# Patient Record
Sex: Male | Born: 1940
Health system: Southern US, Community
[De-identification: ages and names within clinical notes are randomized; demographics above are authoritative.]

## PROBLEM LIST (undated history)

## (undated) DIAGNOSIS — J439 Emphysema, unspecified: Secondary | ICD-10-CM

## (undated) DIAGNOSIS — D509 Iron deficiency anemia, unspecified: Secondary | ICD-10-CM

## (undated) DIAGNOSIS — S32040A Wedge compression fracture of fourth lumbar vertebra, initial encounter for closed fracture: Secondary | ICD-10-CM

## (undated) DIAGNOSIS — D4959 Neoplasm of unspecified behavior of other genitourinary organ: Secondary | ICD-10-CM

## (undated) DIAGNOSIS — I872 Venous insufficiency (chronic) (peripheral): Secondary | ICD-10-CM

## (undated) DIAGNOSIS — F17210 Nicotine dependence, cigarettes, uncomplicated: Secondary | ICD-10-CM

## (undated) DIAGNOSIS — G8929 Other chronic pain: Secondary | ICD-10-CM

## (undated) DIAGNOSIS — F1120 Opioid dependence, uncomplicated: Secondary | ICD-10-CM

## (undated) DIAGNOSIS — R600 Localized edema: Secondary | ICD-10-CM

## (undated) DIAGNOSIS — D469 Myelodysplastic syndrome, unspecified: Secondary | ICD-10-CM

## (undated) DIAGNOSIS — N200 Calculus of kidney: Secondary | ICD-10-CM

## (undated) DIAGNOSIS — M545 Low back pain, unspecified: Secondary | ICD-10-CM

## (undated) HISTORY — DX: Wedge compression fracture of fourth lumbar vertebra, initial encounter for closed fracture: S32.040A

## (undated) HISTORY — PX: SKIN CANCER EXCISION: SHX779

## (undated) HISTORY — PX: COLONOSCOPY: SHX174

---

## 1998-11-11 ENCOUNTER — Emergency Department (HOSPITAL_COMMUNITY): Admission: EM | Admit: 1998-11-11 | Discharge: 1998-11-11 | Payer: Self-pay | Admitting: Emergency Medicine

## 2016-01-12 LAB — HM COLONOSCOPY

## 2016-01-17 DIAGNOSIS — Z86010 Personal history of colonic polyps: Secondary | ICD-10-CM | POA: Diagnosis not present

## 2016-01-17 DIAGNOSIS — K5730 Diverticulosis of large intestine without perforation or abscess without bleeding: Secondary | ICD-10-CM | POA: Diagnosis not present

## 2016-01-17 DIAGNOSIS — D124 Benign neoplasm of descending colon: Secondary | ICD-10-CM | POA: Diagnosis not present

## 2016-02-05 DIAGNOSIS — Z08 Encounter for follow-up examination after completed treatment for malignant neoplasm: Secondary | ICD-10-CM | POA: Diagnosis not present

## 2016-02-05 DIAGNOSIS — L570 Actinic keratosis: Secondary | ICD-10-CM | POA: Diagnosis not present

## 2016-02-05 DIAGNOSIS — Z85828 Personal history of other malignant neoplasm of skin: Secondary | ICD-10-CM | POA: Diagnosis not present

## 2016-08-07 DIAGNOSIS — C44519 Basal cell carcinoma of skin of other part of trunk: Secondary | ICD-10-CM | POA: Diagnosis not present

## 2016-08-07 DIAGNOSIS — L570 Actinic keratosis: Secondary | ICD-10-CM | POA: Diagnosis not present

## 2016-08-07 DIAGNOSIS — D485 Neoplasm of uncertain behavior of skin: Secondary | ICD-10-CM | POA: Diagnosis not present

## 2016-08-07 DIAGNOSIS — Z08 Encounter for follow-up examination after completed treatment for malignant neoplasm: Secondary | ICD-10-CM | POA: Diagnosis not present

## 2016-08-07 DIAGNOSIS — Z85828 Personal history of other malignant neoplasm of skin: Secondary | ICD-10-CM | POA: Diagnosis not present

## 2016-08-13 DIAGNOSIS — C44519 Basal cell carcinoma of skin of other part of trunk: Secondary | ICD-10-CM | POA: Diagnosis not present

## 2017-02-05 DIAGNOSIS — L905 Scar conditions and fibrosis of skin: Secondary | ICD-10-CM | POA: Diagnosis not present

## 2017-02-05 DIAGNOSIS — C44529 Squamous cell carcinoma of skin of other part of trunk: Secondary | ICD-10-CM | POA: Diagnosis not present

## 2017-02-05 DIAGNOSIS — Z85828 Personal history of other malignant neoplasm of skin: Secondary | ICD-10-CM | POA: Diagnosis not present

## 2017-02-05 DIAGNOSIS — C44729 Squamous cell carcinoma of skin of left lower limb, including hip: Secondary | ICD-10-CM | POA: Diagnosis not present

## 2017-02-05 DIAGNOSIS — L570 Actinic keratosis: Secondary | ICD-10-CM | POA: Diagnosis not present

## 2017-02-05 DIAGNOSIS — D485 Neoplasm of uncertain behavior of skin: Secondary | ICD-10-CM | POA: Diagnosis not present

## 2017-02-17 DIAGNOSIS — C44529 Squamous cell carcinoma of skin of other part of trunk: Secondary | ICD-10-CM | POA: Diagnosis not present

## 2017-02-17 DIAGNOSIS — C44729 Squamous cell carcinoma of skin of left lower limb, including hip: Secondary | ICD-10-CM | POA: Diagnosis not present

## 2017-03-17 DIAGNOSIS — L905 Scar conditions and fibrosis of skin: Secondary | ICD-10-CM | POA: Diagnosis not present

## 2017-03-17 DIAGNOSIS — Z08 Encounter for follow-up examination after completed treatment for malignant neoplasm: Secondary | ICD-10-CM | POA: Diagnosis not present

## 2017-03-17 DIAGNOSIS — Z85828 Personal history of other malignant neoplasm of skin: Secondary | ICD-10-CM | POA: Diagnosis not present

## 2017-08-07 DIAGNOSIS — D485 Neoplasm of uncertain behavior of skin: Secondary | ICD-10-CM | POA: Diagnosis not present

## 2017-08-07 DIAGNOSIS — L905 Scar conditions and fibrosis of skin: Secondary | ICD-10-CM | POA: Diagnosis not present

## 2017-08-07 DIAGNOSIS — C44329 Squamous cell carcinoma of skin of other parts of face: Secondary | ICD-10-CM | POA: Diagnosis not present

## 2017-08-07 DIAGNOSIS — Z08 Encounter for follow-up examination after completed treatment for malignant neoplasm: Secondary | ICD-10-CM | POA: Diagnosis not present

## 2017-08-07 DIAGNOSIS — Z85828 Personal history of other malignant neoplasm of skin: Secondary | ICD-10-CM | POA: Diagnosis not present

## 2017-08-20 DIAGNOSIS — C44329 Squamous cell carcinoma of skin of other parts of face: Secondary | ICD-10-CM | POA: Diagnosis not present

## 2017-11-20 ENCOUNTER — Other Ambulatory Visit: Payer: Self-pay | Admitting: Chiropractic Medicine

## 2017-11-20 DIAGNOSIS — M545 Low back pain: Secondary | ICD-10-CM

## 2017-11-21 ENCOUNTER — Ambulatory Visit
Admission: RE | Admit: 2017-11-21 | Discharge: 2017-11-21 | Disposition: A | Discharge status: Home / Self Care | Payer: Medicare Other | Source: Ambulatory Visit | Attending: Chiropractic Medicine | Admitting: Chiropractic Medicine

## 2017-11-21 DIAGNOSIS — M5136 Other intervertebral disc degeneration, lumbar region: Secondary | ICD-10-CM | POA: Diagnosis not present

## 2017-11-21 DIAGNOSIS — M461 Sacroiliitis, not elsewhere classified: Secondary | ICD-10-CM | POA: Insufficient documentation

## 2017-11-21 DIAGNOSIS — M545 Low back pain: Secondary | ICD-10-CM | POA: Diagnosis not present

## 2017-11-21 DIAGNOSIS — M47816 Spondylosis without myelopathy or radiculopathy, lumbar region: Secondary | ICD-10-CM | POA: Diagnosis not present

## 2017-11-21 DIAGNOSIS — M4856XA Collapsed vertebra, not elsewhere classified, lumbar region, initial encounter for fracture: Secondary | ICD-10-CM | POA: Diagnosis not present

## 2017-12-01 ENCOUNTER — Telehealth: Payer: Self-pay | Admitting: Internal Medicine

## 2017-12-01 NOTE — Telephone Encounter (Signed)
Copied from [place] [id number]. Topic: Appointment Scheduling - Scheduling Inquiry for Clinic >> Dec 01, 2017  8:31 AM Darl Householder, RMA wrote: Reason for CRM: patient is requesting a emergency new pt appt but there is availability currently this week, pt states he needs a prescription for prednisone, and needs to establish care, pt states he knows Dr. Silvio Pate personally and would like a call back at [phone]

## 2017-12-02 NOTE — Telephone Encounter (Signed)
Okay to add him tomorrow at 12:15 if he can make it then

## 2017-12-02 NOTE — Telephone Encounter (Signed)
Pt aware.

## 2017-12-02 NOTE — Telephone Encounter (Signed)
12:15 has been taken do you still want to add him

## 2017-12-02 NOTE — Telephone Encounter (Signed)
Left message asking pt to call office  °

## 2017-12-02 NOTE — Telephone Encounter (Signed)
Can patient be worked in this week? See below message

## 2017-12-02 NOTE — Telephone Encounter (Signed)
Okay to try 12:30---just tell him I won't have as long as usual due to fitting him in quickly

## 2017-12-03 ENCOUNTER — Encounter: Payer: Self-pay | Admitting: Internal Medicine

## 2017-12-03 ENCOUNTER — Ambulatory Visit: Payer: Medicare Other | Admitting: Internal Medicine

## 2017-12-03 VITALS — BP 124/82 | HR 77 | Temp 98.1°F | Wt 142.0 lb

## 2017-12-03 DIAGNOSIS — R609 Edema, unspecified: Secondary | ICD-10-CM | POA: Insufficient documentation

## 2017-12-03 DIAGNOSIS — S32040A Wedge compression fracture of fourth lumbar vertebra, initial encounter for closed fracture: Secondary | ICD-10-CM | POA: Insufficient documentation

## 2017-12-03 DIAGNOSIS — I872 Venous insufficiency (chronic) (peripheral): Secondary | ICD-10-CM | POA: Insufficient documentation

## 2017-12-03 LAB — COMPREHENSIVE METABOLIC PANEL
ALK PHOS: 127 U/L — AB (ref 39–117)
ALT: 13 U/L (ref 0–53)
AST: 14 U/L (ref 0–37)
Albumin: 4.1 g/dL (ref 3.5–5.2)
BILIRUBIN TOTAL: 0.9 mg/dL (ref 0.2–1.2)
BUN: 18 mg/dL (ref 6–23)
CO2: 30 meq/L (ref 19–32)
CREATININE: 0.71 mg/dL (ref 0.40–1.50)
Calcium: 9.2 mg/dL (ref 8.4–10.5)
Chloride: 99 mEq/L (ref 96–112)
GFR: 114.26 mL/min (ref >60.00)
Glucose, Bld: 116 mg/dL — ABNORMAL HIGH (ref 70–99)
Potassium: 4.1 mEq/L (ref 3.5–5.1)
Sodium: 134 mEq/L — ABNORMAL LOW (ref 135–145)
TOTAL PROTEIN: 6.8 g/dL (ref 6.0–8.3)

## 2017-12-03 LAB — CBC
HEMATOCRIT: 31 % — AB (ref 39.0–52.0)
Hemoglobin: 10.4 g/dL — ABNORMAL LOW (ref 13.0–17.0)
MCHC: 33.6 g/dL (ref 30.0–36.0)
MCV: 101.8 fl — AB (ref 78.0–100.0)
Platelets: 285 10*3/uL (ref 150.0–400.0)
RBC: 3.05 Mil/uL — ABNORMAL LOW (ref 4.22–5.81)
RDW: 22.7 % — AB (ref 11.5–15.5)
WBC: 9.2 10*3/uL (ref 4.0–10.5)

## 2017-12-03 LAB — T4, FREE: Free T4: 1.03 ng/dL (ref 0.60–1.60)

## 2017-12-03 MED ORDER — TRAMADOL HCL 50 MG PO TABS: 50.0000 mg | ORAL_TABLET | Freq: Three times a day (TID) | ORAL | 0 refills | Status: DC | PRN

## 2017-12-03 NOTE — Assessment & Plan Note (Signed)
And 3rd Sudden pain 2 months ago when shoveling---but doesn't seem muscular and no sciatic---so may have had the fracture then Concern about the ibuprofen Will change to regular tylenol and prn tramadol Check DEXA Start vitamin D/calcium
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
## 2017-12-03 NOTE — Patient Instructions (Signed)
Please start generic tylenol arthritis three times a day and stop the ibuprofen for now. You can take the stronger pain medication (tramadol) on your bad days. Start tums and vitamin D (over the counter)

## 2017-12-05 ENCOUNTER — Telehealth: Payer: Self-pay

## 2017-12-05 NOTE — Telephone Encounter (Signed)
Left another message for the pt to call. I need to speak to him directly. Thanks

## 2017-12-05 NOTE — Telephone Encounter (Signed)
Copied from [place]. Topic: Inquiry >> Dec 04, 2017  5:44 PM Oliver Pila B wrote: Reason for CRM: pt called the office to speak w/ Larene Beach, pt will call the office tomorrow

## 2017-12-09 ENCOUNTER — Telehealth: Payer: Self-pay | Admitting: Internal Medicine

## 2017-12-09 NOTE — Telephone Encounter (Signed)
Please let him know that you don't need an MRI to see cancer in a bone---it is usually apparent in just a plain film---which he had a couple of weeks ago. If he wants, we can recheck this to be sure it hasn't worsened

## 2017-12-09 NOTE — Telephone Encounter (Signed)
He said his back pain varies from day to day. His biggest issue is getting up in the mornings or to get up from sitting for a long time. Stephen Bennett he has to think before getting up because he doesn't want to cause any pain. Does not want to take pain medication. He has been in this pain for 2 months now.  He is thinking he may need to be seen before his appointment 01-05-18.

## 2017-12-09 NOTE — Telephone Encounter (Signed)
Copied from [place] [id number]. Topic: Quick Communication - See Telephone Encounter >> Dec 09, 2017  9:07 AM Percell Belt A wrote: CRM for notification. See Telephone encounter for:  pt called in and said that he is in a lot of pain and he would like to know If Dr Silvio Pate could order a MRI on his back to see what is going on.  He would like to have it ASAP.  He said that his mother had a tumor in her back that ended up being cancer and he would like to get that checked out to just make sure   Best number [phone]  12/09/17.

## 2017-12-09 NOTE — Telephone Encounter (Signed)
Pt seen 12/03/17.

## 2017-12-10 NOTE — Telephone Encounter (Signed)
This could either be related to the vetebral fracture, or perhaps sciatica as well. Okay to schedule him sooner if he wishes---but if he doesn't want medications, not sure what else I can do. If he would like, I can refer him for PT now---and keep next month's appt

## 2017-12-10 NOTE — Telephone Encounter (Signed)
Spoke to pt. Cancelled the 01-05-18 appt and moved it to 12-19-17

## 2017-12-10 NOTE — Telephone Encounter (Signed)
Spoke to pt. He wants to be seen sooner. I will call him back this afternoon to schedule his appt

## 2017-12-17 ENCOUNTER — Ambulatory Visit: Payer: Medicare Other | Admitting: Internal Medicine

## 2017-12-17 ENCOUNTER — Encounter: Payer: Self-pay | Admitting: Internal Medicine

## 2017-12-17 VITALS — BP 104/70 | HR 81 | Temp 97.7°F | Wt 139.0 lb

## 2017-12-17 DIAGNOSIS — M545 Low back pain, unspecified: Secondary | ICD-10-CM | POA: Insufficient documentation

## 2017-12-17 DIAGNOSIS — G8929 Other chronic pain: Secondary | ICD-10-CM | POA: Diagnosis not present

## 2017-12-17 MED ORDER — TRAMADOL HCL 50 MG PO TABS: 50.0000 mg | ORAL_TABLET | Freq: Three times a day (TID) | ORAL | 0 refills | Status: DC | PRN

## 2017-12-17 NOTE — Progress Notes (Signed)
   Subjective:    Patient ID: Stephen Bennett, male    DOB: 1941-07-10, 77 y.o.   MRN: [id number]  HPI Here for worsening back pain  Still with some ankle swelling--especially on left Left low back pain---occasionally over to the right Hardest time is getting out of bed--needs to use hands and arms to get up Using tramadol first in AM--and this helps. Occasionally takes a second No radiation to legs--but may get slight "feeling" in upper left thigh No leg weakness  Did start milk again and tums And vitamin D   Review of Systems No loss of control in bowel or bladder Using miralax for constipation    Objective:   Physical Exam  Constitutional: No distress.  HENT:  2 apparent cysts on vertex of head  Musculoskeletal:  Mild tenderness in mid low back--not specifically spinal Very stiff and limited flexion SLR negative bilaterally Normal ROM in both hips  Neurological:  No focal leg weakness Gait is slow and short steps but not antalgic Trouble getting up and down from table and chair           Assessment & Plan:

## 2017-12-17 NOTE — Assessment & Plan Note (Signed)
Now 3 months of pain or so He is very concerned and wants to see specialist (neurosurgeon) but presentation doesn't suggest HNP or spinal stenosis Very stiff but no preceding problems to suggest ankylosing spondylitis Could still have pain from lumbar fracture but also may be more soft tissue Discussed PT but he wants to see a specialist Will set up with physiatry

## 2017-12-18 ENCOUNTER — Telehealth: Payer: Self-pay

## 2017-12-18 NOTE — Telephone Encounter (Signed)
Thank you for the information.

## 2017-12-18 NOTE — Telephone Encounter (Signed)
Copied from [place]. Topic: Inquiry >> Dec 18, 2017  9:06 AM Conception Chancy, NT wrote: Patient states he was seen yesterday 12/17/17 by [facility] and wants to let him know that he has been set up with [facility] for 12/22/17 at 3:45.

## 2017-12-19 ENCOUNTER — Ambulatory Visit: Payer: Medicare Other | Admitting: Internal Medicine

## 2017-12-22 DIAGNOSIS — M5136 Other intervertebral disc degeneration, lumbar region: Secondary | ICD-10-CM | POA: Diagnosis not present

## 2017-12-22 DIAGNOSIS — S32040A Wedge compression fracture of fourth lumbar vertebra, initial encounter for closed fracture: Secondary | ICD-10-CM | POA: Diagnosis not present

## 2018-01-01 ENCOUNTER — Ambulatory Visit
Admission: RE | Admit: 2018-01-01 | Discharge: 2018-01-01 | Disposition: A | Discharge status: Home / Self Care | Payer: Medicare Other | Source: Ambulatory Visit | Attending: Internal Medicine | Admitting: Internal Medicine

## 2018-01-01 DIAGNOSIS — Z13820 Encounter for screening for osteoporosis: Secondary | ICD-10-CM | POA: Insufficient documentation

## 2018-01-01 DIAGNOSIS — M810 Age-related osteoporosis without current pathological fracture: Secondary | ICD-10-CM | POA: Insufficient documentation

## 2018-01-01 DIAGNOSIS — S32040A Wedge compression fracture of fourth lumbar vertebra, initial encounter for closed fracture: Secondary | ICD-10-CM

## 2018-01-05 ENCOUNTER — Ambulatory Visit: Payer: Medicare Other | Admitting: Internal Medicine

## 2018-01-19 DIAGNOSIS — S32040G Wedge compression fracture of fourth lumbar vertebra, subsequent encounter for fracture with delayed healing: Secondary | ICD-10-CM | POA: Diagnosis not present

## 2018-01-19 DIAGNOSIS — S32030G Wedge compression fracture of third lumbar vertebra, subsequent encounter for fracture with delayed healing: Secondary | ICD-10-CM | POA: Diagnosis not present

## 2018-01-19 DIAGNOSIS — M47816 Spondylosis without myelopathy or radiculopathy, lumbar region: Secondary | ICD-10-CM | POA: Diagnosis not present

## 2018-02-03 ENCOUNTER — Encounter: Payer: Self-pay | Admitting: Internal Medicine

## 2018-02-03 ENCOUNTER — Ambulatory Visit: Payer: Medicare Other | Admitting: Internal Medicine

## 2018-02-03 VITALS — BP 108/54 | HR 80 | Temp 98.3°F | Wt 138.2 lb

## 2018-02-03 DIAGNOSIS — R609 Edema, unspecified: Secondary | ICD-10-CM

## 2018-02-03 DIAGNOSIS — D649 Anemia, unspecified: Secondary | ICD-10-CM | POA: Diagnosis not present

## 2018-02-03 DIAGNOSIS — S32040D Wedge compression fracture of fourth lumbar vertebra, subsequent encounter for fracture with routine healing: Secondary | ICD-10-CM

## 2018-02-03 LAB — IBC PANEL
Iron: 92 ug/dL (ref 42–165)
Saturation Ratios: 38.7 % (ref 20.0–50.0)
TRANSFERRIN: 170 mg/dL — AB (ref 212.0–360.0)

## 2018-02-03 LAB — CBC WITH DIFFERENTIAL/PLATELET
BASOS ABS: 0.1 10*3/uL (ref 0.0–0.1)
BASOS PCT: 0.9 % (ref 0.0–3.0)
EOS PCT: 0.5 % (ref 0.0–5.0)
Eosinophils Absolute: 0 10*3/uL (ref 0.0–0.7)
HEMATOCRIT: 30.3 % — AB (ref 39.0–52.0)
Hemoglobin: 10.4 g/dL — ABNORMAL LOW (ref 13.0–17.0)
LYMPHS ABS: 2.5 10*3/uL (ref 0.7–4.0)
LYMPHS PCT: 31.1 % (ref 12.0–46.0)
MCHC: 34.3 g/dL (ref 30.0–36.0)
MCV: 102.5 fl — AB (ref 78.0–100.0)
MONOS PCT: 8.4 % (ref 3.0–12.0)
Monocytes Absolute: 0.7 10*3/uL (ref 0.1–1.0)
NEUTROS ABS: 4.7 10*3/uL (ref 1.4–7.7)
NEUTROS PCT: 59.1 % (ref 43.0–77.0)
PLATELETS: 204 10*3/uL (ref 150.0–400.0)
RBC: 2.96 Mil/uL — ABNORMAL LOW (ref 4.22–5.81)
RDW: 24 % — AB (ref 11.5–15.5)
WBC: 7.9 10*3/uL (ref 4.0–10.5)

## 2018-02-03 LAB — FERRITIN: Ferritin: 695.7 ng/mL — ABNORMAL HIGH (ref 22.0–322.0)

## 2018-02-03 LAB — B12 AND FOLATE PANEL
Folate: 18.5 ng/mL (ref >5.9)
VITAMIN B 12: 606 pg/mL (ref 211–911)

## 2018-02-03 MED ORDER — TRAMADOL HCL 50 MG PO TABS: 50.0000 mg | ORAL_TABLET | Freq: Three times a day (TID) | ORAL | 0 refills | Status: DC | PRN

## 2018-02-03 NOTE — Progress Notes (Signed)
Subjective:    Patient ID: Stephen Bennett, male    DOB: 16-Jul-1941, 77 y.o.   MRN: [id number]  HPI Here for follow up of severe back pain This has improved--not as bad Still gets sore and stiff with sitting---across upper lumbar areas Bad pain along the sides is better  Using the tramadol first thing in the morning ---or 1/2 Not using any the rest of the day Is on the calcium  Ongoing ankle edema Does tend to be better in the morning Able to get back out to do some yard work--so on his feet more Breathing is okay No chest pain  Current Outpatient Medications on File Prior to Visit  Medication Sig Dispense Refill  . calcium carbonate (TUMS EX) 750 MG chewable tablet Chew 2 tablets by mouth daily.    . cholecalciferol (VITAMIN D) 1000 units tablet Take 1,000 Units by mouth daily.    . traMADol (ULTRAM) 50 MG tablet Take 1 tablet (50 mg total) by mouth 3 (three) times daily as needed. 60 tablet 0  . acetaminophen (TYLENOL) 650 MG CR tablet Take 650 mg by mouth 3 (three) times daily.     No current facility-administered medications on file prior to visit.     No Known Allergies  Past Medical History:  Diagnosis Date  . Compression fracture of fourth lumbar vertebra [facility])     Past Surgical History:  Procedure Laterality Date  . SKIN CANCER EXCISION     several    Family History  Problem Relation Age of Onset  . Cancer Mother   . Heart disease Neg Hx   . Diabetes Neg Hx     Social History   Socioeconomic History  . Marital status: Divorced    Spouse name: Not on file  . Number of children: 0  . Years of education: Not on file  . Highest education level: Not on file  Occupational History  . Occupation: Therapist, art    Comment: Retired  . 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 Financial resource strain: Not on file  . Food insecurity:    Worry: Not on file    Inability: Not on file  . Transportation needs:    Medical: Not on file      Non-medical: Not on file  Tobacco Use  . Smoking status: Light Tobacco Smoker  . Smokeless tobacco: Never Used  Substance and Sexual Activity  . Alcohol use: Not on file  . Drug use: Not on file  . Sexual activity: Not on file  Lifestyle  . Physical activity:    Days per week: Not on file    Minutes per session: Not on file  . Stress: Not on file  Relationships  . Social connections:    Talks on phone: Not on file    Gets together: Not on file    Attends religious service: Not on file    Active member of club or organization: Not on file    Attends meetings of clubs or organizations: Not on file    Relationship status: Not on file  . Intimate partner violence:    Fear of current or ex partner: Not on file    Emotionally abused: Not on file    Physically abused: Not on file    Forced sexual activity: Not on file  Other Topics Concern  . Not on file  Social History Narrative   No living will   Would want neighbor Marlowe Kays  [employer] to make decisions   Would accept resuscitation   Review of Systems Using ensure regularly Weight is stable Sleeps okay--goes to bed late, but still wakes early to void Has some sores on back of head--may be related to hitting it on recliner Bowels better with prunes    Objective:   Physical Exam  Constitutional: He appears well-developed. No distress.  Neck: No thyromegaly present.  Cardiovascular: Normal rate, regular rhythm and normal heart sounds. Exam reveals no gallop.  No murmur heard. Pulmonary/Chest: Effort normal and breath sounds normal. No respiratory distress. He has no wheezes. He has no rales.  Abdominal: Soft. He exhibits no distension. There is no tenderness. There is no rebound and no guarding.  Musculoskeletal:  1+ edema in feet/ankiles and distal calves  Lymphadenopathy:    He has no cervical adenopathy.          Assessment & Plan:

## 2018-02-03 NOTE — Assessment & Plan Note (Signed)
Will check labs Did have benign colon in 2017 Will decide what else to do once the labs come back

## 2018-02-03 NOTE — Assessment & Plan Note (Signed)
Reviewed history He eats a lot of soup and V8 Discussed low salt eating He doesn't wear socks Discussed that he would be better off without diuretic at this point

## 2018-02-03 NOTE — Patient Instructions (Signed)

## 2018-02-03 NOTE — Assessment & Plan Note (Signed)
Back pain is much better Did see physiatry Has the tramadol for AM and prn

## 2018-02-03 NOTE — Addendum Note (Signed)
Addended by: Ellamae Sia on: 02/03/2018 01:07 PM   Modules accepted: Orders

## 2018-02-05 DIAGNOSIS — L570 Actinic keratosis: Secondary | ICD-10-CM | POA: Diagnosis not present

## 2018-02-05 DIAGNOSIS — L011 Impetiginization of other dermatoses: Secondary | ICD-10-CM | POA: Diagnosis not present

## 2018-02-05 DIAGNOSIS — L720 Epidermal cyst: Secondary | ICD-10-CM | POA: Diagnosis not present

## 2018-02-05 DIAGNOSIS — L820 Inflamed seborrheic keratosis: Secondary | ICD-10-CM | POA: Diagnosis not present

## 2018-02-16 ENCOUNTER — Telehealth: Payer: Self-pay | Admitting: Internal Medicine

## 2018-02-16 MED ORDER — FUROSEMIDE 20 MG PO TABS: 20.0000 mg | ORAL_TABLET | Freq: Every day | ORAL | 1 refills | Status: DC | PRN

## 2018-02-16 NOTE — Telephone Encounter (Signed)
He wrote me note about ongoing concerns with his leg swelling. Really wants to try something for this Will Rx furosemide in low dose  Please call him and let him know I sent the prescription for him. He should use it sparingly and only on days when his legs are very swollen first thing in the morning (not later in the day)

## 2018-02-17 NOTE — Telephone Encounter (Signed)
Spoke to pt. He said his legs are better in the mornings. He has cut back his sodium intake. They seem to be a little better.

## 2018-03-16 ENCOUNTER — Ambulatory Visit: Payer: Medicare Other | Admitting: Internal Medicine

## 2018-03-19 DIAGNOSIS — L570 Actinic keratosis: Secondary | ICD-10-CM | POA: Diagnosis not present

## 2018-03-19 DIAGNOSIS — B078 Other viral warts: Secondary | ICD-10-CM | POA: Diagnosis not present

## 2018-03-19 DIAGNOSIS — L820 Inflamed seborrheic keratosis: Secondary | ICD-10-CM | POA: Diagnosis not present

## 2018-03-19 DIAGNOSIS — L578 Other skin changes due to chronic exposure to nonionizing radiation: Secondary | ICD-10-CM | POA: Diagnosis not present

## 2018-04-02 DIAGNOSIS — B078 Other viral warts: Secondary | ICD-10-CM | POA: Diagnosis not present

## 2018-04-02 DIAGNOSIS — L578 Other skin changes due to chronic exposure to nonionizing radiation: Secondary | ICD-10-CM | POA: Diagnosis not present

## 2018-04-02 DIAGNOSIS — C44722 Squamous cell carcinoma of skin of right lower limb, including hip: Secondary | ICD-10-CM | POA: Diagnosis not present

## 2018-04-02 DIAGNOSIS — L011 Impetiginization of other dermatoses: Secondary | ICD-10-CM | POA: Diagnosis not present

## 2018-04-02 DIAGNOSIS — C4492 Squamous cell carcinoma of skin, unspecified: Secondary | ICD-10-CM

## 2018-04-02 HISTORY — DX: Squamous cell carcinoma of skin, unspecified: C44.92

## 2018-05-07 ENCOUNTER — Encounter: Payer: Self-pay | Admitting: Internal Medicine

## 2018-05-07 ENCOUNTER — Ambulatory Visit: Payer: Medicare Other | Admitting: Internal Medicine

## 2018-05-07 VITALS — BP 104/66 | HR 69 | Temp 97.9°F | Ht 68.0 in | Wt 132.0 lb

## 2018-05-07 DIAGNOSIS — M810 Age-related osteoporosis without current pathological fracture: Secondary | ICD-10-CM | POA: Diagnosis not present

## 2018-05-07 DIAGNOSIS — D649 Anemia, unspecified: Secondary | ICD-10-CM | POA: Diagnosis not present

## 2018-05-07 DIAGNOSIS — S32040D Wedge compression fracture of fourth lumbar vertebra, subsequent encounter for fracture with routine healing: Secondary | ICD-10-CM | POA: Diagnosis not present

## 2018-05-07 DIAGNOSIS — R609 Edema, unspecified: Secondary | ICD-10-CM | POA: Diagnosis not present

## 2018-05-07 DIAGNOSIS — R140 Abdominal distension (gaseous): Secondary | ICD-10-CM | POA: Insufficient documentation

## 2018-05-07 LAB — RENAL FUNCTION PANEL
Albumin: 4 g/dL (ref 3.5–5.2)
BUN: 13 mg/dL (ref 6–23)
CHLORIDE: 102 meq/L (ref 96–112)
CO2: 28 mEq/L (ref 19–32)
Calcium: 8.9 mg/dL (ref 8.4–10.5)
Creatinine, Ser: 0.77 mg/dL (ref 0.40–1.50)
GFR: 103.93 mL/min (ref >60.00)
Glucose, Bld: 102 mg/dL — ABNORMAL HIGH (ref 70–99)
PHOSPHORUS: 2.9 mg/dL (ref 2.3–4.6)
Potassium: 3.9 mEq/L (ref 3.5–5.1)
SODIUM: 134 meq/L — AB (ref 135–145)

## 2018-05-07 LAB — CBC WITH DIFFERENTIAL/PLATELET
BASOS PCT: 0.8 % (ref 0.0–3.0)
Basophils Absolute: 0.1 10*3/uL (ref 0.0–0.1)
EOS ABS: 0 10*3/uL (ref 0.0–0.7)
EOS PCT: 0.3 % (ref 0.0–5.0)
HEMATOCRIT: 30.5 % — AB (ref 39.0–52.0)
HEMOGLOBIN: 10.4 g/dL — AB (ref 13.0–17.0)
LYMPHS PCT: 32.5 % (ref 12.0–46.0)
Lymphs Abs: 2.4 10*3/uL (ref 0.7–4.0)
MCHC: 34.1 g/dL (ref 30.0–36.0)
MCV: 102.5 fl — ABNORMAL HIGH (ref 78.0–100.0)
MONOS PCT: 9.7 % (ref 3.0–12.0)
Monocytes Absolute: 0.7 10*3/uL (ref 0.1–1.0)
NEUTROS PCT: 56.7 % (ref 43.0–77.0)
Neutro Abs: 4.3 10*3/uL (ref 1.4–7.7)
PLATELETS: 207 10*3/uL (ref 150.0–400.0)
RBC: 2.98 Mil/uL — ABNORMAL LOW (ref 4.22–5.81)
RDW: 22.6 % — ABNORMAL HIGH (ref 11.5–15.5)
WBC: 7.5 10*3/uL (ref 4.0–10.5)

## 2018-05-07 NOTE — Assessment & Plan Note (Signed)
Improved with decreased salt in diet

## 2018-05-07 NOTE — Assessment & Plan Note (Signed)
On calcium and vitamin D Will hold off on bisphosphonate Consider repeat in 2 years

## 2018-05-07 NOTE — Patient Instructions (Addendum)
Please change to a plain vitamin D 1000 tablet daily and 2-3 tums daily. You can try over the counter simethicone for the gas Cut back on fruit juice (and you may want to decrease the prunes)

## 2018-05-07 NOTE — Assessment & Plan Note (Signed)
Pain is better now

## 2018-05-07 NOTE — Assessment & Plan Note (Signed)
Will recheck labs If worse---to hematology

## 2018-05-07 NOTE — Progress Notes (Signed)
Subjective:    Patient ID: Stephen Bennett, male    DOB: [birth year], 77 y.o.   MRN: [id number]  HPI Here for follow up of lumbar fracture and edema  Overall better--but gets lots of gas build up Burps a lot No heartburn or cramps Relates to what he eats--will bloat at times Tries hot coffee--but hasn't tried simethicone Taking calcium with D and tums  Edema is much better Is avoiding salt now No chest pain or SOB  Back pain is much better  Current Outpatient Medications on File Prior to Visit  Medication Sig Dispense Refill  . CALCIUM PO Take by mouth.    . cholecalciferol (VITAMIN D) 1000 units tablet Take 1,000 Units by mouth daily.    . furosemide (LASIX) 20 MG tablet Take 1 tablet (20 mg total) by mouth daily as needed. For increased morning leg swelling 30 tablet 1   No current facility-administered medications on file prior to visit.     No Known Allergies  Past Medical History:  Diagnosis Date  . Compression fracture of fourth lumbar vertebra [facility])     Past Surgical History:  Procedure Laterality Date  . SKIN CANCER EXCISION     several    Family History  Problem Relation Age of Onset  . Cancer Mother   . Heart disease Neg Hx   . Diabetes Neg Hx     Social History   Socioeconomic History  . Marital status: Divorced    Spouse name: Not on file  . Number of children: 0  . Years of education: Not on file  . Highest education level: Not on file  Occupational History  . Occupation: Therapist, art    Comment: Retired  . 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 Financial resource strain: Not on file  . Food insecurity:    Worry: Not on file    Inability: Not on file  . Transportation needs:    Medical: Not on file    Non-medical: Not on file  Tobacco Use  . Smoking status: Light Tobacco Smoker  . Smokeless tobacco: Never Used  Substance and Sexual Activity  . Alcohol use: Not on file  . Drug use: Not on file  . Sexual  activity: Not on file  Lifestyle  . Physical activity:    Days per week: Not on file    Minutes per session: Not on file  . Stress: Not on file  Relationships  . Social connections:    Talks on phone: Not on file    Gets together: Not on file    Attends religious service: Not on file    Active member of club or organization: Not on file    Attends meetings of clubs or organizations: Not on file    Relationship status: Not on file  . Intimate partner violence:    Fear of current or ex partner: Not on file    Emotionally abused: Not on file    Physically abused: Not on file    Forced sexual activity: Not on file  Other Topics Concern  . Not on file  Social History Narrative   No living will   Would want neighbor [name] to make decisions   Would accept resuscitation   Review of Systems Sleeps okay Back to working in yard--walks some Eating prunes now--helps with his bowels    Objective:   Physical Exam  Constitutional: No distress.  Neck: No thyromegaly present.  Cardiovascular:  Normal rate, regular rhythm and normal heart sounds. Exam reveals no gallop.  No murmur heard. Respiratory: Effort normal and breath sounds normal. No respiratory distress. He has no wheezes. He has no rales.  GI: Soft. Bowel sounds are normal. He exhibits no distension and no mass. There is no tenderness. There is no rebound and no guarding.  Musculoskeletal:  Trace edema  Lymphadenopathy:    He has no cervical adenopathy.           Assessment & Plan:

## 2018-05-07 NOTE — Assessment & Plan Note (Signed)
Change calcium tab to tums Simethicone prn Info given on low FODMAP eating  He is drinking fruit juice and prunes Asked him to cut back on this

## 2018-05-12 ENCOUNTER — Telehealth: Payer: Self-pay

## 2018-05-12 NOTE — Telephone Encounter (Signed)
Copied from [place] [id number]. Topic: General - Other >> May 12, 2018  2:33 PM Carolyn Stare wrote:  Stephen Bennett; Pt wanted Dr Silvio Pate know that the medicine he recommended for his stomach is working .

## 2018-05-13 NOTE — Telephone Encounter (Signed)
Probably referring to the simethicone Good to hear!

## 2018-06-11 DIAGNOSIS — L570 Actinic keratosis: Secondary | ICD-10-CM | POA: Diagnosis not present

## 2018-06-11 DIAGNOSIS — L820 Inflamed seborrheic keratosis: Secondary | ICD-10-CM | POA: Diagnosis not present

## 2018-06-11 DIAGNOSIS — L578 Other skin changes due to chronic exposure to nonionizing radiation: Secondary | ICD-10-CM | POA: Diagnosis not present

## 2018-06-11 DIAGNOSIS — D485 Neoplasm of uncertain behavior of skin: Secondary | ICD-10-CM | POA: Diagnosis not present

## 2018-06-11 DIAGNOSIS — C44319 Basal cell carcinoma of skin of other parts of face: Secondary | ICD-10-CM | POA: Diagnosis not present

## 2018-06-11 DIAGNOSIS — C4491 Basal cell carcinoma of skin, unspecified: Secondary | ICD-10-CM

## 2018-06-11 HISTORY — DX: Basal cell carcinoma of skin, unspecified: C44.91

## 2018-10-13 DIAGNOSIS — C44319 Basal cell carcinoma of skin of other parts of face: Secondary | ICD-10-CM | POA: Diagnosis not present

## 2018-11-19 ENCOUNTER — Ambulatory Visit (INDEPENDENT_AMBULATORY_CARE_PROVIDER_SITE_OTHER): Payer: Medicare Other | Admitting: Internal Medicine

## 2018-11-19 ENCOUNTER — Encounter: Payer: Self-pay | Admitting: Internal Medicine

## 2018-11-19 VITALS — BP 110/64 | HR 56 | Temp 97.5°F | Ht 66.5 in | Wt 136.0 lb

## 2018-11-19 DIAGNOSIS — D649 Anemia, unspecified: Secondary | ICD-10-CM

## 2018-11-19 DIAGNOSIS — I499 Cardiac arrhythmia, unspecified: Secondary | ICD-10-CM | POA: Diagnosis not present

## 2018-11-19 DIAGNOSIS — R609 Edema, unspecified: Secondary | ICD-10-CM

## 2018-11-19 DIAGNOSIS — Z0000 Encounter for general adult medical examination without abnormal findings: Secondary | ICD-10-CM | POA: Diagnosis not present

## 2018-11-19 DIAGNOSIS — Z7189 Other specified counseling: Secondary | ICD-10-CM

## 2018-11-19 DIAGNOSIS — G8929 Other chronic pain: Secondary | ICD-10-CM

## 2018-11-19 DIAGNOSIS — M545 Low back pain: Secondary | ICD-10-CM | POA: Diagnosis not present

## 2018-11-19 DIAGNOSIS — M810 Age-related osteoporosis without current pathological fracture: Secondary | ICD-10-CM

## 2018-11-19 MED ORDER — TRAMADOL HCL 50 MG PO TABS: 50.0000 mg | ORAL_TABLET | Freq: Three times a day (TID) | ORAL | 0 refills | Status: DC | PRN

## 2018-11-19 NOTE — Assessment & Plan Note (Signed)
Chronic  No action due to stability

## 2018-11-19 NOTE — Progress Notes (Signed)
Subjective:    Patient ID: Stephen Bennett, male    DOB: [birth year], 78 y.o.   MRN: [id number]  HPI Here for Medicare wellness visit and review of chronic health conditions Reviewed form and advanced directives Reviewed other doctors No alcohol Still smokes---discussed quitting again for [name] (and staying off) Tries to do a little exercising Vision is okay Hearing is poor---not interested in hearing aides No falls No depression or anhedonia No major memory issues  Has had recurrent spots on his left cheek---derm would do cryotherapy They continued to recur--so Dr [name] did biopsy showed actinic (and possible treated [facility]) Then got surgery ----biopsy stated [facility] Now may need to do the second one (just actinic though so he is reluctant to have more surgery). Some question that it was just seb keratosis  Back is mostly better Has some pain in the morning (depending on where he sleeps---just sleeps on mattress in den when it is cold and this is not as comfortable) Uses 1/2 tramadol prn  Still on vitamin D and tums Not excited about osteoporosis meds  No recent edema Generally hasn't been taking the furosemide  Bloating is better now  Anemia has been stable on last checks  Current Outpatient Medications on File Prior to Visit  Medication Sig Dispense Refill  . calcium carbonate (TUMS - DOSED IN MG ELEMENTAL CALCIUM) 500 MG chewable tablet Chew 2-3 tablets by mouth daily.    . cholecalciferol (VITAMIN D) 1000 units tablet Take 1,000 Units by mouth daily.    . furosemide (LASIX) 20 MG tablet Take 1 tablet (20 mg total) by mouth daily as needed. For increased morning leg swelling 30 tablet 1  . traMADol (ULTRAM) 50 MG tablet Take 1 tablet by mouth 3 (three) times daily as needed.     No current facility-administered medications on file prior to visit.     No Known Allergies  Past Medical History:  Diagnosis Date  . Compression fracture of fourth lumbar vertebra [facility])      Past Surgical History:  Procedure Laterality Date  . SKIN CANCER EXCISION     several    Family History  Problem Relation Age of Onset  . Cancer Mother   . Heart disease Neg Hx   . Diabetes Neg Hx     Social History   Socioeconomic History  . Marital status: Divorced    Spouse name: Not on file  . Number of children: 0  . Years of education: Not on file  . Highest education level: Not on file  Occupational History  . Occupation: Therapist, art    Comment: Retired  . 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 Financial resource strain: Not on file  . Food insecurity:    Worry: Not on file    Inability: Not on file  . Transportation needs:    Medical: Not on file    Non-medical: Not on file  Tobacco Use  . Smoking status: Light Tobacco Smoker  . Smokeless tobacco: Never Used  Substance and Sexual Activity  . Alcohol use: Not on file  . Drug use: Not on file  . Sexual activity: Not on file  Lifestyle  . Physical activity:    Days per week: Not on file    Minutes per session: Not on file  . Stress: Not on file  Relationships  . Social connections:    Talks on phone: Not on file    Gets together: Not on file  Attends religious service: Not on file    Active member of club or organization: Not on file    Attends meetings of clubs or organizations: Not on file    Relationship status: Not on file  . Intimate partner violence:    Fear of current or ex partner: Not on file    Emotionally abused: Not on file    Physically abused: Not on file    Forced sexual activity: Not on file  Other Topics Concern  . Not on file  Social History Narrative   No living will   Would want neighbor [name] to make decisions   Would accept resuscitation   Review of Systems Appetite is okay Weight has stabilized or is up some Sleeps well Wears seat belt Using miralax for his bowels---keeps him regular. No blood Full dentures No chest pain   No palpitations No dizziness Voids with good stream---slight dribbling (is careful) No other skin problems No heartburn or dysphagia    Objective:   Physical Exam  Constitutional: He is oriented to person, place, and time. He appears well-developed. No distress.  Slight muscle wasting  HENT:  Mouth/Throat: Oropharynx is clear and moist. No oropharyngeal exudate.  Full dentures  Neck: No thyromegaly present.  Cardiovascular: Normal rate, normal heart sounds and intact distal pulses. Exam reveals no gallop.  No murmur heard. Slightly irregular---?bigeminy/trigeminy  Respiratory: Effort normal and breath sounds normal. No respiratory distress. He has no wheezes. He has no rales.  GI: Soft. There is no abdominal tenderness.  Musculoskeletal:     Comments: 1+ ankle edema  Lymphadenopathy:    He has no cervical adenopathy.  Neurological: He is alert and oriented to person, place, and time.  President--- "[name], Bush" [phone] D-l-r-o-w Recall 3/3  Skin: No rash noted.  Psychiatric: He has a normal mood and affect. His behavior is normal.           Assessment & Plan:

## 2018-11-19 NOTE — Progress Notes (Signed)
   Hearing Screening (Inadequate exam)   Method: Audiometry   125Hz  250Hz  500Hz  1000Hz  2000Hz  3000Hz  4000Hz  6000Hz  8000Hz   Right ear:   40 0 40  0    Left ear:   40 0 0  0      Visual Acuity Screening   Right eye Left eye Both eyes  Without correction: 20/30 20/30 20/25   With correction:

## 2018-11-19 NOTE — Assessment & Plan Note (Signed)
Uses 1/2 tramadol rarely

## 2018-11-19 NOTE — Assessment & Plan Note (Addendum)
Will check EKG Shows brady at 48. Looks like multifocal atrial rhythm Possible left atrial enlargement. Normal axis. No ischemic changes No comparison

## 2018-11-19 NOTE — Assessment & Plan Note (Signed)
Mild  Hasn't been using furosemide

## 2018-11-19 NOTE — Assessment & Plan Note (Signed)
On calcium and vitamin D Discussed actonel--he wants to hold off

## 2018-11-19 NOTE — Assessment & Plan Note (Signed)
I have personally reviewed the Medicare Annual Wellness questionnaire and have noted 1. The patient's medical and social history 2. Their use of alcohol, tobacco or illicit drugs 3. Their current medications and supplements 4. The patient's functional ability including ADL's, fall risks, home safety risks and hearing or visual             impairment. 5. Diet and physical activities 6. Evidence for depression or mood disorders  The patients weight, height, BMI and visual acuity have been recorded in the chart I have made referrals, counseling and provided education to the patient based review of the above and I have provided the pt with a written personalized care plan for preventive services.  I have provided you with a copy of your personalized plan for preventive services. Please take the time to review along with your updated medication list.  Prefers no immunizations Discussed stopping smoking---he is reluctant Increase aerobic exercise Colon done 2017--probably done No PSA due to age

## 2018-11-19 NOTE — Assessment & Plan Note (Signed)
See social history Blank forms given 

## 2019-03-23 ENCOUNTER — Other Ambulatory Visit: Payer: Self-pay | Admitting: Internal Medicine

## 2019-03-23 NOTE — Telephone Encounter (Signed)
Last filled 11-19-18 #30 Last OV 11-19-18 Next OV 11-25-19 [facility] [address]

## 2019-09-02 ENCOUNTER — Other Ambulatory Visit: Payer: Self-pay | Admitting: Internal Medicine

## 2019-09-02 NOTE — Telephone Encounter (Signed)
Last filled 03-23-19 #30 Last OV 11-19-18 Next OV 11-25-19 [facility] [address]

## 2019-10-19 ENCOUNTER — Other Ambulatory Visit: Payer: Self-pay | Admitting: Internal Medicine

## 2019-11-25 ENCOUNTER — Encounter: Payer: Self-pay | Admitting: Internal Medicine

## 2019-11-25 ENCOUNTER — Ambulatory Visit (INDEPENDENT_AMBULATORY_CARE_PROVIDER_SITE_OTHER): Payer: Medicare Other | Admitting: Internal Medicine

## 2019-11-25 ENCOUNTER — Other Ambulatory Visit: Payer: Self-pay

## 2019-11-25 VITALS — BP 126/78 | HR 73 | Temp 97.4°F | Ht 66.5 in | Wt 141.1 lb

## 2019-11-25 DIAGNOSIS — J439 Emphysema, unspecified: Secondary | ICD-10-CM | POA: Diagnosis not present

## 2019-11-25 DIAGNOSIS — M545 Low back pain, unspecified: Secondary | ICD-10-CM

## 2019-11-25 DIAGNOSIS — G8929 Other chronic pain: Secondary | ICD-10-CM

## 2019-11-25 DIAGNOSIS — M810 Age-related osteoporosis without current pathological fracture: Secondary | ICD-10-CM

## 2019-11-25 DIAGNOSIS — Z0000 Encounter for general adult medical examination without abnormal findings: Secondary | ICD-10-CM

## 2019-11-25 DIAGNOSIS — R609 Edema, unspecified: Secondary | ICD-10-CM | POA: Diagnosis not present

## 2019-11-25 DIAGNOSIS — Z7189 Other specified counseling: Secondary | ICD-10-CM

## 2019-11-25 MED ORDER — TRAMADOL HCL 50 MG PO TABS: 50.0000 mg | ORAL_TABLET | Freq: Three times a day (TID) | ORAL | 0 refills | Status: DC | PRN

## 2019-11-25 MED ORDER — FUROSEMIDE 20 MG PO TABS: 20.0000 mg | ORAL_TABLET | Freq: Every day | ORAL | 3 refills | Status: DC | PRN

## 2019-11-25 NOTE — Assessment & Plan Note (Signed)
Is on calcium and vitamin D Prefers no other meds for this

## 2019-11-25 NOTE — Assessment & Plan Note (Signed)
See social history----urged him to do this formally since non family member would be his choice

## 2019-11-25 NOTE — Progress Notes (Signed)
Subjective:    Patient ID: Stephen Bennett, male    DOB: 1940/12/14, 79 y.o.   MRN: [id number]  HPI Here for Medicare wellness visit and follow up of chronic health conditions This visit occurred during the SARS-CoV-2 public health emergency.  Safety protocols were in place, including screening questions prior to the visit, additional usage of staff PPE, and extensive cleaning of exam room while observing appropriate contact time as indicated for disinfecting solutions.   Reviewed form and advanced directives Reviewed other doctors No alcohol Still smokes---has cut back by using nicotine inhaler. Discussed Vision is okay Hearing still poor but doesn't want hearing aides Did trip on steps trying to avoid cat---fell onto lawn chair on ribs (pain but never got seen) No depression or anhedonia Independent with instrumental ADLs No problems with memory  Still uses the furosemide almost every day Still has some swelling but this helps it stay under control No chest pain or SOB  Back pain is intermittent Uses 1/2 tramadol prn  Still on calcium and vitamin D Tries to exercise---push ups and other light weights  Reviewed his history of emphysema Was diagnosed around 8-10 years ago in [place] Clear CXR findings (not sure if he had PFTs)  Current Outpatient Medications on File Prior to Visit  Medication Sig Dispense Refill  . calcium carbonate (TUMS - DOSED IN MG ELEMENTAL CALCIUM) 500 MG chewable tablet Chew 2-3 tablets by mouth daily.    . cholecalciferol (VITAMIN D) 1000 units tablet Take 1,000 Units by mouth daily.    . furosemide (LASIX) 20 MG tablet TAKE 1 TABLET (20 MG TOTAL) BY MOUTH DAILY AS NEEDED. FOR INCREASED MORNING LEG SWELLING 30 tablet 1  . nicotine (NICOTROL) 10 MG inhaler Inhale 1 continuous puffing into the lungs as needed for smoking cessation.    . traMADol (ULTRAM) 50 MG tablet TAKE 1 TABLET (50 MG TOTAL) BY MOUTH 3 (THREE) TIMES DAILY AS NEEDED. 30 tablet 0   No  current facility-administered medications on file prior to visit.    No Known Allergies  Past Medical History:  Diagnosis Date  . Compression fracture of fourth lumbar vertebra [facility])     Past Surgical History:  Procedure Laterality Date  . SKIN CANCER EXCISION     several    Family History  Problem Relation Age of Onset  . Cancer Mother   . Heart disease Neg Hx   . Diabetes Neg Hx     Social History   Socioeconomic History  . Marital status: Divorced    Spouse name: Not on file  . Number of children: 0  . Years of education: Not on file  . Highest education level: Not on file  Occupational History  . Occupation: Therapist, art    Comment: Retired  . Occupation: Chief Operating Officer    Comment: retired  Tobacco Use  . Smoking status: Light Tobacco Smoker  . Smokeless tobacco: Never Used  Substance and Sexual Activity  . Alcohol use: Not on file  . Drug use: Not on file  . Sexual activity: Not on file  Other Topics Concern  . Not on file  Social History Narrative   No living will      Would want neighbor Stephen Bennett to make decisions   He does have cousins in [place]   Would accept resuscitation   May accept tube feeds   Social Determinants of Health   Financial Resource Strain:   . Difficulty of Paying Living Expenses: Not on file  Food Insecurity:   . Worried About Charity fundraiser in the Last Year: Not on file  . Ran Out of Food in the Last Year: Not on file  Transportation Needs:   . Lack of Transportation (Medical): Not on file  . Lack of Transportation (Non-Medical): Not on file  Physical Activity:   . Days of Exercise per Week: Not on file  . Minutes of Exercise per Session: Not on file  Stress:   . Feeling of Stress : Not on file  Social Connections:   . Frequency of Communication with Friends and Family: Not on file  . Frequency of Social Gatherings with Friends and Family: Not on file  . Attends Religious Services: Not on file  .  Active Member of Clubs or Organizations: Not on file  . Attends Archivist Meetings: Not on file  . Marital Status: Not on file  Intimate Partner Violence:   . Fear of Current or Ex-Partner: Not on file  . Emotionally Abused: Not on file  . Physically Abused: Not on file  . Sexually Abused: Not on file    Review of Systems Appetite is good Weight is stable Sleeps okay Voids okay---nocturia x 1-2. Feels he empties adequately Bowels move well. No blood Full dentures---has soreness in mandible areas (has to use denture cream to secure) Wears seat belt No other significant joint pains No heartburn or swallowing problems Keeps up with dermatologist     Objective:   Physical Exam  Constitutional: He is oriented to person, place, and time. He appears well-developed. No distress.  HENT:  Mouth/Throat: Oropharynx is clear and moist. No oropharyngeal exudate.  No oral lesions with teeth out  Neck: No thyromegaly present.  Cardiovascular: Normal rate, regular rhythm, normal heart sounds and intact distal pulses. Exam reveals no gallop.  No murmur heard. Respiratory: Effort normal. No respiratory distress. He has no wheezes. He has no rales.  Decreased breath sounds but clear  GI: Soft. There is no abdominal tenderness.  Musculoskeletal:        General: No tenderness.     Comments: 1-2+ pitting edema in ankles only  Lymphadenopathy:    He has no cervical adenopathy.  Neurological: He is alert and oriented to person, place, and time.  President-- "Biden, Trump, Obama" [phone] D-l-r-o-w Recall 3/3  Skin: No rash noted. No erythema.  Psychiatric: He has a normal mood and affect. His behavior is normal.           Assessment & Plan:

## 2019-11-25 NOTE — Assessment & Plan Note (Signed)
Does okay with 1/2 tramadol most mornings

## 2019-11-25 NOTE — Assessment & Plan Note (Signed)
I have personally reviewed the Medicare Annual Wellness questionnaire and have noted 1. The patient's medical and social history 2. Their use of alcohol, tobacco or illicit drugs 3. Their current medications and supplements 4. The patient's functional ability including ADL's, fall risks, home safety risks and hearing or visual             impairment. 5. Diet and physical activities 6. Evidence for depression or mood disorders  The patients weight, height, BMI and visual acuity have been recorded in the chart I have made referrals, counseling and provided education to the patient based review of the above and I have provided the pt with a written personalized care plan for preventive services.  I have provided you with a copy of your personalized plan for preventive services. Please take the time to review along with your updated medication list.  He still opposes any immunizations (and won't take COVID) No PSA due to age Discussed exercise Colon okay in 2017--done

## 2019-11-25 NOTE — Assessment & Plan Note (Signed)
Diagnosed years ago Recalls a possible chlorine exposure also Does okay without meds Has cut back on smoking --mostly using inhaler (electronic) Will refer for lung cancer screening

## 2019-11-25 NOTE — Assessment & Plan Note (Signed)
Appears to have just chronic venous insufficiency On low dose furosemide Discussed using support socks

## 2019-11-26 ENCOUNTER — Telehealth: Payer: Self-pay | Admitting: *Deleted

## 2019-11-26 LAB — CBC
HCT: 28.9 % — ABNORMAL LOW (ref 39.0–52.0)
Hemoglobin: 9.7 g/dL — ABNORMAL LOW (ref 13.0–17.0)
MCHC: 33.7 g/dL (ref 30.0–36.0)
MCV: 100.8 fl — ABNORMAL HIGH (ref 78.0–100.0)
Platelets: 249 10*3/uL (ref 150.0–400.0)
RBC: 2.86 Mil/uL — ABNORMAL LOW (ref 4.22–5.81)
RDW: 27.6 % — ABNORMAL HIGH (ref 11.5–15.5)
WBC: 7.8 10*3/uL (ref 4.0–10.5)

## 2019-11-26 LAB — COMPREHENSIVE METABOLIC PANEL
ALT: 10 U/L (ref 0–53)
AST: 15 U/L (ref 0–37)
Albumin: 4.3 g/dL (ref 3.5–5.2)
Alkaline Phosphatase: 65 U/L (ref 39–117)
BUN: 11 mg/dL (ref 6–23)
CO2: 29 mEq/L (ref 19–32)
Calcium: 9 mg/dL (ref 8.4–10.5)
Chloride: 94 mEq/L — ABNORMAL LOW (ref 96–112)
Creatinine, Ser: 0.7 mg/dL (ref 0.40–1.50)
GFR: 108.72 mL/min (ref >60.00)
Glucose, Bld: 78 mg/dL (ref 70–99)
Potassium: 3.9 mEq/L (ref 3.5–5.1)
Sodium: 129 mEq/L — ABNORMAL LOW (ref 135–145)
Total Bilirubin: 1 mg/dL (ref 0.2–1.2)
Total Protein: 6.8 g/dL (ref 6.0–8.3)

## 2019-11-26 NOTE — Telephone Encounter (Signed)
Received referral for low dose lung cancer screening CT scan. Message left at phone number listed in EMR for patient to call me back to facilitate scheduling scan.  

## 2019-11-30 ENCOUNTER — Telehealth: Payer: Self-pay | Admitting: *Deleted

## 2019-11-30 DIAGNOSIS — Z87891 Personal history of nicotine dependence: Secondary | ICD-10-CM

## 2019-11-30 NOTE — Telephone Encounter (Signed)
Received referral for initial lung cancer screening scan. Contacted patient and obtained smoking history,(current, 63 pack year) as well as answering questions related to screening process. Patient denies signs of lung cancer such as weight loss or hemoptysis. Patient denies comorbidity that would prevent curative treatment if lung cancer were found. Patient is scheduled for shared decision making visit and CT scan on 12/07/19 at 2pm.

## 2019-12-07 ENCOUNTER — Inpatient Hospital Stay: Payer: Medicare Other | Attending: Nurse Practitioner | Admitting: Nurse Practitioner

## 2019-12-07 ENCOUNTER — Other Ambulatory Visit: Payer: Self-pay

## 2019-12-07 ENCOUNTER — Encounter: Payer: Self-pay | Admitting: Nurse Practitioner

## 2019-12-07 ENCOUNTER — Ambulatory Visit
Admission: RE | Admit: 2019-12-07 | Discharge: 2019-12-07 | Disposition: A | Discharge status: Home / Self Care | Payer: Medicare Other | Source: Ambulatory Visit | Attending: Nurse Practitioner | Admitting: Nurse Practitioner

## 2019-12-07 DIAGNOSIS — Z87891 Personal history of nicotine dependence: Secondary | ICD-10-CM | POA: Insufficient documentation

## 2019-12-07 DIAGNOSIS — F17210 Nicotine dependence, cigarettes, uncomplicated: Secondary | ICD-10-CM

## 2019-12-07 DIAGNOSIS — Z122 Encounter for screening for malignant neoplasm of respiratory organs: Secondary | ICD-10-CM

## 2019-12-07 NOTE — Progress Notes (Signed)
Virtual Visit via Video Enabled Telemedicine Note   I connected with Bransen Fassnacht on 12/07/19 at 1:45 PM EST by video enabled telemedicine visit and verified that I am speaking with the correct person using two identifiers.   I discussed the limitations, risks, security and privacy concerns of performing an evaluation and management service by telemedicine and the availability of in-person appointments. I also discussed with the patient that there may be a patient responsible charge related to this service. The patient expressed understanding and agreed to proceed.   Other persons participating in the visit and their role in the encounter: Burgess Estelle, RN- checking in patient & navigation  Patient's location: [place]  Provider's location: Clinic  Chief Complaint: Low Dose CT Screening  Patient agreed to evaluation by telemedicine to discuss shared decision making for consideration of low dose CT lung cancer screening.    In accordance with CMS guidelines, patient has met eligibility criteria including age, absence of signs or symptoms of lung cancer.  Social History   Tobacco Use  . Smoking status: Current Every Day Smoker    Packs/day: 1.00    Years: 63.00    Pack years: 63.00    Types: Cigarettes  . Smokeless tobacco: Never Used  Substance Use Topics  . Alcohol use: Not on file     A shared decision-making session was conducted prior to the performance of CT scan. This includes one or more decision aids, includes benefits and harms of screening, follow-up diagnostic testing, over-diagnosis, false positive rate, and total radiation exposure.   Counseling on the importance of adherence to annual lung cancer LDCT screening, impact of co-morbidities, and ability or willingness to undergo diagnosis and treatment is imperative for compliance of the program.   Counseling on the importance of continued smoking cessation for former smokers; the importance of smoking cessation  for current smokers, and information about tobacco cessation interventions have been given to patient including [facility] and [employer] Quit [place] programs.   Written order for lung cancer screening with LDCT has been given to the patient and any and all questions have been answered to the best of my abilities.    Yearly follow up will be coordinated by Burgess Estelle, Thoracic Navigator.  I discussed the assessment and treatment plan with the patient. The patient was provided an opportunity to ask questions and all were answered. The patient agreed with the plan and demonstrated an understanding of the instructions.   The patient was advised to call back or seek an in-person evaluation if the symptoms worsen or if the condition fails to improve as anticipated.   I provided 15 minutes of face-to-face video visit time during this encounter, and > 50% was spent counseling as documented under my assessment & plan.   Beckey Rutter, DNP, AGNP-C [place] at [facility] [phone] (clinic)

## 2019-12-09 ENCOUNTER — Encounter: Payer: Self-pay | Admitting: *Deleted

## 2019-12-17 ENCOUNTER — Other Ambulatory Visit: Payer: Self-pay | Admitting: Internal Medicine

## 2019-12-21 ENCOUNTER — Telehealth: Payer: Self-pay

## 2019-12-21 DIAGNOSIS — D649 Anemia, unspecified: Secondary | ICD-10-CM

## 2019-12-21 NOTE — Telephone Encounter (Signed)
Please call after 4

## 2019-12-21 NOTE — Telephone Encounter (Signed)
Pt aware of shannon's comments he has questions about what he needs to do for his sodium  Please call pt

## 2019-12-21 NOTE — Telephone Encounter (Signed)
Pt mailed a letter after recent OV. Asked if he should add salt to his diet since his sodium was low. Left message on VM per DPR advising him not to add salt to his diet. Per Dr Silvio Pate, that is not the reason his sodium is low.

## 2019-12-22 NOTE — Telephone Encounter (Signed)
He does not need to add salt---but he should not restrict it either. If he wants, we can recheck his sodium in a month or so (let me know if he does and I will add the order)

## 2019-12-23 NOTE — Telephone Encounter (Addendum)
Spoke to pt. He agreed to go to hematology. I created the referral with notes of [facility] on Tuesday or Thursday afternoons. I also advised him to not be alarmed when he hears [facility] because that is where hematologists are located. He is not too worried about rechecking the sodium level right now.

## 2019-12-23 NOTE — Addendum Note (Signed)
Addended by: Pilar Grammes on: 12/23/2019 12:01 PM   Modules accepted: Orders

## 2019-12-24 ENCOUNTER — Other Ambulatory Visit: Payer: Self-pay

## 2019-12-24 NOTE — Telephone Encounter (Signed)
Last filled 11-30-19 #30 Last OV 11-25-19 [facility]

## 2019-12-25 MED ORDER — TRAMADOL HCL 50 MG PO TABS: 50.0000 mg | ORAL_TABLET | Freq: Three times a day (TID) | ORAL | 0 refills | Status: DC | PRN

## 2019-12-29 ENCOUNTER — Encounter: Payer: Self-pay | Admitting: Oncology

## 2019-12-30 ENCOUNTER — Inpatient Hospital Stay: Payer: Medicare Other | Attending: Oncology | Admitting: Oncology

## 2019-12-30 ENCOUNTER — Other Ambulatory Visit: Payer: Self-pay

## 2019-12-30 ENCOUNTER — Encounter (INDEPENDENT_AMBULATORY_CARE_PROVIDER_SITE_OTHER): Payer: Self-pay

## 2019-12-30 ENCOUNTER — Inpatient Hospital Stay: Payer: Medicare Other

## 2019-12-30 VITALS — BP 128/63 | HR 68 | Temp 95.5°F | Wt 141.0 lb

## 2019-12-30 DIAGNOSIS — Z85828 Personal history of other malignant neoplasm of skin: Secondary | ICD-10-CM | POA: Diagnosis not present

## 2019-12-30 DIAGNOSIS — R7989 Other specified abnormal findings of blood chemistry: Secondary | ICD-10-CM | POA: Diagnosis not present

## 2019-12-30 DIAGNOSIS — Z833 Family history of diabetes mellitus: Secondary | ICD-10-CM | POA: Diagnosis not present

## 2019-12-30 DIAGNOSIS — Z8249 Family history of ischemic heart disease and other diseases of the circulatory system: Secondary | ICD-10-CM | POA: Diagnosis not present

## 2019-12-30 DIAGNOSIS — J439 Emphysema, unspecified: Secondary | ICD-10-CM | POA: Diagnosis not present

## 2019-12-30 DIAGNOSIS — D649 Anemia, unspecified: Secondary | ICD-10-CM | POA: Insufficient documentation

## 2019-12-30 DIAGNOSIS — F17210 Nicotine dependence, cigarettes, uncomplicated: Secondary | ICD-10-CM | POA: Insufficient documentation

## 2019-12-30 DIAGNOSIS — Z79899 Other long term (current) drug therapy: Secondary | ICD-10-CM | POA: Diagnosis not present

## 2019-12-30 LAB — FERRITIN: Ferritin: 608 ng/mL — ABNORMAL HIGH (ref 24–336)

## 2019-12-30 LAB — RETICULOCYTES
Immature Retic Fract: 19.9 % — ABNORMAL HIGH (ref 2.3–15.9)
RBC.: 2.68 MIL/uL — ABNORMAL LOW (ref 4.22–5.81)
Retic Count, Absolute: 38.9 10*3/uL (ref 19.0–186.0)
Retic Ct Pct: 1.5 % (ref 0.4–3.1)

## 2019-12-30 LAB — IRON AND TIBC
Iron: 85 ug/dL (ref 45–182)
Saturation Ratios: 41 % — ABNORMAL HIGH (ref 17.9–39.5)
TIBC: 210 ug/dL — ABNORMAL LOW (ref 250–450)
UIBC: 125 ug/dL

## 2019-12-30 LAB — CBC
HCT: 28 % — ABNORMAL LOW (ref 39.0–52.0)
Hemoglobin: 8.8 g/dL — ABNORMAL LOW (ref 13.0–17.0)
MCH: 32.8 pg (ref 26.0–34.0)
MCHC: 31.4 g/dL (ref 30.0–36.0)
MCV: 104.5 fL — ABNORMAL HIGH (ref 80.0–100.0)
Platelets: 240 10*3/uL (ref 150–400)
RBC: 2.68 MIL/uL — ABNORMAL LOW (ref 4.22–5.81)
RDW: 25.1 % — ABNORMAL HIGH (ref 11.5–15.5)
WBC: 6.1 10*3/uL (ref 4.0–10.5)
nRBC: 0.3 % — ABNORMAL HIGH (ref 0.0–0.2)

## 2019-12-30 LAB — VITAMIN B12: Vitamin B-12: 345 pg/mL (ref 180–914)

## 2019-12-30 LAB — LACTATE DEHYDROGENASE: LDH: 120 U/L (ref 98–192)

## 2019-12-30 LAB — DAT, POLYSPECIFIC AHG (ARMC ONLY): Polyspecific AHG test: NEGATIVE

## 2019-12-30 LAB — FOLATE: Folate: 10 ng/mL (ref >5.9)

## 2019-12-31 LAB — ERYTHROPOIETIN: Erythropoietin: 38.2 m[IU]/mL — ABNORMAL HIGH (ref 2.6–18.5)

## 2019-12-31 LAB — HAPTOGLOBIN: Haptoglobin: 97 mg/dL (ref 34–355)

## 2019-12-31 NOTE — Progress Notes (Signed)
[place]  [phone]) [phone] [phone]  ID: Stephen Bennett OB: Apr 24, 1941  MR#: [id number]  [facility]#:[id number]  Patient Care Team: Venia Carbon, MD as PCP - General (Internal Medicine)  CHIEF COMPLAINT: Anemia, unspecified.  INTERVAL HISTORY: Patient is a 79 year old male who was noted to have a declining hemoglobin on routine blood work.  He currently feels well and is asymptomatic.  He does not complain of any weakness or fatigue.  He has no neurologic complaints.  He denies any recent fevers or illnesses.  He has a good appetite and denies weight loss.  He has no chest pain, shortness of breath, cough, or hemoptysis.  He denies any nausea, vomiting, constipation, or diarrhea.  He has no melena or hematochezia.  He has no urinary complaints.  Patient feels at his baseline and offers no specific complaints today.  REVIEW OF SYSTEMS:   Review of Systems  Constitutional: Negative.  Negative for fever, malaise/fatigue and weight loss.  Respiratory: Negative.  Negative for cough and shortness of breath.   Cardiovascular: Negative.  Negative for chest pain and leg swelling.  Gastrointestinal: Negative.  Negative for abdominal pain, blood in stool and melena.  Genitourinary: Negative.  Negative for hematuria.  Musculoskeletal: Negative.  Negative for back pain.  Skin: Negative.  Negative for rash.  Neurological: Negative.  Negative for dizziness, focal weakness, weakness and headaches.  Psychiatric/Behavioral: Negative.  The patient is not nervous/anxious.     As per HPI. Otherwise, a complete review of systems is negative.  PAST MEDICAL HISTORY: Past Medical History:  Diagnosis Date  . Compression fracture of fourth lumbar vertebra (HCC)     PAST SURGICAL HISTORY: Past Surgical History:  Procedure Laterality Date  . SKIN CANCER EXCISION     several    FAMILY HISTORY: Family History  Problem Relation Age of Onset  . Cancer Mother   . Heart  disease Neg Hx   . Diabetes Neg Hx     ADVANCED DIRECTIVES (Y/N):  N  HEALTH MAINTENANCE: Social History   Tobacco Use  . Smoking status: Current Every Day Smoker    Packs/day: 1.00    Years: 63.00    Pack years: 63.00    Types: Cigarettes  . Smokeless tobacco: Never Used  Substance Use Topics  . Alcohol use: Not on file  . Drug use: Not on file     Colonoscopy:  PAP:  Bone density:  Lipid panel:  No Known Allergies  Current Outpatient Medications  Medication Sig Dispense Refill  . calcium carbonate (TUMS - DOSED IN MG ELEMENTAL CALCIUM) 500 MG chewable tablet Chew 2-3 tablets by mouth daily.    . cholecalciferol (VITAMIN D) 1000 units tablet Take 1,000 Units by mouth in the morning and at bedtime.     . furosemide (LASIX) 20 MG tablet Take 1 tablet (20 mg total) by mouth daily as needed. For increased morning leg swelling 90 tablet 3  . nicotine (NICOTROL) 10 MG inhaler Inhale 1 continuous puffing into the lungs as needed for smoking cessation.    . traMADol (ULTRAM) 50 MG tablet Take 1 tablet (50 mg total) by mouth 3 (three) times daily as needed. 30 tablet 0   No current facility-administered medications for this visit.    OBJECTIVE: Vitals:   12/30/19 1351  BP: 128/63  Pulse: 68  Temp: (!) 95.5 F (35.3 C)  SpO2: 100%     Body mass index is 22.76 kg/m.    ECOG FS:0 - Asymptomatic  General: Well-developed, well-nourished, no acute distress. Eyes: Pink conjunctiva, anicteric sclera. HEENT: Normocephalic, moist mucous membranes. Lungs: No audible wheezing or coughing. Heart: Regular rate and rhythm. Abdomen: Soft, nontender, no obvious distention. Musculoskeletal: No edema, cyanosis, or clubbing. Neuro: Alert, answering all questions appropriately. Cranial nerves grossly intact. Skin: No rashes or petechiae noted. Psych: Normal affect. Lymphatics: No cervical, calvicular, axillary or inguinal LAD.   LAB RESULTS:  Lab Results  Component Value Date    NA 129 (L) 11/25/2019   K 3.9 11/25/2019   CL 94 (L) 11/25/2019   CO2 29 11/25/2019   GLUCOSE 78 11/25/2019   BUN 11 11/25/2019   CREATININE 0.70 11/25/2019   CALCIUM 9.0 11/25/2019   PROT 6.8 11/25/2019   ALBUMIN 4.3 11/25/2019   AST 15 11/25/2019   ALT 10 11/25/2019   ALKPHOS 65 11/25/2019   BILITOT 1.0 11/25/2019    Lab Results  Component Value Date   WBC 6.1 12/30/2019   NEUTROABS 4.3 05/07/2018   HGB 8.8 (L) 12/30/2019   HCT 28.0 (L) 12/30/2019   MCV 104.5 (H) 12/30/2019   PLT 240 12/30/2019   Lab Results  Component Value Date   IRON 85 12/30/2019   TIBC 210 (L) 12/30/2019   IRONPCTSAT 41 (H) 12/30/2019   Lab Results  Component Value Date   FERRITIN 608 (H) 12/30/2019     STUDIES: CT CHEST LUNG CANCER SCREENING LOW DOSE WO CONTRAST  Result Date: 12/07/2019 CLINICAL DATA:  79 year old male current smoker, with 63 pack-year history of smoking, for initial lung cancer screening EXAM: CT CHEST WITHOUT CONTRAST LOW-DOSE FOR LUNG CANCER SCREENING TECHNIQUE: Multidetector CT imaging of the chest was performed following the standard protocol without IV contrast. COMPARISON:  None. FINDINGS: Cardiovascular: The heart is normal in size. No pericardial effusion. No evidence of thoracic aortic aneurysm. Atherosclerotic calcifications of the aortic arch. Hypodense blood pool relative to myocardium, suggesting anemia. Mediastinum/Nodes: No suspicious mediastinal lymphadenopathy. Large left thyroid nodules measuring up to 4.1 cm (series 2/image 14) with associated substernal goiter (series 2/image 17). Lungs/Pleura: Mild biapical pleural-parenchymal scarring. Mild centrilobular and paraseptal emphysematous changes. No focal consolidation. Scattered calcified granulomata, benign. 5.4 mm noncalcified nodule in the medial left upper lobe. No pleural effusion or pneumothorax. Upper Abdomen: Visualized upper abdomen is notable for vascular calcifications and small hepatic cysts measuring up  to 1.6 cm in segment 2. Musculoskeletal: Moderate degenerative changes of the visualized thoracolumbar spine with midthoracic dextroscoliosis. IMPRESSION: Lung-RADS 2, benign appearance or behavior. Continue annual screening with low-dose chest CT without contrast in 12 months. Aortic Atherosclerosis (ICD10-I70.0) and Emphysema (ICD10-J43.9). Electronically Signed   By: Julian Hy M.D.   On: 12/07/2019 16:10    ASSESSMENT: Anemia, unspecified.  PLAN:    1.  Anemia unspecified: Patient's hemoglobin continues to trend down and is 8.8 today.  He has a essentially normal iron panel with an elevated ferritin.  B12 and folate are within normal limits.  He has no obvious evidence of hemolysis.  SPEP is pending at time of dictation.  Given his macrocytosis and elevated ferritin, suspect patient has underlying MDS.  This would take a bone marrow biopsy to diagnose which is not necessary at this point.  Will order IntelliGen myeloid panel with next lab draw.  No intervention is needed at this time, but can consider Retacrit in the future if needed.  Patient will have video assisted telemedicine visit in approximately 2 weeks to discuss his results.  I spent a total of 45 minutes reviewing  chart data, face-to-face evaluation with the patient, counseling and coordination of care as detailed above.   Patient expressed understanding and was in agreement with this plan. He also understands that He can call clinic at any time with any questions, concerns, or complaints.     Lloyd Huger, MD   12/31/2019 9:05 AM

## 2020-01-03 LAB — PROTEIN ELECTROPHORESIS, SERUM
A/G Ratio: 2 — ABNORMAL HIGH (ref 0.7–1.7)
Albumin ELP: 4.1 g/dL (ref 2.9–4.4)
Alpha-1-Globulin: 0.2 g/dL (ref 0.0–0.4)
Alpha-2-Globulin: 0.4 g/dL (ref 0.4–1.0)
Beta Globulin: 0.7 g/dL (ref 0.7–1.3)
Gamma Globulin: 0.8 g/dL (ref 0.4–1.8)
Globulin, Total: 2.1 g/dL — ABNORMAL LOW (ref 2.2–3.9)
Total Protein ELP: 6.2 g/dL (ref 6.0–8.5)

## 2020-01-06 NOTE — Progress Notes (Signed)
[place]  Telephone:(336) [phone] Fax:(336) [phone]  ID: Stephen Bennett OB: [birth year]  MR#: [id number]  YJE#:[id number]  Patient Care Team: Venia Carbon, MD as PCP - General (Internal Medicine)  I connected with Stephen Bennett on 01/11/20 at  1:45 PM EDT by video enabled telemedicine visit and verified that I am speaking with the correct person using two identifiers.   I discussed the limitations, risks, security and privacy concerns of performing an evaluation and management service by telemedicine and the availability of in-person appointments. I also discussed with the patient that there may be a patient responsible charge related to this service. The patient expressed understanding and agreed to proceed.   Other persons participating in the visit and their role in the encounter: Patient, MD.  Patient's location: Home. Provider's location: Clinic.  CHIEF COMPLAINT: Anemia, unspecified.  INTERVAL HISTORY: Patient agreed to video enabled telemedicine visit for routine follow-up and discussion of his laboratory work.  Secondary to technical difficulties, visit was completed as telephone only.  He continues to feel well and remains asymptomatic.  He does not complain of any weakness or fatigue.  He has no neurologic complaints.  He denies any recent fevers or illnesses.  He has a good appetite and denies weight loss.  He has no chest pain, shortness of breath, cough, or hemoptysis.  He denies any nausea, vomiting, constipation, or diarrhea.  He has no melena or hematochezia.  He has no urinary complaints.  Patient feels at his baseline offers no specific complaints today.  REVIEW OF SYSTEMS:   Review of Systems  Constitutional: Negative.  Negative for fever, malaise/fatigue and weight loss.  Respiratory: Negative.  Negative for cough and shortness of breath.   Cardiovascular: Negative.  Negative for chest pain and leg swelling.  Gastrointestinal: Negative.   Negative for abdominal pain, blood in stool and melena.  Genitourinary: Negative.  Negative for hematuria.  Musculoskeletal: Negative.  Negative for back pain.  Skin: Negative.  Negative for rash.  Neurological: Negative.  Negative for dizziness, focal weakness, weakness and headaches.  Psychiatric/Behavioral: Negative.  The patient is not nervous/anxious.     As per HPI. Otherwise, a complete review of systems is negative.  PAST MEDICAL HISTORY: Past Medical History:  Diagnosis Date  . Compression fracture of fourth lumbar vertebra (HCC)     PAST SURGICAL HISTORY: Past Surgical History:  Procedure Laterality Date  . SKIN CANCER EXCISION     several    FAMILY HISTORY: Family History  Problem Relation Age of Onset  . Cancer Mother   . Heart disease Neg Hx   . Diabetes Neg Hx     ADVANCED DIRECTIVES (Y/N):  N  HEALTH MAINTENANCE: Social History   Tobacco Use  . Smoking status: Current Every Day Smoker    Packs/day: 0.50    Years: 63.00    Pack years: 31.50    Types: Cigarettes  . Smokeless tobacco: Never Used  Substance Use Topics  . Alcohol use: Not Currently  . Drug use: Not on file     Colonoscopy:  PAP:  Bone density:  Lipid panel:  No Known Allergies  Current Outpatient Medications  Medication Sig Dispense Refill  . calcium carbonate (TUMS - DOSED IN MG ELEMENTAL CALCIUM) 500 MG chewable tablet Chew 2-3 tablets by mouth daily.    . cholecalciferol (VITAMIN D) 1000 units tablet Take 1,000 Units by mouth in the morning and at bedtime.     . furosemide (LASIX) 20 MG tablet Take  1 tablet (20 mg total) by mouth daily as needed. For increased morning leg swelling 90 tablet 3  . nicotine (NICOTROL) 10 MG inhaler Inhale 1 continuous puffing into the lungs as needed for smoking cessation.    . traMADol (ULTRAM) 50 MG tablet Take 1 tablet (50 mg total) by mouth 3 (three) times daily as needed. 30 tablet 0   No current facility-administered medications for this  visit.    OBJECTIVE: There were no vitals filed for this visit.   There is no height or weight on file to calculate BMI.    ECOG FS:0 - Asymptomatic   LAB RESULTS:  Lab Results  Component Value Date   NA 129 (L) 11/25/2019   K 3.9 11/25/2019   CL 94 (L) 11/25/2019   CO2 29 11/25/2019   GLUCOSE 78 11/25/2019   BUN 11 11/25/2019   CREATININE 0.70 11/25/2019   CALCIUM 9.0 11/25/2019   PROT 6.8 11/25/2019   ALBUMIN 4.3 11/25/2019   AST 15 11/25/2019   ALT 10 11/25/2019   ALKPHOS 65 11/25/2019   BILITOT 1.0 11/25/2019    Lab Results  Component Value Date   WBC 6.1 12/30/2019   NEUTROABS 4.3 05/07/2018   HGB 8.8 (L) 12/30/2019   HCT 28.0 (L) 12/30/2019   MCV 104.5 (H) 12/30/2019   PLT 240 12/30/2019   Lab Results  Component Value Date   IRON 85 12/30/2019   TIBC 210 (L) 12/30/2019   IRONPCTSAT 41 (H) 12/30/2019   Lab Results  Component Value Date   FERRITIN 608 (H) 12/30/2019     STUDIES: No results found.  ASSESSMENT: Anemia, unspecified.  PLAN:    1.  Anemia unspecified: Patient's most recent hemoglobin on December 30, 2019 was reported 8.8.  He has a essentially normal iron panel with an elevated ferritin.  B12 and folate are within normal limits.  He has no obvious evidence of hemolysis.  SPEP is negative.  Given his macrocytosis and elevated ferritin, suspect patient has underlying MDS.  This would take a bone marrow biopsy to diagnose which is not necessary at this point.  Will order [employer] myeloid panel with next lab draw.  No intervention is needed at this time.  Patient agreed that if his hemoglobin continues to decline or he became symptomatic, would pursue biopsy at that point.  Return to clinic in 3 months with repeat laboratory work and further evaluation.  I provided 20 minutes of face-to-face video visit time during this encounter which included chart review, counseling, and coordination of care as documented above.   Patient expressed  understanding and was in agreement with this plan. He also understands that He can call clinic at any time with any questions, concerns, or complaints.     Lloyd Huger, MD   01/11/2020 3:40 PM

## 2020-01-11 ENCOUNTER — Encounter: Payer: Self-pay | Admitting: Oncology

## 2020-01-11 ENCOUNTER — Inpatient Hospital Stay (HOSPITAL_BASED_OUTPATIENT_CLINIC_OR_DEPARTMENT_OTHER): Payer: Medicare Other | Admitting: Oncology

## 2020-01-11 DIAGNOSIS — D649 Anemia, unspecified: Secondary | ICD-10-CM

## 2020-02-15 ENCOUNTER — Other Ambulatory Visit: Payer: Self-pay | Admitting: Internal Medicine

## 2020-02-15 NOTE — Telephone Encounter (Signed)
Patient is requesting a refill on Tramadol.  Patient has a few left.  Patient uses [facility].

## 2020-02-16 MED ORDER — TRAMADOL HCL 50 MG PO TABS: 50.0000 mg | ORAL_TABLET | Freq: Three times a day (TID) | ORAL | 0 refills | Status: DC | PRN

## 2020-02-16 NOTE — Telephone Encounter (Signed)
Last filled 12-25-19 #30 Last OV 11-25-19 [facility]

## 2020-04-12 ENCOUNTER — Telehealth: Payer: Self-pay | Admitting: *Deleted

## 2020-04-12 NOTE — Telephone Encounter (Signed)
Patient called and states that he wants to cancel his appointment until the mask mandate is over. He states he has COPD and cannot breathe with a mask on and that the last time he was in the office, he hasd a very difficult time with his breathing with the mask on. He states he thinks his blood is alright right now. He asks we call him back once the mandate to wear a mask is off

## 2020-04-17 ENCOUNTER — Inpatient Hospital Stay: Payer: Medicare Other

## 2020-04-17 ENCOUNTER — Inpatient Hospital Stay: Payer: Medicare Other | Admitting: Oncology

## 2020-06-22 ENCOUNTER — Other Ambulatory Visit: Payer: Self-pay | Admitting: Internal Medicine

## 2020-06-22 NOTE — Telephone Encounter (Signed)
Last office visit 11/25/2019 for CPE.  Last refilled 02/16/2020 for #30 with no refills.  CPE scheduled for 11/30/2020.

## 2020-07-19 IMAGING — CT CT CHEST LUNG CANCER SCREENING LOW DOSE W/O CM
2 of 5 series · 15 of 40 positions shown, 18 images · non-contrast
Comparison: None.

CLINICAL DATA: 78-year-old male current smoker, with 63 pack-year
history of smoking, for initial lung cancer screening

EXAM:
CT CHEST WITHOUT CONTRAST LOW-DOSE FOR LUNG CANCER SCREENING
TECHNIQUE: Multidetector CT imaging of the chest was performed following the
standard protocol without IV contrast.

[Series 3: lung 1.00 · axial · 0.69mm/px · z∈[-1357,-1011]mm · 12 of 382 slices shown, 15 images]
[im 18/382  mediastinal]
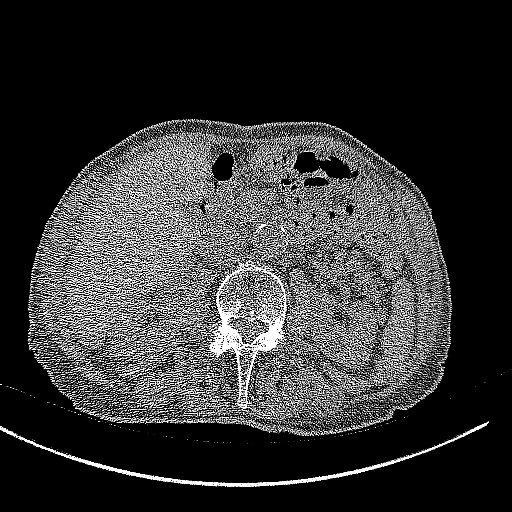
[im 18/382  lung]
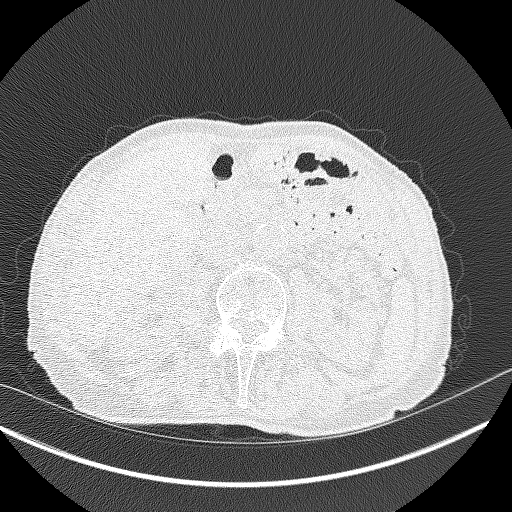
[im 52/382  lung]
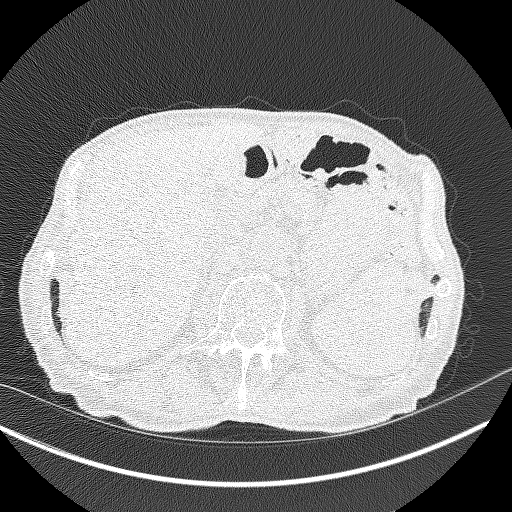
[im 87/382  lung]
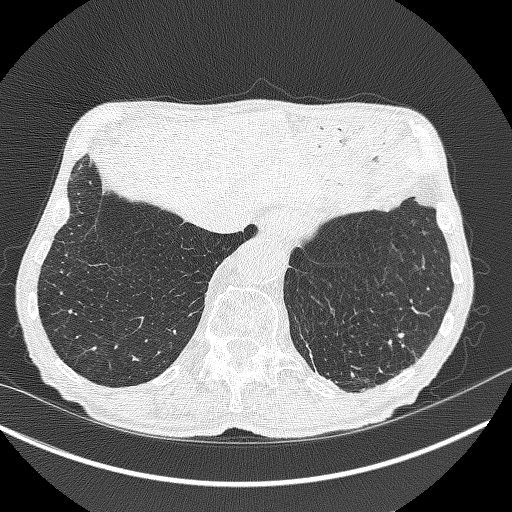
[im 122/382  lung]
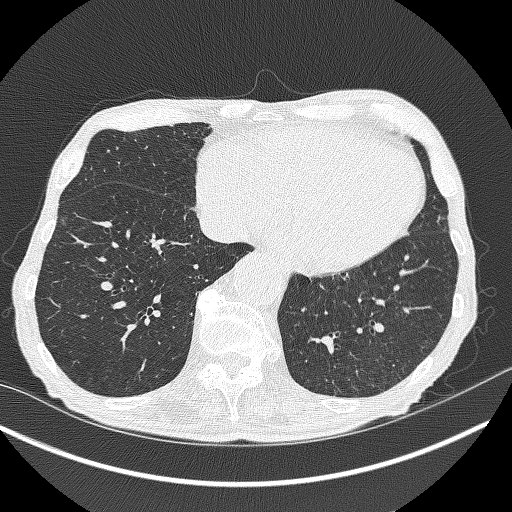
[im 139/382  mediastinal]
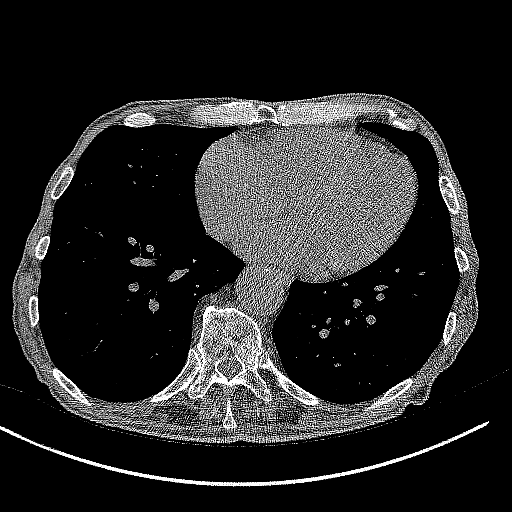
[im 139/382  lung]
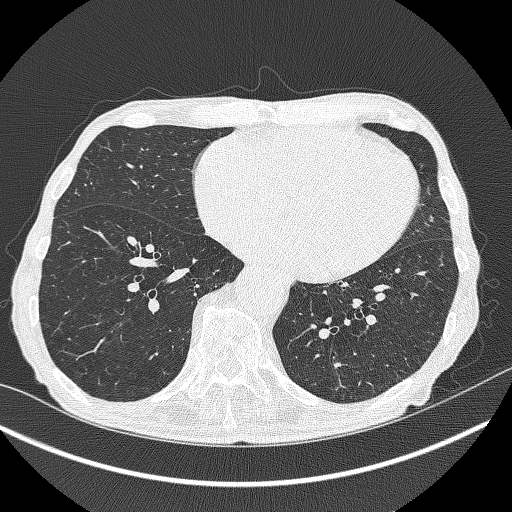
[im 174/382  lung]
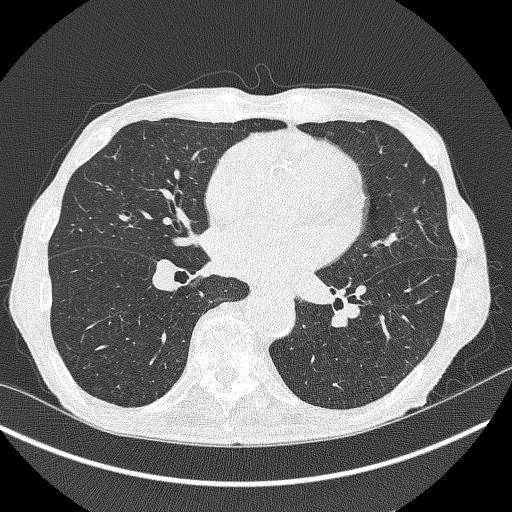
[im 208/382  lung]
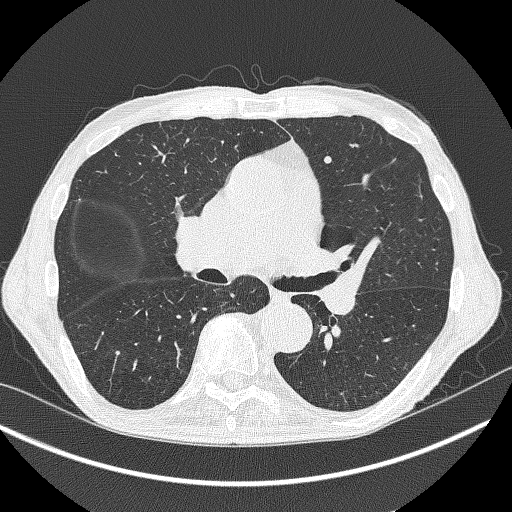
[im 243/382  lung]
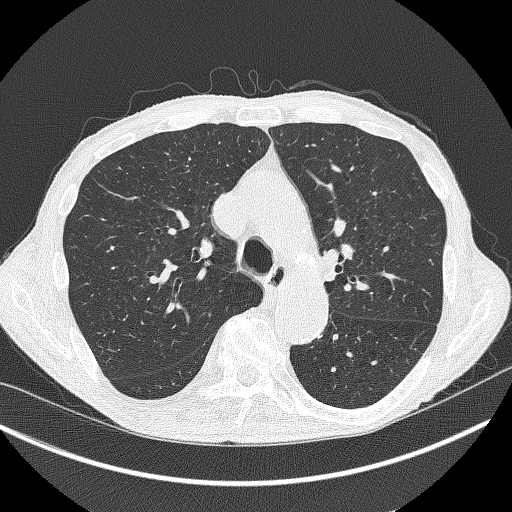
[im 260/382  mediastinal]
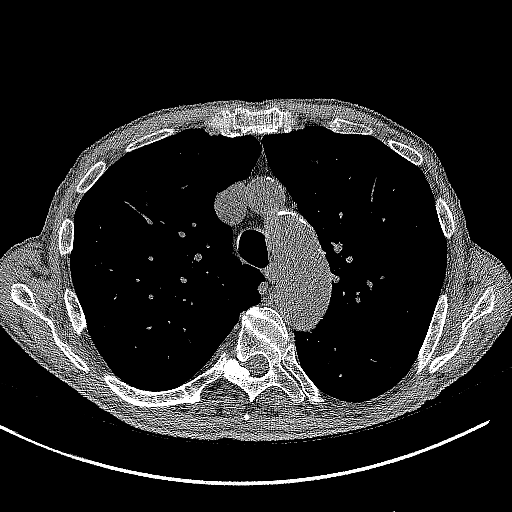
[im 260/382  lung]
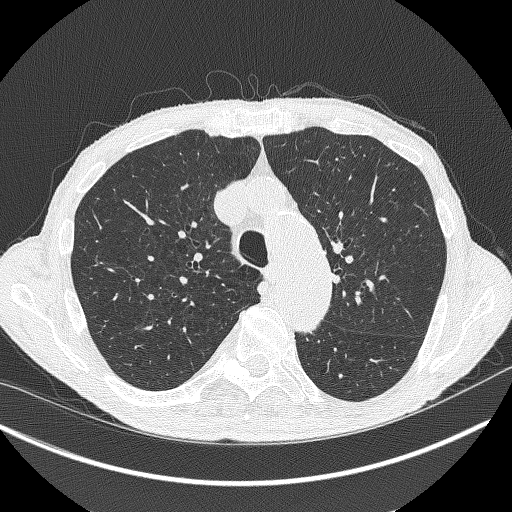
[im 295/382  lung]
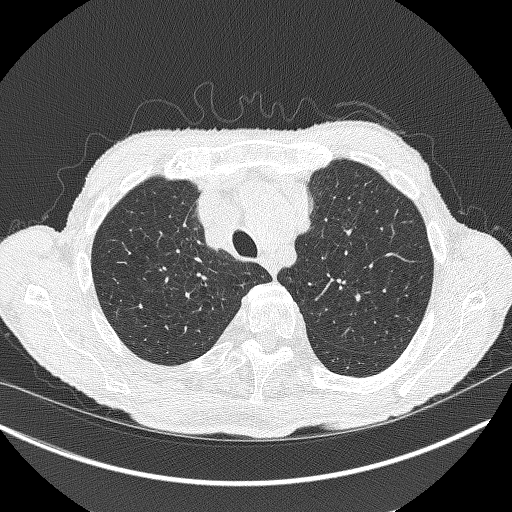
[im 330/382  lung]
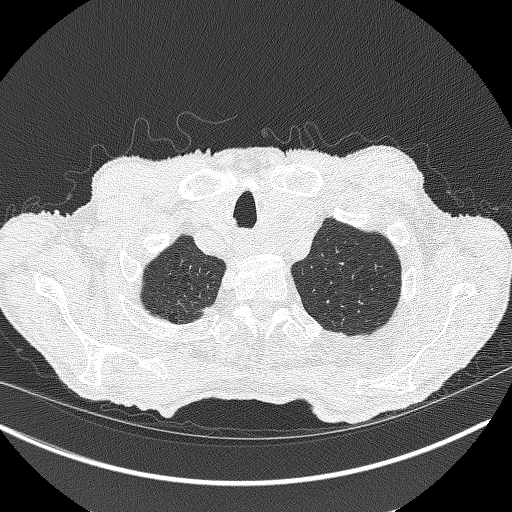
[im 364/382  lung]
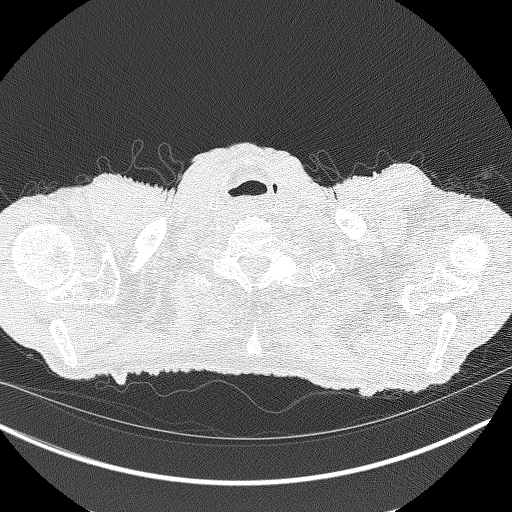

[Series 4: coronals lung 1.00 cor · coronal · 0.69mm/px · 3 of 310 slices shown]
[im 62/310  lung]
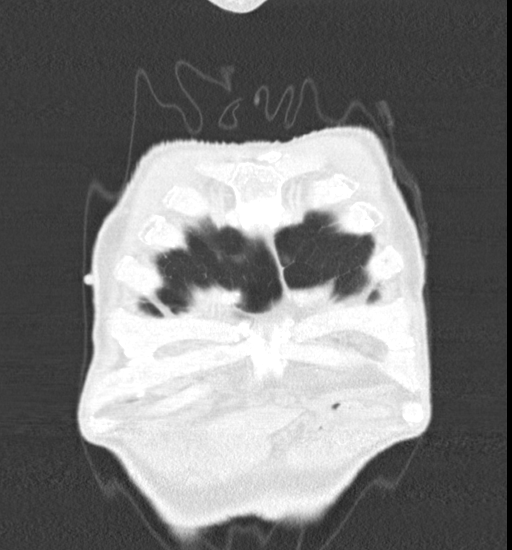
[im 124/310  lung]
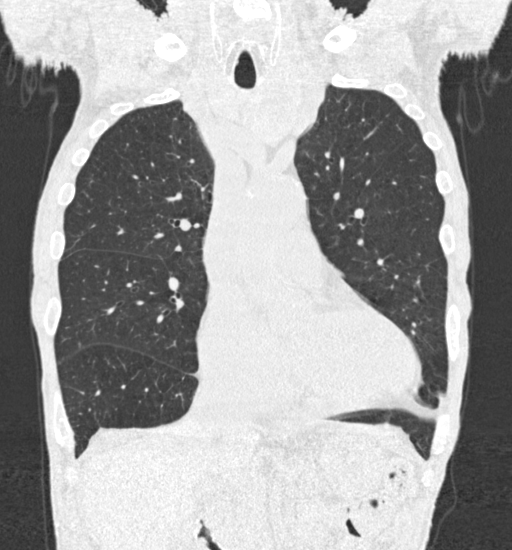
[im 186/310  lung]
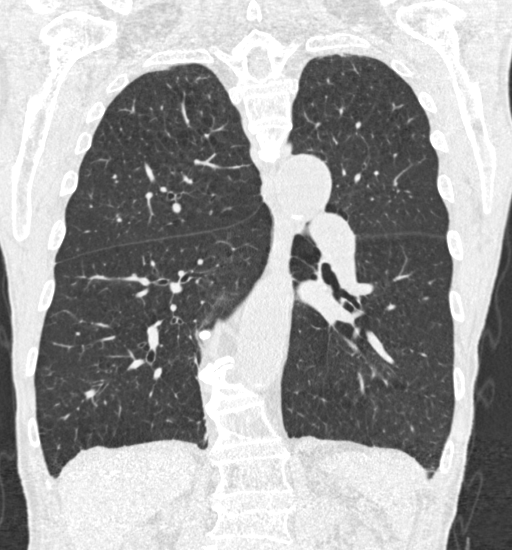

[15 of 40 positions shown; findings below may reference images not displayed]

FINDINGS: Cardiovascular: The heart is normal in size. No pericardial
effusion.

No evidence of thoracic aortic aneurysm. Atherosclerotic
calcifications of the aortic arch.

Hypodense blood pool relative to myocardium, suggesting anemia.

Mediastinum/Nodes: No suspicious mediastinal lymphadenopathy.

Large left thyroid nodules measuring up to 4.1 cm (series 2/image
14) with associated substernal goiter (series 2/image 17).

Lungs/Pleura: Mild biapical pleural-parenchymal scarring.

Mild centrilobular and paraseptal emphysematous changes.

No focal consolidation.

Scattered calcified granulomata, benign.

5.4 mm noncalcified nodule in the medial left upper lobe.

No pleural effusion or pneumothorax.

Upper Abdomen: Visualized upper abdomen is notable for vascular
calcifications and small hepatic cysts measuring up to 1.6 cm in
segment 2.

Musculoskeletal: Moderate degenerative changes of the visualized
thoracolumbar spine with midthoracic dextroscoliosis.
IMPRESSION: Lung-RADS 2, benign appearance or behavior. Continue annual
screening with low-dose chest CT without contrast in 12 months.

Aortic Atherosclerosis (UF1HR-27L.L) and Emphysema (UF1HR-MU0.X).

## 2020-10-04 ENCOUNTER — Other Ambulatory Visit: Payer: Self-pay | Admitting: Internal Medicine

## 2020-10-04 NOTE — Telephone Encounter (Signed)
Last filled 06-22-20 #30 Last OV 11-25-19 Next OV 11-30-20 [facility] [address]

## 2020-11-30 ENCOUNTER — Encounter: Payer: Self-pay | Admitting: Internal Medicine

## 2020-11-30 ENCOUNTER — Ambulatory Visit (INDEPENDENT_AMBULATORY_CARE_PROVIDER_SITE_OTHER): Payer: Medicare Other | Admitting: Internal Medicine

## 2020-11-30 ENCOUNTER — Other Ambulatory Visit: Payer: Self-pay

## 2020-11-30 VITALS — BP 112/70 | HR 68 | Temp 98.0°F | Ht 65.75 in | Wt 132.0 lb

## 2020-11-30 DIAGNOSIS — Z0000 Encounter for general adult medical examination without abnormal findings: Secondary | ICD-10-CM | POA: Diagnosis not present

## 2020-11-30 DIAGNOSIS — R609 Edema, unspecified: Secondary | ICD-10-CM | POA: Diagnosis not present

## 2020-11-30 DIAGNOSIS — M545 Low back pain, unspecified: Secondary | ICD-10-CM | POA: Diagnosis not present

## 2020-11-30 DIAGNOSIS — J439 Emphysema, unspecified: Secondary | ICD-10-CM | POA: Diagnosis not present

## 2020-11-30 DIAGNOSIS — G8929 Other chronic pain: Secondary | ICD-10-CM

## 2020-11-30 DIAGNOSIS — Z7189 Other specified counseling: Secondary | ICD-10-CM

## 2020-11-30 DIAGNOSIS — E441 Mild protein-calorie malnutrition: Secondary | ICD-10-CM | POA: Diagnosis not present

## 2020-11-30 NOTE — Assessment & Plan Note (Signed)
Asked him to step up with eating--consider supplements (Boost or Ensure instead of skipping meal)

## 2020-11-30 NOTE — Assessment & Plan Note (Signed)
Forms given Really needs to do formal records since wants neighbor to be decision maker

## 2020-11-30 NOTE — Assessment & Plan Note (Signed)
Uses the tramadol in very low dose

## 2020-11-30 NOTE — Assessment & Plan Note (Signed)
No major symptoms but very limited activity No Rx Urged him to stop smoking again

## 2020-11-30 NOTE — Progress Notes (Signed)
Subjective:    Patient ID: Stephen Bennett, male    DOB: 1941/07/28, 80 y.o.   MRN: [id number]  HPI Here for Medicare wellness visit and follow up of chronic health conditions This visit occurred during the SARS-CoV-2 public health emergency.  Safety protocols were in place, including screening questions prior to the visit, additional usage of staff PPE, and extensive cleaning of exam room while observing appropriate contact time as indicated for disinfecting solutions.   Reviewed advanced directives Reviewed other doctors----hasn't had eye exam, Stephen Bennett No hospitalizations or surgery this year Still smokes No alcohol Vision is okay Poor hearing No falls Having some aggravations--like with furnace company (went to [employer] court and won--but didn't get full recovery). No sig depression and not really anhedonic Independent with instrumental ADLs No memory problems  Having pain in right shoulder--with movements (like in shower) Some ongoing back pain Uses 1/2 tramadol many days---may use full one at time Asked him to take tylenol with the tramadol   Still smoking Has cut back with nicotine inhaler Under 1 PPD No regular cough Doesn't note trouble breathing  Had lung cancer screening last year Benign but did have aortic atherosclerosis  Some constipation Uses miralax daily to help Mostly this helps  Saw the hematologist---Stephen Bennett--but didn't go back  Swelling generally better Depending on what he eats (like crackers or chips)---he needs the fluid pill  Current Outpatient Medications on File Prior to Visit  Medication Sig Dispense Refill  . calcium carbonate (TUMS - DOSED IN MG ELEMENTAL CALCIUM) 500 MG chewable tablet Chew 2-3 tablets by mouth daily.    . cholecalciferol (VITAMIN D) 1000 units tablet Take 1,000 Units by mouth daily.    . furosemide (LASIX) 20 MG tablet Take 1 tablet (20 mg total) by mouth daily as needed. For increased morning leg swelling  90 tablet 3  . nicotine (NICOTROL) 10 MG inhaler Inhale 1 continuous puffing into the lungs as needed for smoking cessation.    . traMADol (ULTRAM) 50 MG tablet TAKE 1 TABLET (50 MG TOTAL) BY MOUTH 3 (THREE) TIMES DAILY AS NEEDED. 30 tablet 0   No current facility-administered medications on file prior to visit.    No Known Allergies  Past Medical History:  Diagnosis Date  . Compression fracture of fourth lumbar vertebra Stephen Bennett)     Past Surgical History:  Procedure Laterality Date  . SKIN CANCER EXCISION     several    Family History  Problem Relation Age of Onset  . Cancer Mother   . Heart disease Neg Hx   . Diabetes Neg Hx     Social History   Socioeconomic History  . Marital status: Divorced    Spouse name: Not on file  . Number of children: 0  . Years of education: Not on file  . Highest education level: Not on file  Occupational History  . Occupation: Therapist, art    Comment: Retired  . Occupation: Chief Operating Officer    Comment: retired  Tobacco Use  . Smoking status: Current Every Day Smoker    Packs/day: 0.50    Years: 63.00    Pack years: 31.50    Types: Cigarettes  . Smokeless tobacco: Never Used  Vaping Use  . Vaping Use: Never used  Substance and Sexual Activity  . Alcohol use: Not Currently  . Drug use: Not on file  . Sexual activity: Not on file  Other Topics Concern  . Not on file  Social History Narrative  No living will      Would want neighbor Stephen Bennett to make decisions   He does have cousins in [place]   Would accept resuscitation   May accept tube feeds   Social Determinants of Health   Financial Resource Strain: Not on file  Food Insecurity: Not on file  Transportation Needs: Not on file  Physical Activity: Not on file  Stress: Not on file  Social Connections: Not on file  Intimate Partner Violence: Not on file   Review of Systems He feels he eats fine Weight down 9# to 132# Sleeps well Wears seat belt Full  dentures--no dentist No chest pain No dizziness or syncope No heartburn---or dysphagia Voids okay---good stream. Nocturia is uncommon     Objective:   Physical Exam Constitutional:      Comments: Mild wasting  HENT:     Mouth/Throat:     Comments: No lesions Eyes:     Conjunctiva/sclera: Conjunctivae normal.     Pupils: Pupils are equal, round, and reactive to light.  Cardiovascular:     Rate and Rhythm: Normal rate and regular rhythm.     Heart sounds: No murmur heard. No gallop.      Comments: Frequent ectopy Faint pedal pulses Abdominal:     Palpations: Abdomen is soft.     Tenderness: There is no abdominal tenderness.  Musculoskeletal:     Cervical back: Neck supple.     Comments: 1+ ankle edema  Lymphadenopathy:     Cervical: No cervical adenopathy.  Skin:    Findings: No rash.  Neurological:     Mental Status: He is alert and oriented to person, place, and time.     Comments: President--- Stephen Bennett, Stephen Bennett----Stephen Bennett [id number] Recall 3/3  Psychiatric:        Mood and Affect: Mood normal.        Behavior: Behavior normal.            Assessment & Plan:

## 2020-11-30 NOTE — Assessment & Plan Note (Signed)
I have personally reviewed the Medicare Annual Wellness questionnaire and have noted 1. The patient's medical and social history 2. Their use of alcohol, tobacco or illicit drugs 3. Their current medications and supplements 4. The patient's functional ability including ADL's, fall risks, home safety risks and hearing or visual             impairment. 5. Diet and physical activities 6. Evidence for depression or mood disorders  The patients weight, height, BMI and visual acuity have been recorded in the chart I have made referrals, counseling and provided education to the patient based review of the above and I have provided the pt with a written personalized care plan for preventive services.  I have provided you with a copy of your personalized plan for preventive services. Please take the time to review along with your updated medication list.  Still adamant about no vaccines Done with cancer screening Does minimal exercise

## 2020-11-30 NOTE — Progress Notes (Signed)
Hearing Screening   125Hz  250Hz  500Hz  1000Hz  2000Hz  3000Hz  4000Hz  6000Hz  8000Hz   Right ear:           Left ear:           Comments: Pt is aware of hearing loss does not have hearing aides   Visual Acuity Screening   Right eye Left eye Both eyes  Without correction: 20/30 20/30 20/30   With correction:

## 2020-11-30 NOTE — Assessment & Plan Note (Signed)
Uses the furosemide occasionally

## 2020-12-01 LAB — COMPREHENSIVE METABOLIC PANEL
ALT: 8 U/L (ref 0–53)
AST: 12 U/L (ref 0–37)
Albumin: 4.1 g/dL (ref 3.5–5.2)
Alkaline Phosphatase: 65 U/L (ref 39–117)
BUN: 25 mg/dL — ABNORMAL HIGH (ref 6–23)
CO2: 30 mEq/L (ref 19–32)
Calcium: 9.3 mg/dL (ref 8.4–10.5)
Chloride: 105 mEq/L (ref 96–112)
Creatinine, Ser: 0.81 mg/dL (ref 0.40–1.50)
GFR: 83.42 mL/min (ref >60.00)
Glucose, Bld: 75 mg/dL (ref 70–99)
Potassium: 4.2 mEq/L (ref 3.5–5.1)
Sodium: 140 mEq/L (ref 135–145)
Total Bilirubin: 0.7 mg/dL (ref 0.2–1.2)
Total Protein: 6.4 g/dL (ref 6.0–8.3)

## 2020-12-01 LAB — CBC
HCT: 27.2 % — ABNORMAL LOW (ref 39.0–52.0)
Hemoglobin: 9.2 g/dL — ABNORMAL LOW (ref 13.0–17.0)
MCHC: 33.8 g/dL (ref 30.0–36.0)
MCV: 100.9 fl — ABNORMAL HIGH (ref 78.0–100.0)
Platelets: 241 10*3/uL (ref 150.0–400.0)
RBC: 2.69 Mil/uL — ABNORMAL LOW (ref 4.22–5.81)
RDW: 29.8 % — ABNORMAL HIGH (ref 11.5–15.5)
WBC: 7.6 10*3/uL (ref 4.0–10.5)

## 2020-12-01 LAB — T4, FREE: Free T4: 0.82 ng/dL (ref 0.60–1.60)

## 2020-12-11 ENCOUNTER — Telehealth: Payer: Self-pay | Admitting: *Deleted

## 2020-12-11 NOTE — Telephone Encounter (Signed)
Attempted to contact and schedule lung screening scan. Message left for patient to call back to schedule. 

## 2020-12-22 ENCOUNTER — Telehealth: Payer: Self-pay | Admitting: Licensed Clinical Social Worker

## 2020-12-22 NOTE — Telephone Encounter (Signed)
Left a message for patient to call back to schedule CT scan for lung screening program

## 2020-12-25 NOTE — Telephone Encounter (Signed)
Patient returned messages left in effort to schedule lung cancer screening scan. Patient left voicemail indicating that he does not want to have lung screening scan at this time. This information will be sent to his PCP.

## 2020-12-25 NOTE — Telephone Encounter (Signed)
Noted. I guess he changed his mind.

## 2020-12-26 ENCOUNTER — Other Ambulatory Visit: Payer: Self-pay | Admitting: Internal Medicine

## 2020-12-26 NOTE — Telephone Encounter (Signed)
Last filled 10-05-20 #30 Last OV 11-30-20 No Future OV [facility] [address]

## 2020-12-26 NOTE — Telephone Encounter (Signed)
Patient states that he needs a refill on his  Tramadol  Pharm[facility] [place]

## 2020-12-27 MED ORDER — TRAMADOL HCL 50 MG PO TABS: 50.0000 mg | ORAL_TABLET | Freq: Three times a day (TID) | ORAL | 0 refills | Status: DC | PRN

## 2021-01-01 ENCOUNTER — Other Ambulatory Visit: Payer: Self-pay | Admitting: Internal Medicine

## 2021-02-26 ENCOUNTER — Other Ambulatory Visit: Payer: Self-pay | Admitting: Internal Medicine

## 2021-02-26 NOTE — Telephone Encounter (Signed)
Last filled 12-27-20 #30 Last OV/[facility] 11-30-20 No Future OV [facility] [address]

## 2021-02-26 NOTE — Telephone Encounter (Signed)
Okay #30 x 0 

## 2021-02-26 NOTE — Telephone Encounter (Signed)
Called in refill on pharmacy voice mail

## 2021-06-21 ENCOUNTER — Encounter: Payer: Self-pay | Admitting: Dermatology

## 2021-06-21 ENCOUNTER — Ambulatory Visit: Payer: Medicare Other | Admitting: Dermatology

## 2021-06-21 ENCOUNTER — Other Ambulatory Visit: Payer: Self-pay

## 2021-06-21 DIAGNOSIS — D692 Other nonthrombocytopenic purpura: Secondary | ICD-10-CM

## 2021-06-21 DIAGNOSIS — L570 Actinic keratosis: Secondary | ICD-10-CM | POA: Diagnosis not present

## 2021-06-21 DIAGNOSIS — Z85828 Personal history of other malignant neoplasm of skin: Secondary | ICD-10-CM | POA: Diagnosis not present

## 2021-06-21 DIAGNOSIS — L820 Inflamed seborrheic keratosis: Secondary | ICD-10-CM | POA: Diagnosis not present

## 2021-06-21 DIAGNOSIS — L578 Other skin changes due to chronic exposure to nonionizing radiation: Secondary | ICD-10-CM

## 2021-06-21 DIAGNOSIS — Z1283 Encounter for screening for malignant neoplasm of skin: Secondary | ICD-10-CM

## 2021-06-21 NOTE — Patient Instructions (Addendum)
Levulan/PDT Treatment Common Side Effects FACE - Burning/stinging, which may be severe and last up to 24-72 hours after your treatment  - Redness, swelling and/or peeling which may last up to 4 weeks  - Scaling/crusting which may last up to 2 weeks  - Sun sensitivity (you MUST avoid sun exposure for 48-72 hours after treatment)  Care Instructions  - Okay to wash with soap and water and shampoo as normal  - If needed, you can do a cold compress (ex. Ice packs) for comfort  - If okay with your Primary Doctor, you may use analgesics such as Tylenol every 4-6 hours, not to exceed recommended dose  - You may apply Cerave Healing Ointment, Vaseline or Aquaphor  - If you have a lot of swelling you may take a Benadryl to help with this (this may cause drowsiness)  Sun Precautions  - Wear a wide brim hat for the next week if outside  - Wear a sunblock with zinc or titanium dioxide at least SPF 50 daily   We will recheck you in 10-12 weeks. If any problems, please call the office and ask to speak with a nurse.     Cryotherapy Aftercare  Wash gently with soap and water everyday.   Apply Vaseline and Band-Aid daily until healed.    If you have any questions or concerns for your doctor, please call our main line at [phone] and press option 4 to reach your doctor's medical assistant. If no one answers, please leave a voicemail as directed and we will return your call as soon as possible. Messages left after 4 pm will be answered the following business day.   You may also send [place] a message via [place]. We typically respond to MyChart messages within 1-2 business days.  For prescription refills, please ask your pharmacy to contact our office. Our fax number is [phone].  If you have an urgent issue when the clinic is closed that cannot wait until the next business day, you can page your doctor at the number below.    Please note that while we do our best to be available for  urgent issues outside of office hours, we are not available 24/7.   If you have an urgent issue and are unable to reach [place], you may choose to seek medical care at your doctor's office, retail clinic, urgent care center, or emergency room.  If you have a medical emergency, please immediately call 911 or go to the emergency department.  Pager Numbers  - Dr. [name]: [phone]  - Dr. Laurence Ferrari: [phone]  - Dr. Nicole Kindred: [phone]  In the event of inclement weather, please call our main line at [phone] for an update on the status of any delays or closures.  Dermatology Medication Tips: Please keep the boxes that topical medications come in in order to help keep track of the instructions about where and how to use these. Pharmacies typically print the medication instructions only on the boxes and not directly on the medication tubes.   If your medication is too expensive, please contact our office at [phone] option 4 or send [place] a message through [place].   We are unable to tell what your co-pay for medications will be in advance as this is different depending on your insurance coverage. However, we may be able to find a substitute medication at lower cost or fill out paperwork to get insurance to cover a needed medication.   If a prior authorization is required to get  your medication covered by your insurance company, please allow [place] 1-2 business days to complete this process.  Drug prices often vary depending on where the prescription is filled and some pharmacies may offer cheaper prices.  The website www.goodrx.com contains coupons for medications through different pharmacies. The prices here do not account for what the cost may be with help from insurance (it may be cheaper with your insurance), but the website can give you the price if you did not use any insurance.  - You can print the associated coupon and take it with your prescription to the pharmacy.  - You may also  stop by our office during regular business hours and pick up a GoodRx coupon card.  - If you need your prescription sent electronically to a different pharmacy, notify our office through [facility] or by phone at [phone] option 4.

## 2021-06-21 NOTE — Progress Notes (Signed)
New Patient Visit  Subjective  Stephen Bennett is a 80 y.o. male who presents for the following: Annual Exam (Check spots on the face, scalp and legs ). Hx of BCC, hx of SCC .  He has several areas to be evaluated today.  The following portions of the chart were reviewed this encounter and updated as appropriate:   Tobacco  Allergies  Meds  Problems  Med Hx  Surg Hx  Fam Hx     Review of Systems:  No other skin or systemic complaints except as noted in HPI or Assessment and Plan.  Objective  Well appearing patient in no apparent distress; mood and affect are within normal limits.  A focused examination was performed including face,scalp,legs. Relevant physical exam findings are noted in the Assessment and Plan.  face (16) Erythematous thin papules/macules with gritty scale.   Head - Anterior (Face) (20) Erythematous keratotic or waxy stuck-on papule or plaque.    Assessment & Plan  AK (actinic keratosis) (16) face  Destruction of lesion - face Complexity: simple   Destruction method: cryotherapy   Informed consent: discussed and consent obtained   Timeout:  patient name, date of birth, surgical site, and procedure verified Lesion destroyed using liquid nitrogen: Yes   Region frozen until ice ball extended beyond lesion: Yes   Outcome: patient tolerated procedure well with no complications   Post-procedure details: wound care instructions given    Inflamed seborrheic keratosis Head - Anterior (Face) x 20  Destruction of lesion - Head - Anterior (Face) Complexity: simple   Destruction method: cryotherapy   Informed consent: discussed and consent obtained   Timeout:  patient name, date of birth, surgical site, and procedure verified Lesion destroyed using liquid nitrogen: Yes   Region frozen until ice ball extended beyond lesion: Yes   Outcome: patient tolerated procedure well with no complications   Post-procedure details: wound care instructions given     Actinic Damage - chronic, secondary to cumulative UV radiation exposure/sun exposure over time - diffuse scaly erythematous macules with underlying dyspigmentation - Recommend daily broad spectrum sunscreen SPF 30+ to sun-exposed areas, reapply every 2 hours as needed.  - Recommend staying in the shade or wearing long sleeves, sun glasses (UVA+UVB protection) and wide brim hats (4-inch brim around the entire circumference of the hat). - Call for new or changing lesions.  chronic, secondary to cumulative UV radiation exposure/sun exposure over time - diffuse scaly erythematous macules with underlying dyspigmentation - Recommend daily broad spectrum sunscreen SPF 30+ to sun-exposed areas, reapply every 2 hours as needed.  - Recommend staying in the shade or wearing long sleeves, sun glasses (UVA+UVB protection) and wide brim hats (4-inch brim around the entire circumference of the hat). - Call for new or changing lesions.   Purpura - Chronic; persistent and recurrent.  Treatable, but not curable. - Violaceous macules and patches - Benign - Related to trauma, age, sun damage and/or use of blood thinners, chronic use of topical and/or oral steroids - Observe - Can use OTC arnica containing moisturizer such as Dermend Bruise Formula if desired - Call for worsening or other concerns   Actinic Damage - Severe, confluent actinic changes with pre-cancerous actinic keratoses  - Severe, chronic, not at goal, secondary to cumulative UV radiation exposure over time - diffuse scaly erythematous macules and papules with underlying dyspigmentation - Discussed Prescription "Field Treatment" for Severe, Chronic Confluent Actinic Changes with Pre-Cancerous Actinic Keratoses Start PDT on the face in  1  month  Field treatment involves treatment of an entire area of skin that has confluent Actinic Changes (Sun/ Ultraviolet light damage) and PreCancerous Actinic Keratoses by method of PhotoDynamic Therapy  (PDT) and/or prescription Topical Chemotherapy agents such as 5-fluorouracil, 5-fluorouracil/calcipotriene, and/or imiquimod.  The purpose is to decrease the number of clinically evident and subclinical PreCancerous lesions to prevent progression to development of skin cancer by chemically destroying early precancer changes that may or may not be visible.  It has been shown to reduce the risk of developing skin cancer in the treated area. As a result of treatment, redness, scaling, crusting, and open sores may occur during treatment course. One or more than one of these methods may be used and may have to be used several times to control, suppress and eliminate the PreCancerous changes. Discussed treatment course, expected reaction, and possible side effects. - Recommend daily broad spectrum sunscreen SPF 30+ to sun-exposed areas, reapply every 2 hours as needed.  - Staying in the shade or wearing long sleeves, sun glasses (UVA+UVB protection) and wide brim hats (4-inch brim around the entire circumference of the hat) are also recommended. - Call for new or changing lesions.  History of Basal Cell Carcinoma of the Skin Multiple see history  - No evidence of recurrence today - Recommend regular full body skin exams - Recommend daily broad spectrum sunscreen SPF 30+ to sun-exposed areas, reapply every 2 hours as needed.  - Call if any new or changing lesions are noted between office visits   History of Squamous Cell Carcinoma of the Skin Right distal calf - No evidence of recurrence today - No lymphadenopathy - Recommend regular full body skin exams - Recommend daily broad spectrum sunscreen SPF 30+ to sun-exposed areas, reapply every 2 hours as needed.  - Call if any new or changing lesions are noted between office visits    Return in about 6 months (around 12/22/2021) for AKS, PDT on face in 1 month.   IMarye Round, CMA, am acting as scribe for Sarina Ser, MD .  Documentation: I have  reviewed the above documentation for accuracy and completeness, and I agree with the above.  Sarina Ser, MD

## 2021-06-26 ENCOUNTER — Other Ambulatory Visit: Payer: Self-pay | Admitting: Internal Medicine

## 2021-06-27 NOTE — Telephone Encounter (Signed)
Last OV - 11/30/2020 Next OV - N/A Last Filled - 02/26/2021

## 2021-07-19 ENCOUNTER — Other Ambulatory Visit: Payer: Self-pay

## 2021-07-19 ENCOUNTER — Ambulatory Visit (INDEPENDENT_AMBULATORY_CARE_PROVIDER_SITE_OTHER): Payer: Medicare Other

## 2021-07-19 DIAGNOSIS — L570 Actinic keratosis: Secondary | ICD-10-CM | POA: Diagnosis not present

## 2021-07-19 MED ORDER — AMINOLEVULINIC ACID HCL 20 % EX SOLR
1.0000 "application " | Freq: Once | CUTANEOUS | Status: AC
  Administered 2021-07-19: 354 mg via TOPICAL

## 2021-07-19 NOTE — Patient Instructions (Signed)

## 2021-07-19 NOTE — Progress Notes (Signed)
1. AK (actinic keratosis) Head - Anterior (Face)  Photodynamic therapy - Head - Anterior (Face) Procedure discussed: discussed risks, benefits, side effects. and alternatives   Prep: site scrubbed/prepped with acetone   Location:  Face Number of lesions:  Multiple Type of treatment:  Blue light Aminolevulinic Acid (see MAR for details): Levulan Number of Levulan sticks used:  1 Incubation time (minutes):  60 Number of minutes under lamp:  16 Number of seconds under lamp:  40 Cooling:  Floor fan Outcome: patient tolerated procedure well with no complications   Post-procedure details: sunscreen applied and aftercare instructions given to patient    Aminolevulinic Acid HCl 20 % SOLR 354 mg - Head - Anterior (Face)      

## 2021-09-12 ENCOUNTER — Other Ambulatory Visit: Payer: Self-pay | Admitting: Internal Medicine

## 2021-09-12 NOTE — Telephone Encounter (Signed)
Have him set up wellness visit for after 11/30/21

## 2021-09-12 NOTE — Telephone Encounter (Signed)
Last filled 06-27-21 #30 Last OV - 11/30/2020 No Future [facility] [address]

## 2021-09-18 NOTE — Telephone Encounter (Signed)
Left message to call office and schedule MCW in February 2023.

## 2021-09-19 NOTE — Telephone Encounter (Signed)
Spoke to pt. Made appt 12-13-21.

## 2021-10-03 ENCOUNTER — Other Ambulatory Visit: Payer: Self-pay

## 2021-10-03 ENCOUNTER — Ambulatory Visit: Payer: Medicare Other | Admitting: Dermatology

## 2021-10-03 DIAGNOSIS — L821 Other seborrheic keratosis: Secondary | ICD-10-CM | POA: Diagnosis not present

## 2021-10-03 DIAGNOSIS — L820 Inflamed seborrheic keratosis: Secondary | ICD-10-CM | POA: Diagnosis not present

## 2021-10-03 DIAGNOSIS — L578 Other skin changes due to chronic exposure to nonionizing radiation: Secondary | ICD-10-CM | POA: Diagnosis not present

## 2021-10-03 DIAGNOSIS — L570 Actinic keratosis: Secondary | ICD-10-CM | POA: Diagnosis not present

## 2021-10-03 NOTE — Patient Instructions (Addendum)

## 2021-10-03 NOTE — Progress Notes (Signed)
   Follow-Up Visit   Subjective  Stephen Bennett is a 80 y.o. male who presents for the following: check spots (R face, the bump ~39m, gets larger at times/R face, just noticed dark spot/Bumps in scalp/Bump glabella, new) and check dark spot (Scalp, has had for years). The patient has spots, moles and lesions to be evaluated, some may be new or changing and the patient has concerns that these could be cancer.  The following portions of the chart were reviewed this encounter and updated as appropriate:   Tobacco  Allergies  Meds  Problems  Med Hx  Surg Hx  Fam Hx     Review of Systems:  No other skin or systemic complaints except as noted in HPI or Assessment and Plan.  Objective  Well appearing patient in no apparent distress; mood and affect are within normal limits.  A focused examination was performed including face, scalp. Relevant physical exam findings are noted in the Assessment and Plan.  R temple x 1, face x 2, Total = 3 (3) Erythematous keratotic or waxy stuck-on papule or plaque.   face x 21 (21) Pink scaly macules   Assessment & Plan  Inflamed seborrheic keratosis R temple x 1, face x 2, Total = 3  Destruction of lesion - R temple x 1, face x 2, Total = 3 Complexity: simple   Destruction method: cryotherapy   Informed consent: discussed and consent obtained   Timeout:  patient name, date of birth, surgical site, and procedure verified Lesion destroyed using liquid nitrogen: Yes   Region frozen until ice ball extended beyond lesion: Yes   Outcome: patient tolerated procedure well with no complications   Post-procedure details: wound care instructions given    AK (actinic keratosis) (21) face x 21  Destruction of lesion - face x 21 Complexity: simple   Destruction method: cryotherapy   Informed consent: discussed and consent obtained   Timeout:  patient name, date of birth, surgical site, and procedure verified Lesion destroyed using liquid nitrogen: Yes    Region frozen until ice ball extended beyond lesion: Yes   Outcome: patient tolerated procedure well with no complications   Post-procedure details: wound care instructions given    Actinic Damage - chronic, secondary to cumulative UV radiation exposure/sun exposure over time - diffuse scaly erythematous macules with underlying dyspigmentation - Recommend daily broad spectrum sunscreen SPF 30+ to sun-exposed areas, reapply every 2 hours as needed.  - Recommend staying in the shade or wearing long sleeves, sun glasses (UVA+UVB protection) and wide brim hats (4-inch brim around the entire circumference of the hat). - Call for new or changing lesions.  Seborrheic Keratoses - Stuck-on, waxy, tan-brown papules and/or plaques  - Benign-appearing - Discussed benign etiology and prognosis. - Observe - Call for any changes  Return for as scheduled.  I, Othelia Pulling, RMA, am acting as scribe for Sarina Ser, MD . Documentation: I have reviewed the above documentation for accuracy and completeness, and I agree with the above.  Sarina Ser, MD

## 2021-10-12 ENCOUNTER — Encounter: Payer: Self-pay | Admitting: Dermatology

## 2021-11-06 ENCOUNTER — Telehealth: Payer: Self-pay | Admitting: Internal Medicine

## 2021-11-06 NOTE — Telephone Encounter (Signed)
Spoke to pt. He will wait to discuss it at [facility] in February.

## 2021-11-06 NOTE — Telephone Encounter (Signed)
Mr. Stephen Bennett called in and wanted to know about getting a referral to a gastroenterology in [place]

## 2021-11-06 NOTE — Telephone Encounter (Signed)
Spoke to pt. Says he is past due for colonoscopy. Due every 5 years. Wants in [place].

## 2021-11-15 ENCOUNTER — Other Ambulatory Visit: Payer: Self-pay | Admitting: Internal Medicine

## 2021-11-15 NOTE — Telephone Encounter (Signed)
Last filled 09-12-21 #30 Last OV - 11/30/2020 Next OV 12-13-21 [facility] [address]

## 2021-11-15 NOTE — Telephone Encounter (Signed)
°  Encourage patient to contact the pharmacy for refills or they can request refills through [place]:  Please schedule appointment if longer than 1 year  NEXT APPOINTMENT DATE: 12/14/21  MEDICATION: tramadol  Is the patient out of medication? yes  [facility] [address] [address]  Let patient know to contact pharmacy at the end of the day to make sure medication is ready.  Please notify patient to allow 48-72 hours to process  CLINICAL FILLS OUT ALL BELOW:   LAST REFILL:  QTY:  REFILL DATE:    OTHER COMMENTS:    Okay for refill?  Please advise

## 2021-12-13 ENCOUNTER — Ambulatory Visit (INDEPENDENT_AMBULATORY_CARE_PROVIDER_SITE_OTHER): Payer: Medicare Other | Admitting: Internal Medicine

## 2021-12-13 ENCOUNTER — Encounter: Payer: Self-pay | Admitting: Internal Medicine

## 2021-12-13 ENCOUNTER — Other Ambulatory Visit: Payer: Self-pay

## 2021-12-13 VITALS — BP 114/62 | HR 59 | Temp 97.9°F | Ht 66.5 in | Wt 126.0 lb

## 2021-12-13 DIAGNOSIS — E441 Mild protein-calorie malnutrition: Secondary | ICD-10-CM | POA: Diagnosis not present

## 2021-12-13 DIAGNOSIS — Z0000 Encounter for general adult medical examination without abnormal findings: Secondary | ICD-10-CM | POA: Diagnosis not present

## 2021-12-13 DIAGNOSIS — I499 Cardiac arrhythmia, unspecified: Secondary | ICD-10-CM | POA: Diagnosis not present

## 2021-12-13 DIAGNOSIS — I872 Venous insufficiency (chronic) (peripheral): Secondary | ICD-10-CM

## 2021-12-13 DIAGNOSIS — G8929 Other chronic pain: Secondary | ICD-10-CM

## 2021-12-13 DIAGNOSIS — J439 Emphysema, unspecified: Secondary | ICD-10-CM

## 2021-12-13 DIAGNOSIS — M545 Low back pain, unspecified: Secondary | ICD-10-CM | POA: Diagnosis not present

## 2021-12-13 NOTE — Assessment & Plan Note (Signed)
Still smoking--again discussed cessation No Rx at this point

## 2021-12-13 NOTE — Assessment & Plan Note (Addendum)
Has had past PACs but still can't tell if not atrial fib Will check EKG--shows sinus bradycardia--bigeminy on the tracing Low voltage but no ischemia or hypertrophy. No sig change from 2000

## 2021-12-13 NOTE — Progress Notes (Signed)
Subjective:    Patient ID: Stephen Bennett, male    DOB: December 13, 1940, 81 y.o.   MRN: [id number]  HPI Here for Medicare wellness visit and follow up of chronic health conditions Reviewed advanced directives Reviewed other doctors---Dr Maryellen Pile No hospitalizations or surgery in past year Vision is fine--no exams Hearing is poor  No alcohol No falls Does push ups and stays busy with yard work, Social research officer, government. No true set exercise Will have occasional brief depression --losing friends, etc---but not anhedonic Independent with instrumental ADLs No memory problems  Gets some edema still--but always gone in the morning Will have lightheaded with the furosemide--but tries to limit it Normally only if he is sitting Avoids salt No chest pain No SOB No palpitations  Still smoking Trying to cut back with the nicotine inhaler (like 1/2 PPD)  He does try to eat regularly Weight down slightly Again discussed boost/ensure  Has chronic back pain Uses the tramadol prn only  Current Outpatient Medications on File Prior to Visit  Medication Sig Dispense Refill   calcium carbonate (TUMS - DOSED IN MG ELEMENTAL CALCIUM) 500 MG chewable tablet Chew 2-3 tablets by mouth daily.     cholecalciferol (VITAMIN D) 1000 units tablet Take 1,000 Units by mouth daily.     furosemide (LASIX) 20 MG tablet TAKE 1 TABLET (20 MG TOTAL) BY MOUTH DAILY AS NEEDED. FOR INCREASED MORNING LEG SWELLING 90 tablet 3   nicotine (NICOTROL) 10 MG inhaler Inhale 1 continuous puffing into the lungs as needed for smoking cessation.     traMADol (ULTRAM) 50 MG tablet TAKE 1 TABLET BY MOUTH THREE TIMES A DAY AS NEEDED 30 tablet 0   No current facility-administered medications on file prior to visit.    No Known Allergies  Past Medical History:  Diagnosis Date   Basal cell carcinoma 06/11/2018   L zygoma - excision 10/13/2018   BCC (basal cell carcinoma of skin) 06/11/2018   L cheek 5.0 cm ant to the earlobe   Compression  fracture of fourth lumbar vertebra (HCC)    Squamous cell carcinoma of skin 04/02/2018   R distal lat calf - ED&C    Past Surgical History:  Procedure Laterality Date   SKIN CANCER EXCISION     several    Family History  Problem Relation Age of Onset   Cancer Mother    Heart disease Neg Hx    Diabetes Neg Hx     Social History   Socioeconomic History   Marital status: Divorced    Spouse name: Not on file   Number of children: 0   Years of education: Not on file   Highest education level: Not on file  Occupational History   Occupation: Customer service    Comment: Retired   Occupation: Chief Operating Officer    Comment: retired  Tobacco Use   Smoking status: Every Day    Packs/day: 0.50    Years: 63.00    Pack years: 31.50    Types: Cigarettes    Passive exposure: Past   Smokeless tobacco: Never  Vaping Use   Vaping Use: Never used  Substance and Sexual Activity   Alcohol use: Not Currently   Drug use: Not on file   Sexual activity: Not on file  Other Topics Concern   Not on file  Social History Narrative   No living will      Would want neighbor [name] to make decisions   He does have cousins in [place]   Would  accept resuscitation   May accept tube feeds   Social Determinants of Health   Financial Resource Strain: Not on file  Food Insecurity: Not on file  Transportation Needs: Not on file  Physical Activity: Not on file  Stress: Not on file  Social Connections: Not on file  Intimate Partner Violence: Not on file   Review of Systems Sleep is variable. Still stays on his old 2nd shift schedule  Wears seat belt Dentures are okay---no dentist No heartburn or dysphagia Bowels move fine mostly. No blood Voids okay. Empties okay No suspicious skin lesions---does keep up with Dr [name]     Objective:   Physical Exam Constitutional:      Appearance: Normal appearance.  HENT:     Mouth/Throat:     Comments: No lesions Eyes:      Conjunctiva/sclera: Conjunctivae normal.     Pupils: Pupils are equal, round, and reactive to light.  Cardiovascular:     Rate and Rhythm: Normal rate. Rhythm irregular.     Pulses: Normal pulses.     Heart sounds: No murmur heard.   No gallop.  Pulmonary:     Effort: Pulmonary effort is normal.     Breath sounds: No wheezing or rales.     Comments: Decreased breath sounds but clear Abdominal:     Palpations: Abdomen is soft.     Tenderness: There is no abdominal tenderness.  Musculoskeletal:     Cervical back: Neck supple.     Comments: 1-2+ ankle edema only  Lymphadenopathy:     Cervical: No cervical adenopathy.  Skin:    Findings: No lesion or rash.  Neurological:     General: No focal deficit present.     Mental Status: He is alert and oriented to person, place, and time.     Comments: Mini-Cog okay---clock good, memory 2/3  Psychiatric:        Mood and Affect: Mood normal.        Behavior: Behavior normal.           Assessment & Plan:

## 2021-12-13 NOTE — Assessment & Plan Note (Signed)
Uses the furosemide 20 daily prn Always better in the AM

## 2021-12-13 NOTE — Assessment & Plan Note (Signed)
Does okay with occasional tramadol 25-50mg 

## 2021-12-13 NOTE — Assessment & Plan Note (Signed)
Discussed higher calorie food and boost

## 2021-12-13 NOTE — Assessment & Plan Note (Signed)
I have personally reviewed the Medicare Annual Wellness questionnaire and have noted 1. The patient's medical and social history 2. Their use of alcohol, tobacco or illicit drugs 3. Their current medications and supplements 4. The patient's functional ability including ADL's, fall risks, home safety risks and hearing or visual             impairment. 5. Diet and physical activities 6. Evidence for depression or mood disorders  The patients weight, height, BMI and visual acuity have been recorded in the chart I have made referrals, counseling and provided education to the patient based review of the above and I have provided the pt with a written personalized care plan for preventive services.  I have provided you with a copy of your personalized plan for preventive services. Please take the time to review along with your updated medication list.  Discussed more exercise Adamant about no vaccines Done with cancer screening (colon, prostate)

## 2021-12-13 NOTE — Progress Notes (Signed)
Vision Screening   Right eye Left eye Both eyes  Without correction 20/30 20/30 20/30  With correction     Hearing Screening - Comments:: Did not pass whisper test  

## 2021-12-14 LAB — COMPREHENSIVE METABOLIC PANEL
ALT: 8 U/L (ref 0–53)
AST: 11 U/L (ref 0–37)
Albumin: 4.1 g/dL (ref 3.5–5.2)
Alkaline Phosphatase: 57 U/L (ref 39–117)
BUN: 24 mg/dL — ABNORMAL HIGH (ref 6–23)
CO2: 33 mEq/L — ABNORMAL HIGH (ref 19–32)
Calcium: 8.8 mg/dL (ref 8.4–10.5)
Chloride: 106 mEq/L (ref 96–112)
Creatinine, Ser: 0.67 mg/dL (ref 0.40–1.50)
GFR: 87.7 mL/min (ref >60.00)
Glucose, Bld: 80 mg/dL (ref 70–99)
Potassium: 4 mEq/L (ref 3.5–5.1)
Sodium: 141 mEq/L (ref 135–145)
Total Bilirubin: 0.8 mg/dL (ref 0.2–1.2)
Total Protein: 5.9 g/dL — ABNORMAL LOW (ref 6.0–8.3)

## 2021-12-14 LAB — T4, FREE: Free T4: 0.93 ng/dL (ref 0.60–1.60)

## 2021-12-14 LAB — CBC
HCT: 25.7 % — ABNORMAL LOW (ref 39.0–52.0)
Hemoglobin: 8.6 g/dL — ABNORMAL LOW (ref 13.0–17.0)
MCHC: 33.2 g/dL (ref 30.0–36.0)
MCV: 99.7 fl (ref 78.0–100.0)
Platelets: 175 10*3/uL (ref 150.0–400.0)
RBC: 2.58 Mil/uL — ABNORMAL LOW (ref 4.22–5.81)
RDW: 30.4 % — ABNORMAL HIGH (ref 11.5–15.5)
WBC: 6.1 10*3/uL (ref 4.0–10.5)

## 2021-12-20 ENCOUNTER — Telehealth: Payer: Self-pay

## 2021-12-20 ENCOUNTER — Ambulatory Visit: Payer: Medicare Other | Admitting: Dermatology

## 2021-12-20 NOTE — Telephone Encounter (Addendum)
Left detailed message on VM per DPR with Dr Alla German message below. Asked that he call to let us know what he wants to do.  ----- Message from Venia Carbon, MD sent at 12/14/2021  2:57 PM EST ----- Please call him The labs are about the same---but do show low protein (malnutrition) and low blood count again (anemia). It would probably be best for you to see a blood specialist to consider iron intravenously---and a GI specialist to consider colonoscopy to be sure you aren't losing blood from something in the colon. At least, we should do a fecal test (send FIT) if he is willing. He should also take a multivitamin with B12 and folate and over the counter iron (even if he doesn't do any other testing)

## 2021-12-20 NOTE — Telephone Encounter (Signed)
Left message to apologize for not being able to speak to him when he came in the office this afternoon. Asked him to give me a call before 5pm today if possible. I will also try to reach him again tomorrow.

## 2022-01-01 ENCOUNTER — Other Ambulatory Visit: Payer: Self-pay

## 2022-01-01 MED ORDER — TRAMADOL HCL 50 MG PO TABS: 50.0000 mg | ORAL_TABLET | Freq: Three times a day (TID) | ORAL | 0 refills | Status: DC | PRN

## 2022-01-01 NOTE — Telephone Encounter (Signed)
Last filled 11-15-21 #30 ?Last OV 12-13-21 ?No Future OV ?[facility] [address] ?

## 2022-01-15 ENCOUNTER — Other Ambulatory Visit: Payer: Self-pay | Admitting: Internal Medicine

## 2022-01-30 ENCOUNTER — Ambulatory Visit: Payer: Medicare Other | Admitting: Dermatology

## 2022-01-30 DIAGNOSIS — L570 Actinic keratosis: Secondary | ICD-10-CM | POA: Diagnosis not present

## 2022-01-30 DIAGNOSIS — L821 Other seborrheic keratosis: Secondary | ICD-10-CM

## 2022-01-30 DIAGNOSIS — C44629 Squamous cell carcinoma of skin of left upper limb, including shoulder: Secondary | ICD-10-CM

## 2022-01-30 DIAGNOSIS — L578 Other skin changes due to chronic exposure to nonionizing radiation: Secondary | ICD-10-CM

## 2022-01-30 DIAGNOSIS — D485 Neoplasm of uncertain behavior of skin: Secondary | ICD-10-CM

## 2022-01-30 DIAGNOSIS — L820 Inflamed seborrheic keratosis: Secondary | ICD-10-CM | POA: Diagnosis not present

## 2022-01-30 DIAGNOSIS — L739 Follicular disorder, unspecified: Secondary | ICD-10-CM

## 2022-01-30 MED ORDER — MUPIROCIN 2 % EX OINT: TOPICAL_OINTMENT | CUTANEOUS | 1 refills | Status: DC

## 2022-01-30 MED ORDER — FLUOROURACIL 5 % EX CREA: TOPICAL_CREAM | CUTANEOUS | 0 refills | Status: DC

## 2022-01-30 NOTE — Progress Notes (Signed)
? ?Follow-Up Visit ?  ?Subjective  ?Stephen Bennett is a 81 y.o. male who presents for the following: Actinic Keratosis (Patient is here to check sun exposed areas for new or persistent precancerous skin lesions.). The patient has spots, moles and lesions to be evaluated, some may be new or changing and the patient has concerns that these could be cancer. ? ?The following portions of the chart were reviewed this encounter and updated as appropriate:  ? Tobacco  Allergies  Meds  Problems  Med Hx  Surg Hx  Fam Hx   ?  ?Review of Systems:  No other skin or systemic complaints except as noted in HPI or Assessment and Plan. ? ?Objective  ?Well appearing patient in no apparent distress; mood and affect are within normal limits. ? ?A focused examination was performed including the face, scalp, and ears. Relevant physical exam findings are noted in the Assessment and Plan. ? ?Face ?Erythematous thin papules/macules with gritty scale.  ? ?L med bicep ?0.6 cm hyperkeratotic papule. ? ?Scalp ?Erythematous papule.  ? ?Nose, glabella (2) ?Erythematous stuck-on, waxy papule or plaque ? ? ?Assessment & Plan  ?AK (actinic keratosis) ?Face ?- Start 5FU/Calcipotriene cream to the nose and cheeks BID x 1 week. Then in mid May treat the forehead and temples BID x 1 week.  ?fluorouracil (EFUDEX) 5 % cream - Face ?Apply to the nose and cheeks BID x 1 week. In one month (mid May) apply to the forehead and temples BID x 1 week. ?Actinic Damage - Severe, confluent actinic changes with pre-cancerous actinic keratoses  ?- Severe, chronic, not at goal, secondary to cumulative UV radiation exposure over time ?- diffuse scaly erythematous macules and papules with underlying dyspigmentation ?- Discussed Prescription "Field Treatment" for Severe, Chronic Confluent Actinic Changes with Pre-Cancerous Actinic Keratoses ?Field treatment involves treatment of an entire area of skin that has confluent Actinic Changes (Sun/ Ultraviolet light  damage) and PreCancerous Actinic Keratoses by method of PhotoDynamic Therapy (PDT) and/or prescription Topical Chemotherapy agents such as 5-fluorouracil, 5-fluorouracil/calcipotriene, and/or imiquimod.  The purpose is to decrease the number of clinically evident and subclinical PreCancerous lesions to prevent progression to development of skin cancer by chemically destroying early precancer changes that may or may not be visible.  It has been shown to reduce the risk of developing skin cancer in the treated area. As a result of treatment, redness, scaling, crusting, and open sores may occur during treatment course. One or more than one of these methods may be used and may have to be used several times to control, suppress and eliminate the PreCancerous changes. Discussed treatment course, expected reaction, and possible side effects. ?- Recommend daily broad spectrum sunscreen SPF 30+ to sun-exposed areas, reapply every 2 hours as needed.  ?- Staying in the shade or wearing long sleeves, sun glasses (UVA+UVB protection) and wide brim hats (4-inch brim around the entire circumference of the hat) are also recommended. ?- Call for new or changing lesions. ? Start 5FU/Calcipotriene cream to the nose and cheeks BID x 1 week. Then in mid May treat the forehead and temples BID x 1 week.  ? ?Neoplasm of uncertain behavior of skin ?L med bicep ?Epidermal / dermal shaving ? ?Lesion diameter (cm):  0.6 ?Informed consent: discussed and consent obtained   ?Timeout: patient name, date of birth, surgical site, and procedure verified   ?Procedure prep:  Patient was prepped and draped in usual sterile fashion ?Prep type:  Isopropyl alcohol ?Anesthesia: the lesion was anesthetized  in a standard fashion   ?Anesthetic:  1% lidocaine w/ epinephrine 1-100,000 buffered w/ 8.4% NaHCO3 ?Instrument used: flexible razor blade   ?Hemostasis achieved with: pressure, aluminum chloride and electrodesiccation   ?Outcome: patient tolerated  procedure well   ?Post-procedure details: sterile dressing applied and wound care instructions given   ?Dressing type: bandage and petrolatum   ? ?Destruction of lesion ?Complexity: extensive   ?Destruction method: electrodesiccation and curettage   ?Informed consent: discussed and consent obtained   ?Timeout:  patient name, date of birth, surgical site, and procedure verified ?Procedure prep:  Patient was prepped and draped in usual sterile fashion ?Prep type:  Isopropyl alcohol ?Anesthesia: the lesion was anesthetized in a standard fashion   ?Anesthetic:  1% lidocaine w/ epinephrine 1-100,000 buffered w/ 8.4% NaHCO3 ?Curettage performed in three different directions: Yes   ?Electrodesiccation performed over the curetted area: Yes   ?Final wound size (cm):  1.1 cm ?Hemostasis achieved with:  pressure, aluminum chloride and electrodesiccation ?Outcome: patient tolerated procedure well with no complications   ?Post-procedure details: sterile dressing applied and wound care instructions given   ?Dressing type: bandage and petrolatum   ? ?Specimen 1 - Surgical pathology ?Differential Diagnosis: D48.5 r/o SCC ?ED&C today  ?Check Margins: No ? ?Folliculitis ?Scalp ?Start Mupirocin 2% ointment to aa QD until healed. ?mupirocin ointment (BACTROBAN) 2 % - Scalp ?Apply to bumps on the scalp QD until healed. ? ?Inflamed seborrheic keratosis (2) ?Nose, glabella ?Destruction of lesion - Nose, glabella ?Complexity: simple   ?Destruction method: cryotherapy   ?Informed consent: discussed and consent obtained   ?Timeout:  patient name, date of birth, surgical site, and procedure verified ?Lesion destroyed using liquid nitrogen: Yes   ?Region frozen until ice ball extended beyond lesion: Yes   ?Outcome: patient tolerated procedure well with no complications   ?Post-procedure details: wound care instructions given   ? ?-Seborrheic Keratoses ?- Stuck-on, waxy, tan-brown papules and/or plaques  ?- Benign-appearing ?- Discussed benign  etiology and prognosis. ?- Observe ?- Call for any changes ? ?Return in about 6 months (around 08/01/2022) for AK follow up . ? ?IRudell Cobb, CMA, am acting as scribe for Sarina Ser, MD . ?Documentation: I have reviewed the above documentation for accuracy and completeness, and I agree with the above. ? ?Sarina Ser, MD ? ?

## 2022-01-30 NOTE — Patient Instructions (Signed)
5-Fluorouracil/Calcipotriene Patient Education  ? ?Actinic keratoses are the dry, red scaly spots on the skin caused by sun damage. A portion of these spots can turn into skin cancer with time, and treating them can help prevent development of skin cancer.  ? ?Treatment of these spots requires removal of the defective skin cells. There are various ways to remove actinic keratoses, including freezing with liquid nitrogen, treatment with creams, or treatment with a blue light procedure in the office.  ? ?5-fluorouracil cream is a topical cream used to treat actinic keratoses. It works by interfering with the growth of abnormal fast-growing skin cells, such as actinic keratoses. These cells peel off and are replaced by healthy ones.  ? ?5-fluorouracil/calcipotriene is a combination of the 5-fluorouracil cream with a vitamin D analog cream called calcipotriene. The calcipotriene alone does not treat actinic keratoses. However, when it is combined with 5-fluorouracil, it helps the 5-fluorouracil treat the actinic keratoses much faster so that the same results can be achieved with a much shorter treatment time. ? ?INSTRUCTIONS FOR 5-FLUOROURACIL/CALCIPOTRIENE CREAM:  ? ?5-fluorouracil/calcipotriene cream typically only needs to be used for 4-7 days. A thin layer should be applied twice a day to the treatment areas recommended by your physician.  ? ?If your physician prescribed you separate tubes of 5-fluourouracil and calcipotriene, apply a thin layer of 5-fluorouracil followed by a thin layer of calcipotriene.  ? ?Avoid contact with your eyes, nostrils, and mouth. Do not use 5-fluorouracil/calcipotriene cream on infected or open wounds.  ? ?You will develop redness, irritation and some crusting at areas where you have pre-cancer damage/actinic keratoses. IF YOU DEVELOP PAIN, BLEEDING, OR SIGNIFICANT CRUSTING, STOP THE TREATMENT EARLY - you have already gotten a good response and the actinic keratoses should clear up  well. ? ?Wash your hands after applying 5-fluorouracil 5% cream on your skin.  ? ?A moisturizer or sunscreen with a minimum SPF 30 should be applied each morning.  ? ?Once you have finished the treatment, you can apply a thin layer of Vaseline twice a day to irritated areas to soothe and calm the areas more quickly. If you experience significant discomfort, contact your physician. ? ?For some patients it is necessary to repeat the treatment for best results. ? ?SIDE EFFECTS: When using 5-fluorouracil/calcipotriene cream, you may have mild irritation, such as redness, dryness, swelling, or a mild burning sensation. This usually resolves within 2 weeks. The more actinic keratoses you have, the more redness and inflammation you can expect during treatment. Eye irritation has been reported rarely. If this occurs, please let us know.  ? ?If you have any trouble using this cream, please call the office. If you have any other questions about this information, please do not hesitate to ask me before you leave the office.  ? ?5-fluorouracil/calcipotriene cream is is a type of field treatment used to treat precancers, thin skin cancers, and areas of sun damage. Expected reaction includes irritation and mild inflammation potentially progressing to more severe inflammation including redness, scaling, crusting and open sores/erosions.  If too much irritation occurs, ensure application of only a thin layer and decrease frequency of use to achieve a tolerable level of inflammation. Recommend applying Vaseline ointment to open sores as needed.  Minimize sun exposure while under treatment. Recommend daily broad spectrum sunscreen SPF 30+ to sun-exposed areas, reapply every 2 hours as needed.  ? ?  ?Start 5-fluorouracil/calcipotriene cream twice a day for 7 days to affected areas including the nose and cheeks. Then one  month after that apply to the forehead and temples twice daily for 7 days. Prescription sent to [facility]  Patient provided with contact information for pharmacy and advised the pharmacy will mail the prescription to their home. Patient provided with handout reviewing treatment course and side effects and advised to call or message [place] on MyChart with any concerns.  ? ? ?If You Need Anything After Your Visit ? ?If you have any questions or concerns for your doctor, please call our main line at [phone] and press option 4 to reach your doctor's medical assistant. If no one answers, please leave a voicemail as directed and we will return your call as soon as possible. Messages left after 4 pm will be answered the following business day.  ? ?You may also send [place] a message via MyChart. We typically respond to MyChart messages within 1-2 business days. ? ?For prescription refills, please ask your pharmacy to contact our office. Our fax number is [phone]. ? ?If you have an urgent issue when the clinic is closed that cannot wait until the next business day, you can page your doctor at the number below.   ? ?Please note that while we do our best to be available for urgent issues outside of office hours, we are not available 24/7.  ? ?If you have an urgent issue and are unable to reach [place], you may choose to seek medical care at your doctor's office, retail clinic, urgent care center, or emergency room. ? ?If you have a medical emergency, please immediately call 911 or go to the emergency department. ? ?Pager Numbers ? ?- Dr. [name]: [phone] ? ?- Dr. Laurence Ferrari: [phone] ? ?- Dr. Nicole Kindred: [phone] ? ?In the event of inclement weather, please call our main line at [phone] for an update on the status of any delays or closures. ? ?Dermatology Medication Tips: ?Please keep the boxes that topical medications come in in order to help keep track of the instructions about where and how to use these. Pharmacies typically print the medication instructions only on the boxes and not directly on the medication tubes.   ? ?If your medication is too expensive, please contact our office at [phone] option 4 or send [place] a message through [employer].  ? ?We are unable to tell what your co-pay for medications will be in advance as this is different depending on your insurance coverage. However, we may be able to find a substitute medication at lower cost or fill out paperwork to get insurance to cover a needed medication.  ? ?If a prior authorization is required to get your medication covered by your insurance company, please allow [place] 1-2 business days to complete this process. ? ?Drug prices often vary depending on where the prescription is filled and some pharmacies may offer cheaper prices. ? ?The website www.goodrx.com contains coupons for medications through different pharmacies. The prices here do not account for what the cost may be with help from insurance (it may be cheaper with your insurance), but the website can give you the price if you did not use any insurance.  ?- You can print the associated coupon and take it with your prescription to the pharmacy.  ?- You may also stop by our office during regular business hours and pick up a GoodRx coupon card.  ?- If you need your prescription sent electronically to a different pharmacy, notify our office through [facility] or by phone at [phone] option 4. ? ? ? ? ?Si  Usted Necesita Algo Despu?s de Su Visita ? ?Tambi?n puede enviarnos un mensaje a trav?s de MyChart. Por lo general respondemos a los mensajes de MyChart en el transcurso de 1 a 2 d?as h?biles. ? ?Para renovar recetas, por favor pida a su farmacia que se ponga en contacto con nuestra oficina. Nuestro n?mero de fax es el [phone]. ? ?Si tiene un asunto urgente cuando la cl?nica est? cerrada y que no puede esperar hasta el siguiente d?a h?bil, puede llamar/localizar a su doctor(a) al n?mero que aparece a continuaci?n.  ? ?Por favor, tenga en cuenta que aunque hacemos todo lo posible para estar  disponibles para asuntos urgentes fuera del horario de oficina, no estamos disponibles las 24 horas del d?a, los 7 d?as de la semana.  ? ?Si tiene un problema urgente y no puede comunicarse con nosotros, puede optar po

## 2022-01-31 ENCOUNTER — Encounter: Payer: Self-pay | Admitting: Dermatology

## 2022-02-04 ENCOUNTER — Telehealth: Payer: Self-pay

## 2022-02-04 NOTE — Telephone Encounter (Signed)
-----   Message from Ralene Bathe, MD sent at 02/03/2022  8:05 PM EDT ----- ?Diagnosis ?Skin , left med bicep ?WELL DIFFERENTIATED SQUAMOUS CELL CARCINOMA WITH SUPERFICIAL INFILTRATION, CLOSE TO MARGIN ? ?Cancer - SCC ?Already treated ?Recheck next visit ?

## 2022-02-04 NOTE — Telephone Encounter (Signed)
Left message on voicemail to return my call.  

## 2022-02-06 ENCOUNTER — Telehealth: Payer: Self-pay

## 2022-02-06 NOTE — Telephone Encounter (Signed)
Patient informed of pathology results. He had questions about how to use 5FU/Calcipotriene mix and stated that his instruction said to apply TID. I advised patient to use BID x 1 week and wait one month before treating the other areas of the face.  ?

## 2022-02-06 NOTE — Telephone Encounter (Signed)
-----   Message from Ralene Bathe, MD sent at 02/03/2022  8:05 PM EDT ----- ?Diagnosis ?Skin , left med bicep ?WELL DIFFERENTIATED SQUAMOUS CELL CARCINOMA WITH SUPERFICIAL INFILTRATION, CLOSE TO MARGIN ? ?Cancer - SCC ?Already treated ?Recheck next visit ?

## 2022-03-07 ENCOUNTER — Other Ambulatory Visit: Payer: Self-pay | Admitting: Internal Medicine

## 2022-03-07 MED ORDER — TRAMADOL HCL 50 MG PO TABS: 50.0000 mg | ORAL_TABLET | Freq: Three times a day (TID) | ORAL | 0 refills | Status: DC | PRN

## 2022-03-07 NOTE — Telephone Encounter (Signed)
Last filled 01-01-22 #30 ?Last OV 12-13-21 ?No Future OV ?[facility] [address] ?

## 2022-03-07 NOTE — Telephone Encounter (Signed)
?  Encourage patient to contact the pharmacy for refills or they can request refills through [facility] ? ?Did the patient contact the pharmacy:  n ? ? ?LAST APPOINTMENT DATE:  2.16.23 ?NEXT APPOINTMENT DATE: no scheduled yet ? ?MEDICATION: traMADol (ULTRAM) 50 MG tablet ? ?I ? ?[id number]Ph: [phone] ? ?Let patient know to contact pharmacy at the end of the day to make sure medication is ready. ? ?Please notify patient to allow 48-72 hours to process ?  ?

## 2022-04-09 ENCOUNTER — Ambulatory Visit: Payer: Medicare Other | Admitting: Dermatology

## 2022-04-09 ENCOUNTER — Encounter: Payer: Self-pay | Admitting: Dermatology

## 2022-04-09 DIAGNOSIS — L821 Other seborrheic keratosis: Secondary | ICD-10-CM | POA: Diagnosis not present

## 2022-04-09 DIAGNOSIS — L820 Inflamed seborrheic keratosis: Secondary | ICD-10-CM

## 2022-04-09 DIAGNOSIS — L578 Other skin changes due to chronic exposure to nonionizing radiation: Secondary | ICD-10-CM | POA: Diagnosis not present

## 2022-04-09 NOTE — Progress Notes (Signed)
   Follow-Up Visit   Subjective  Stephen Bennett is a 81 y.o. male who presents for the following: Irregular skin lesion (On the R cheek/sideburn area - scaly and irritated, patient finished 5FU/Calcipotriene cream a few days ago. Pt is concerned and would like area checked today). The patient has spots, moles and lesions to be evaluated, some may be new or changing and the patient has concerns that these could be cancer.  The following portions of the chart were reviewed this encounter and updated as appropriate:   Tobacco  Allergies  Meds  Problems  Med Hx  Surg Hx  Fam Hx     Review of Systems:  No other skin or systemic complaints except as noted in HPI or Assessment and Plan.  Objective  Well appearing patient in no apparent distress; mood and affect are within normal limits.  A focused examination was performed including the face. Relevant physical exam findings are noted in the Assessment and Plan.  R sup sideburn x 1, R inf sideburn x 1, R zygoma x 1 (3) Erythematous stuck-on, waxy papule or plaque   Assessment & Plan  Inflamed seborrheic keratosis (3) R sup sideburn x 1, R inf sideburn x 1, R zygoma x 1  Destruction of lesion - R sup sideburn x 1, R inf sideburn x 1, R zygoma x 1 Complexity: simple   Destruction method: cryotherapy   Informed consent: discussed and consent obtained   Timeout:  patient name, date of birth, surgical site, and procedure verified Lesion destroyed using liquid nitrogen: Yes   Region frozen until ice ball extended beyond lesion: Yes   Outcome: patient tolerated procedure well with no complications   Post-procedure details: wound care instructions given    Actinic Damage - chronic, secondary to cumulative UV radiation exposure/sun exposure over time - diffuse scaly erythematous macules with underlying dyspigmentation - Recommend daily broad spectrum sunscreen SPF 30+ to sun-exposed areas, reapply every 2 hours as needed.  - Recommend  staying in the shade or wearing long sleeves, sun glasses (UVA+UVB protection) and wide brim hats (4-inch brim around the entire circumference of the hat). - Call for new or changing lesions.  Seborrheic Keratoses - Stuck-on, waxy, tan-brown papules and/or plaques  - Benign-appearing - Discussed benign etiology and prognosis. - Observe - Call for any changes  Return in about 6 months (around 10/09/2022) for appointment as scheduled.  Luther Redo, CMA, am acting as scribe for Sarina Ser, MD . Documentation: I have reviewed the above documentation for accuracy and completeness, and I agree with the above.  Sarina Ser, MD

## 2022-04-09 NOTE — Patient Instructions (Signed)
Due to recent changes in healthcare laws, you may see results of your pathology and/or laboratory studies on MyChart before the doctors have had a chance to review them. We understand that in some cases there may be results that are confusing or concerning to you. Please understand that not all results are received at the same time and often the doctors may need to interpret multiple results in order to provide you with the best plan of care or course of treatment. Therefore, we ask that you please give us 2 business days to thoroughly review all your results before contacting the office for clarification. Should we see a critical lab result, you will be contacted sooner.   If You Need Anything After Your Visit  If you have any questions or concerns for your doctor, please call our main line at [phone] and press option 4 to reach your doctor's medical assistant. If no one answers, please leave a voicemail as directed and we will return your call as soon as possible. Messages left after 4 pm will be answered the following business day.   You may also send us a message via MyChart. We typically respond to MyChart messages within 1-2 business days.  For prescription refills, please ask your pharmacy to contact our office. Our fax number is [phone].  If you have an urgent issue when the clinic is closed that cannot wait until the next business day, you can page your doctor at the number below.    Please note that while we do our best to be available for urgent issues outside of office hours, we are not available 24/7.   If you have an urgent issue and are unable to reach us, you may choose to seek medical care at your doctor's office, retail clinic, urgent care center, or emergency room.  If you have a medical emergency, please immediately call 911 or go to the emergency department.  Pager Numbers  - Dr. Kowalski: [phone]  - Dr. Moye: [phone]  - Dr. Stewart:  [phone]  In the event of inclement weather, please call our main line at [phone] for an update on the status of any delays or closures.  Dermatology Medication Tips: Please keep the boxes that topical medications come in in order to help keep track of the instructions about where and how to use these. Pharmacies typically print the medication instructions only on the boxes and not directly on the medication tubes.   If your medication is too expensive, please contact our office at [phone] option 4 or send us a message through MyChart.   We are unable to tell what your co-pay for medications will be in advance as this is different depending on your insurance coverage. However, we may be able to find a substitute medication at lower cost or fill out paperwork to get insurance to cover a needed medication.   If a prior authorization is required to get your medication covered by your insurance company, please allow us 1-2 business days to complete this process.  Drug prices often vary depending on where the prescription is filled and some pharmacies may offer cheaper prices.  The website www.goodrx.com contains coupons for medications through different pharmacies. The prices here do not account for what the cost may be with help from insurance (it may be cheaper with your insurance), but the website can give you the price if you did not use any insurance.  - You can print the associated coupon and take it with   your prescription to the pharmacy.  - You may also stop by our office during regular business hours and pick up a GoodRx coupon card.  - If you need your prescription sent electronically to a different pharmacy, notify our office through [place] MyChart or by phone at [phone] option 4.     Si Usted Necesita Algo Despus de Su Visita  Tambin puede enviarnos un mensaje a travs de MyChart. Por lo general respondemos a los mensajes de MyChart en el transcurso de 1 a 2  das hbiles.  Para renovar recetas, por favor pida a su farmacia que se ponga en contacto con nuestra oficina. Nuestro nmero de fax es el [phone].  Si tiene un asunto urgente cuando la clnica est cerrada y que no puede esperar hasta el siguiente da hbil, puede llamar/localizar a su doctor(a) al nmero que aparece a continuacin.   Por favor, tenga en cuenta que aunque hacemos todo lo posible para estar disponibles para asuntos urgentes fuera del horario de oficina, no estamos disponibles las 24 horas del da, los 7 das de la semana.   Si tiene un problema urgente y no puede comunicarse con nosotros, puede optar por buscar atencin mdica  en el consultorio de su doctor(a), en una clnica privada, en un centro de atencin urgente o en una sala de emergencias.  Si tiene una emergencia mdica, por favor llame inmediatamente al 911 o vaya a la sala de emergencias.  Nmeros de bper  - Dr. Kowalski: [phone]  - Dra. Moye: [phone]  - Dra. Stewart: [phone]  En caso de inclemencias del tiempo, por favor llame a nuestra lnea principal al [phone] para una actualizacin sobre el estado de cualquier retraso o cierre.  Consejos para la medicacin en dermatologa: Por favor, guarde las cajas en las que vienen los medicamentos de uso tpico para ayudarle a seguir las instrucciones sobre dnde y cmo usarlos. Las farmacias generalmente imprimen las instrucciones del medicamento slo en las cajas y no directamente en los tubos del medicamento.   Si su medicamento es muy caro, por favor, pngase en contacto con nuestra oficina llamando al [phone] y presione la opcin 4 o envenos un mensaje a travs de MyChart.   No podemos decirle cul ser su copago por los medicamentos por adelantado ya que esto es diferente dependiendo de la cobertura de su seguro. Sin embargo, es posible que podamos encontrar un medicamento sustituto a menor costo o llenar un formulario para que el  seguro cubra el medicamento que se considera necesario.   Si se requiere una autorizacin previa para que su compaa de seguros cubra su medicamento, por favor permtanos de 1 a 2 das hbiles para completar este proceso.  Los precios de los medicamentos varan con frecuencia dependiendo del lugar de dnde se surte la receta y alguna farmacias pueden ofrecer precios ms baratos.  El sitio web www.goodrx.com tiene cupones para medicamentos de diferentes farmacias. Los precios aqu no tienen en cuenta lo que podra costar con la ayuda del seguro (puede ser ms barato con su seguro), pero el sitio web puede darle el precio si no utiliz ningn seguro.  - Puede imprimir el cupn correspondiente y llevarlo con su receta a la farmacia.  - Tambin puede pasar por nuestra oficina durante el horario de atencin regular y recoger una tarjeta de cupones de GoodRx.  - Si necesita que su receta se enve electrnicamente a una farmacia diferente, informe a nuestra oficina a travs de MyChart de [place]   o por telfono llamando al [phone] y presione la opcin 4.  

## 2022-05-03 ENCOUNTER — Telehealth: Payer: Self-pay

## 2022-05-03 MED ORDER — TRAMADOL HCL 50 MG PO TABS: 50.0000 mg | ORAL_TABLET | Freq: Three times a day (TID) | ORAL | 0 refills | Status: DC | PRN

## 2022-05-03 NOTE — Telephone Encounter (Signed)
Last filled on 03/07/22  Last ov 12/13/21

## 2022-05-03 NOTE — Addendum Note (Signed)
Addended by: Viviana Simpler I on: 05/03/2022 01:01 PM   Modules accepted: Orders

## 2022-05-03 NOTE — Telephone Encounter (Signed)
MEDICATION: traMADol (ULTRAM) 50 MG tablet  PHARMACY: [id number] [place] - [address]  Comments: Patient has two pills left.   **Let patient know to contact pharmacy at the end of the day to make sure medication is ready. **  ** Please notify patient to allow 48-72 hours to process**  **Encourage patient to contact the pharmacy for refills or they can [facility] refills through [facility]*

## 2022-05-13 ENCOUNTER — Encounter: Payer: Self-pay | Admitting: Internal Medicine

## 2022-05-13 ENCOUNTER — Ambulatory Visit (INDEPENDENT_AMBULATORY_CARE_PROVIDER_SITE_OTHER): Payer: Medicare Other | Admitting: Internal Medicine

## 2022-05-13 VITALS — BP 102/60 | HR 71 | Temp 98.0°F | Ht 66.5 in | Wt 129.0 lb

## 2022-05-13 DIAGNOSIS — D508 Other iron deficiency anemias: Secondary | ICD-10-CM | POA: Diagnosis not present

## 2022-05-13 DIAGNOSIS — I872 Venous insufficiency (chronic) (peripheral): Secondary | ICD-10-CM

## 2022-05-13 DIAGNOSIS — J439 Emphysema, unspecified: Secondary | ICD-10-CM

## 2022-05-13 DIAGNOSIS — E441 Mild protein-calorie malnutrition: Secondary | ICD-10-CM

## 2022-05-13 NOTE — Assessment & Plan Note (Signed)
Uses the furosemide intermittently (better in AM) I suspect his edema may also be nutritional and may be related to anemia (if persists)

## 2022-05-13 NOTE — Addendum Note (Signed)
Addended by: Ellamae Sia on: 05/13/2022 02:31 PM   Modules accepted: Orders

## 2022-05-13 NOTE — Assessment & Plan Note (Signed)
Weight up slightly He does seem to have more protein in his diet Will recheck labs

## 2022-05-13 NOTE — Assessment & Plan Note (Signed)
Has cut back on cigarettes Using nicotine inhaler mostly

## 2022-05-13 NOTE — Progress Notes (Signed)
Subjective:    Patient ID: Stephen Bennett, male    DOB: [birth year], 81 y.o.   MRN: [id number]  HPI Here due to worsening edema  Notes more prominent swelling n his ankles Also bruising easily Reviewed his past anemia---but never got heme visit set up Is taking iron supplements now  Trying to eat better Shows me his balanced menu and some supplements Is also taking calcium---discussed he needs vitamin D with this  No chest pain No SOB Takes the furosemide prn---not often (most mornings his feet are okay)  Current Outpatient Medications on File Prior to Visit  Medication Sig Dispense Refill   calcium carbonate (TUMS - DOSED IN MG ELEMENTAL CALCIUM) 500 MG chewable tablet Chew 2-3 tablets by mouth daily.     cholecalciferol (VITAMIN D) 1000 units tablet Take 1,000 Units by mouth daily.     furosemide (LASIX) 20 MG tablet TAKE 1 TABLET (20 MG TOTAL) BY MOUTH DAILY AS NEEDED. FOR INCREASED MORNING LEG SWELLING 90 tablet 3   mupirocin ointment (BACTROBAN) 2 % Apply to bumps on the scalp QD until healed. 22 g 1   nicotine (NICOTROL) 10 MG inhaler Inhale 1 continuous puffing into the lungs as needed for smoking cessation.     traMADol (ULTRAM) 50 MG tablet Take 1 tablet (50 mg total) by mouth 3 (three) times daily as needed. 30 tablet 0   No current facility-administered medications on file prior to visit.    No Known Allergies  Past Medical History:  Diagnosis Date   Basal cell carcinoma 06/11/2018   L zygoma - excision 10/13/2018   BCC (basal cell carcinoma of skin) 06/11/2018   L cheek 5.0 cm ant to the earlobe   Compression fracture of fourth lumbar vertebra (HCC)    Squamous cell carcinoma of skin 04/02/2018   R distal lat calf - ED&C   Squamous cell carcinoma of skin 01/31/2022   L med bicep - ED&C    Past Surgical History:  Procedure Laterality Date   SKIN CANCER EXCISION     several    Family History  Problem Relation Age of Onset   Cancer Mother    Heart  disease Neg Hx    Diabetes Neg Hx     Social History   Socioeconomic History   Marital status: Divorced    Spouse name: Not on file   Number of children: 0   Years of education: Not on file   Highest education level: Not on file  Occupational History   Occupation: Customer service    Comment: Retired   Occupation: Chief Operating Officer    Comment: retired  Tobacco Use   Smoking status: Every Day    Packs/day: 0.50    Years: 63.00    Total pack years: 31.50    Types: Cigarettes    Passive exposure: Past   Smokeless tobacco: Never  Vaping Use   Vaping Use: Never used  Substance and Sexual Activity   Alcohol use: Not Currently   Drug use: Not on file   Sexual activity: Not on file  Other Topics Concern   Not on file  Social History Narrative   No living will      Would want neighbor [name] to make decisions   He does have cousins in [place]   Would accept resuscitation   May accept tube feeds   Social Determinants of Health   Financial Resource Strain: Not on file  Food Insecurity: Not on file  Transportation Needs: Not  on file  Physical Activity: Not on file  Stress: Not on file  Social Connections: Not on file  Intimate Partner Violence: Not on file   Review of Systems Sleeps okay--stays up later (goes to sleep 5-6AM and sleeps till noon or 1PM) Weight fairly stable--is up 3# since February visit     Objective:   Physical Exam Constitutional:      Appearance: Normal appearance.  Cardiovascular:     Rate and Rhythm: Normal rate and regular rhythm.     Heart sounds: No murmur heard.    No gallop.     Comments: Occ skips Pulmonary:     Effort: Pulmonary effort is normal.     Breath sounds: No wheezing or rales.     Comments: Decreased breath sounds but clear Musculoskeletal:     Comments: 1-2+ edema just in ankles  Neurological:     Mental Status: He is alert.  Psychiatric:        Mood and Affect: Mood normal.        Behavior: Behavior normal.             Assessment & Plan:

## 2022-05-13 NOTE — Assessment & Plan Note (Signed)
If iron deficient---will set up with GI If persists, will send for hematology evaluation--especially if low platelets or WBC as well

## 2022-05-14 ENCOUNTER — Other Ambulatory Visit: Payer: Self-pay | Admitting: Internal Medicine

## 2022-05-14 ENCOUNTER — Telehealth: Payer: Self-pay | Admitting: Internal Medicine

## 2022-05-14 DIAGNOSIS — D649 Anemia, unspecified: Secondary | ICD-10-CM

## 2022-05-14 LAB — CBC WITH DIFFERENTIAL/PLATELET
Basophils Absolute: 0.1 10*3/uL (ref 0.0–0.1)
Basophils Relative: 0.9 % (ref 0.0–3.0)
Eosinophils Absolute: 0.1 10*3/uL (ref 0.0–0.7)
Eosinophils Relative: 0.8 % (ref 0.0–5.0)
HCT: 26.7 % — ABNORMAL LOW (ref 39.0–52.0)
Hemoglobin: 9 g/dL — ABNORMAL LOW (ref 13.0–17.0)
Lymphocytes Relative: 30.5 % (ref 12.0–46.0)
Lymphs Abs: 1.9 10*3/uL (ref 0.7–4.0)
MCHC: 33.7 g/dL (ref 30.0–36.0)
MCV: 102.4 fl — ABNORMAL HIGH (ref 78.0–100.0)
Monocytes Absolute: 0 10*3/uL — ABNORMAL LOW (ref 0.1–1.0)
Monocytes Relative: 0.3 % — ABNORMAL LOW (ref 3.0–12.0)
Neutro Abs: 4.3 10*3/uL (ref 1.4–7.7)
Neutrophils Relative %: 67.5 % (ref 43.0–77.0)
Platelets: 167 10*3/uL (ref 150.0–400.0)
RBC: 2.6 Mil/uL — ABNORMAL LOW (ref 4.22–5.81)
RDW: 28.6 % — ABNORMAL HIGH (ref 11.5–15.5)
WBC: 6.4 10*3/uL (ref 4.0–10.5)

## 2022-05-14 LAB — IBC + FERRITIN
Ferritin: 538.5 ng/mL — ABNORMAL HIGH (ref 22.0–322.0)
Iron: 172 ug/dL — ABNORMAL HIGH (ref 42–165)
Saturation Ratios: 74.5 % — ABNORMAL HIGH (ref 20.0–50.0)
TIBC: 231 ug/dL — ABNORMAL LOW (ref 250.0–450.0)
Transferrin: 165 mg/dL — ABNORMAL LOW (ref 212.0–360.0)

## 2022-05-14 LAB — RENAL FUNCTION PANEL
Albumin: 4.1 g/dL (ref 3.5–5.2)
BUN: 33 mg/dL — ABNORMAL HIGH (ref 6–23)
CO2: 30 mEq/L (ref 19–32)
Calcium: 9.1 mg/dL (ref 8.4–10.5)
Chloride: 104 mEq/L (ref 96–112)
Creatinine, Ser: 0.79 mg/dL (ref 0.40–1.50)
GFR: 83.2 mL/min (ref >60.00)
Glucose, Bld: 86 mg/dL (ref 70–99)
Phosphorus: 3 mg/dL (ref 2.3–4.6)
Potassium: 4.2 mEq/L (ref 3.5–5.1)
Sodium: 140 mEq/L (ref 135–145)

## 2022-05-14 LAB — HEPATIC FUNCTION PANEL
ALT: 12 U/L (ref 0–53)
AST: 14 U/L (ref 0–37)
Albumin: 4.1 g/dL (ref 3.5–5.2)
Alkaline Phosphatase: 66 U/L (ref 39–117)
Bilirubin, Direct: 0.2 mg/dL (ref 0.0–0.3)
Total Bilirubin: 0.8 mg/dL (ref 0.2–1.2)
Total Protein: 6.2 g/dL (ref 6.0–8.3)

## 2022-05-14 LAB — B12 AND FOLATE PANEL
Folate: 16.4 ng/mL (ref >5.9)
Vitamin B-12: >1504 pg/mL — ABNORMAL HIGH (ref 211–911)

## 2022-05-14 NOTE — Telephone Encounter (Signed)
Patient called and was returning a call. Call back number [phone].

## 2022-05-14 NOTE — Telephone Encounter (Signed)
Tried to call pt but it goes straight to VM. I did not leave another message.

## 2022-05-16 NOTE — Telephone Encounter (Signed)
Left message for pt to call office

## 2022-05-16 NOTE — Telephone Encounter (Signed)
Spoke to pt. He will stop the iron. Has appt 06-06-22 with hematology.

## 2022-05-23 ENCOUNTER — Other Ambulatory Visit: Payer: Medicare Other

## 2022-05-23 ENCOUNTER — Ambulatory Visit: Payer: Medicare Other | Admitting: Oncology

## 2022-05-30 NOTE — Progress Notes (Signed)
[name]  [phone] [phone]) [phone]  ID: Stephen Bennett OB: [birth year]  MR#: [id number]  JSR#:[id number]  Patient Care Team: Venia Carbon, MD as PCP - General (Internal Medicine)  CHIEF COMPLAINT: Anemia, unspecified.  INTERVAL HISTORY: Patient returns to clinic today for repeat laboratory work and further evaluation.  He currently feels well and is asymptomatic.  He denies any weakness or fatigue.  He has no neurologic complaints.  He denies any recent fevers or illnesses.  He has a good appetite and denies weight loss.  He has no chest pain, shortness of breath, cough, or hemoptysis.  He denies any nausea, vomiting, constipation, or diarrhea.  He has no melena or hematochezia.  He has no urinary complaints.  Patient offers no specific complaints today.  REVIEW OF SYSTEMS:   Review of Systems  Constitutional: Negative.  Negative for fever, malaise/fatigue and weight loss.  Respiratory: Negative.  Negative for cough and shortness of breath.   Cardiovascular: Negative.  Negative for chest pain and leg swelling.  Gastrointestinal: Negative.  Negative for abdominal pain, blood in stool and melena.  Genitourinary: Negative.  Negative for hematuria.  Musculoskeletal: Negative.  Negative for back pain.  Skin: Negative.  Negative for rash.  Neurological: Negative.  Negative for dizziness, focal weakness, weakness and headaches.  Psychiatric/Behavioral: Negative.  The patient is not nervous/anxious.     As per HPI. Otherwise, a complete review of systems is negative.  PAST MEDICAL HISTORY: Past Medical History:  Diagnosis Date   Basal cell carcinoma 06/11/2018   L zygoma - excision 10/13/2018   BCC (basal cell carcinoma of skin) 06/11/2018   L cheek 5.0 cm ant to the earlobe   Compression fracture of fourth lumbar vertebra (HCC)    Squamous cell carcinoma of skin 04/02/2018   R distal lat calf - ED&C   Squamous cell carcinoma of skin 01/31/2022    L med bicep - ED&C    PAST SURGICAL HISTORY: Past Surgical History:  Procedure Laterality Date   SKIN CANCER EXCISION     several    FAMILY HISTORY: Family History  Problem Relation Age of Onset   Cancer Mother    Heart disease Neg Hx    Diabetes Neg Hx     ADVANCED DIRECTIVES (Y/N):  N  HEALTH MAINTENANCE: Social History   Tobacco Use   Smoking status: Every Day    Packs/day: 0.50    Years: 63.00    Total pack years: 31.50    Types: Cigarettes    Passive exposure: Past   Smokeless tobacco: Never  Vaping Use   Vaping Use: Never used  Substance Use Topics   Alcohol use: Not Currently     Colonoscopy:  PAP:  Bone density:  Lipid panel:  No Known Allergies  Current Outpatient Medications  Medication Sig Dispense Refill   calcium carbonate (TUMS - DOSED IN MG ELEMENTAL CALCIUM) 500 MG chewable tablet Chew 2-3 tablets by mouth daily.     cholecalciferol (VITAMIN D) 1000 units tablet Take 1,000 Units by mouth daily.     furosemide (LASIX) 20 MG tablet TAKE 1 TABLET (20 MG TOTAL) BY MOUTH DAILY AS NEEDED. FOR INCREASED MORNING LEG SWELLING 90 tablet 3   mupirocin ointment (BACTROBAN) 2 % Apply to bumps on the scalp QD until healed. 22 g 1   nicotine (NICOTROL) 10 MG inhaler Inhale 1 continuous puffing into the lungs as needed for smoking cessation.     traMADol (ULTRAM) 50 MG tablet Take  1 tablet (50 mg total) by mouth 3 (three) times daily as needed. 30 tablet 0   No current facility-administered medications for this visit.    OBJECTIVE: Vitals:   06/06/22 1427  BP: 129/64  Pulse: (!) 55  Resp: 19  Temp: 98.7 F (37.1 C)  SpO2: 98%     Body mass index is 20.75 kg/m.    ECOG FS:0 - Asymptomatic   LAB RESULTS:  Lab Results  Component Value Date   NA 140 05/13/2022   K 4.2 05/13/2022   CL 104 05/13/2022   CO2 30 05/13/2022   GLUCOSE 86 05/13/2022   BUN 33 (H) 05/13/2022   CREATININE 0.79 05/13/2022   CALCIUM 9.1 05/13/2022   PROT 6.2  05/13/2022   ALBUMIN 4.1 05/13/2022   ALBUMIN 4.1 05/13/2022   AST 14 05/13/2022   ALT 12 05/13/2022   ALKPHOS 66 05/13/2022   BILITOT 0.8 05/13/2022    Lab Results  Component Value Date   WBC 6.3 06/06/2022   NEUTROABS 3.2 06/06/2022   HGB 8.7 (L) 06/06/2022   HCT 26.6 (L) 06/06/2022   MCV 103.5 (H) 06/06/2022   PLT 219 06/06/2022   Lab Results  Component Value Date   IRON 96 06/06/2022   TIBC 196 (L) 06/06/2022   IRONPCTSAT 49 (H) 06/06/2022   Lab Results  Component Value Date   FERRITIN 657 (H) 06/06/2022     STUDIES: No results found.  ASSESSMENT: Anemia, unspecified.  PLAN:    1.  Anemia unspecified: Patient's hemoglobin remains decreased, but essentially unchanged at 8.7.  His iron panel essentially continues to be within normal limits.  All of his other laboratory work was also either negative or within normal limits.  Given his macrocytosis and elevated ferritin, suspect patient has underlying MDS.  This would take a bone marrow biopsy to diagnose which is not necessary at this point.  Will order [employer] myeloid panel with next lab draw.  No intervention is needed at this time.  Patient agreed that if his hemoglobin continues to decline or he became symptomatic, would pursue biopsy at that point.  Return to clinic in 4 months with repeat laboratory work and further evaluation. 2.  Macrocytosis: B12 and folate are within normal limits. 3.  Elevated ferritin: Likely secondary to acute phase reactant.  Monitor.   Patient expressed understanding and was in agreement with this plan. He also understands that He can call clinic at any time with any questions, concerns, or complaints.     Lloyd Huger, MD   06/07/2022 6:25 AM

## 2022-06-06 ENCOUNTER — Encounter: Payer: Self-pay | Admitting: Oncology

## 2022-06-06 ENCOUNTER — Inpatient Hospital Stay: Payer: Medicare Other | Attending: Oncology | Admitting: Oncology

## 2022-06-06 ENCOUNTER — Other Ambulatory Visit: Payer: Self-pay

## 2022-06-06 ENCOUNTER — Inpatient Hospital Stay: Payer: Medicare Other

## 2022-06-06 VITALS — BP 129/64 | HR 55 | Temp 98.7°F | Resp 19 | Wt 130.5 lb

## 2022-06-06 DIAGNOSIS — D649 Anemia, unspecified: Secondary | ICD-10-CM | POA: Diagnosis not present

## 2022-06-06 DIAGNOSIS — F17210 Nicotine dependence, cigarettes, uncomplicated: Secondary | ICD-10-CM | POA: Diagnosis not present

## 2022-06-06 DIAGNOSIS — R7989 Other specified abnormal findings of blood chemistry: Secondary | ICD-10-CM | POA: Insufficient documentation

## 2022-06-06 DIAGNOSIS — Z85828 Personal history of other malignant neoplasm of skin: Secondary | ICD-10-CM | POA: Diagnosis not present

## 2022-06-06 DIAGNOSIS — D508 Other iron deficiency anemias: Secondary | ICD-10-CM

## 2022-06-06 LAB — CBC WITH DIFFERENTIAL/PLATELET
Abs Immature Granulocytes: 0.02 10*3/uL (ref 0.00–0.07)
Basophils Absolute: 0.1 10*3/uL (ref 0.0–0.1)
Basophils Relative: 1 %
Eosinophils Absolute: 0.1 10*3/uL (ref 0.0–0.5)
Eosinophils Relative: 2 %
HCT: 26.6 % — ABNORMAL LOW (ref 39.0–52.0)
Hemoglobin: 8.7 g/dL — ABNORMAL LOW (ref 13.0–17.0)
Immature Granulocytes: 0 %
Lymphocytes Relative: 36 %
Lymphs Abs: 2.3 10*3/uL (ref 0.7–4.0)
MCH: 33.9 pg (ref 26.0–34.0)
MCHC: 32.7 g/dL (ref 30.0–36.0)
MCV: 103.5 fL — ABNORMAL HIGH (ref 80.0–100.0)
Monocytes Absolute: 0.7 10*3/uL (ref 0.1–1.0)
Monocytes Relative: 11 %
Neutro Abs: 3.2 10*3/uL (ref 1.7–7.7)
Neutrophils Relative %: 50 %
Platelets: 219 10*3/uL (ref 150–400)
RBC: 2.57 MIL/uL — ABNORMAL LOW (ref 4.22–5.81)
RDW: 26.9 % — ABNORMAL HIGH (ref 11.5–15.5)
WBC: 6.3 10*3/uL (ref 4.0–10.5)
nRBC: 0.3 % — ABNORMAL HIGH (ref 0.0–0.2)

## 2022-06-06 LAB — IRON AND TIBC
Iron: 96 ug/dL (ref 45–182)
Saturation Ratios: 49 % — ABNORMAL HIGH (ref 17.9–39.5)
TIBC: 196 ug/dL — ABNORMAL LOW (ref 250–450)
UIBC: 100 ug/dL

## 2022-06-06 LAB — FERRITIN: Ferritin: 657 ng/mL — ABNORMAL HIGH (ref 24–336)

## 2022-06-12 ENCOUNTER — Ambulatory Visit: Payer: Medicare Other | Admitting: Dermatology

## 2022-06-12 DIAGNOSIS — D492 Neoplasm of unspecified behavior of bone, soft tissue, and skin: Secondary | ICD-10-CM

## 2022-06-12 DIAGNOSIS — C44329 Squamous cell carcinoma of skin of other parts of face: Secondary | ICD-10-CM

## 2022-06-12 DIAGNOSIS — C44320 Squamous cell carcinoma of skin of unspecified parts of face: Secondary | ICD-10-CM

## 2022-06-12 NOTE — Progress Notes (Signed)
   Follow-Up Visit   Subjective  Stephen Bennett. is a 81 y.o. male who presents for the following: check spot (R temple, has had areas txted with LN2 in this area in past, pt picks at it). The patient has spots, moles and lesions to be evaluated, some may be new or changing and the patient has concerns that these could be cancer.  The following portions of the chart were reviewed this encounter and updated as appropriate:   Tobacco  Allergies  Meds  Problems  Med Hx  Surg Hx  Fam Hx     Review of Systems:  No other skin or systemic complaints except as noted in HPI or Assessment and Plan.  Objective  Well appearing patient in no apparent distress; mood and affect are within normal limits.  A focused examination was performed including face. Relevant physical exam findings are noted in the Assessment and Plan.  R temple Crusted pap 0.8cm      Assessment & Plan  Neoplasm of skin R temple  Epidermal / dermal shaving  Lesion diameter (cm):  0.8 Informed consent: discussed and consent obtained   Timeout: patient name, date of birth, surgical site, and procedure verified   Procedure prep:  Patient was prepped and draped in usual sterile fashion Prep type:  Isopropyl alcohol Anesthesia: the lesion was anesthetized in a standard fashion   Anesthetic:  1% lidocaine w/ epinephrine 1-100,000 buffered w/ 8.4% NaHCO3 Instrument used: flexible razor blade   Hemostasis achieved with: pressure, aluminum chloride and electrodesiccation   Outcome: patient tolerated procedure well   Post-procedure details: sterile dressing applied and wound care instructions given   Dressing type: bandage and petrolatum    Destruction of lesion Complexity: extensive   Destruction method: electrodesiccation and curettage   Informed consent: discussed and consent obtained   Timeout:  patient name, date of birth, surgical site, and procedure verified Procedure prep:  Patient was prepped and  draped in usual sterile fashion Prep type:  Isopropyl alcohol Anesthesia: the lesion was anesthetized in a standard fashion   Anesthetic:  1% lidocaine w/ epinephrine 1-100,000 buffered w/ 8.4% NaHCO3 Curettage performed in three different directions: Yes   Electrodesiccation performed over the curetted area: Yes   Lesion length (cm):  0.8 Lesion width (cm):  0.8 Margin per side (cm):  0.2 Final wound size (cm):  1.2 Hemostasis achieved with:  pressure, aluminum chloride and electrodesiccation Outcome: patient tolerated procedure well with no complications   Post-procedure details: sterile dressing applied and wound care instructions given   Dressing type: bandage and petrolatum    Specimen 1 - Surgical pathology Differential Diagnosis: D48.5 R/O SCC  Check Margins: No Crusted pap 0.8cm EDC today   Return for as scheduled for 73mf/u for aks.  I, SOthelia Pulling RMA, am acting as scribe for DSarina Ser MD . Documentation: I have reviewed the above documentation for accuracy and completeness, and I agree with the above.  DSarina Ser MD

## 2022-06-12 NOTE — Patient Instructions (Addendum)
Wound Care Instructions  Cleanse wound gently with soap and water once a day then pat dry with clean gauze. Apply a thin coat of Petrolatum (petroleum jelly, "Vaseline") over the wound (unless you have an allergy to this). We recommend that you use a new, sterile tube of Vaseline. Do not pick or remove scabs. Do not remove the yellow or white "healing tissue" from the base of the wound.  Cover the wound with fresh, clean, nonstick gauze and secure with paper tape. You may use Band-Aids in place of gauze and tape if the wound is small enough, but would recommend trimming much of the tape off as there is often too much. Sometimes Band-Aids can irritate the skin.  You should call the office for your biopsy report after 1 week if you have not already been contacted.  If you experience any problems, such as abnormal amounts of bleeding, swelling, significant bruising, significant pain, or evidence of infection, please call the office immediately.  FOR ADULT SURGERY PATIENTS: If you need something for pain relief you may take 1 extra strength Tylenol (acetaminophen) AND 2 Ibuprofen (200mg each) together every 4 hours as needed for pain. (do not take these if you are allergic to them or if you have a reason you should not take them.) Typically, you may only need pain medication for 1 to 3 days.     Due to recent changes in healthcare laws, you may see results of your pathology and/or laboratory studies on MyChart before the doctors have had a chance to review them. We understand that in some cases there may be results that are confusing or concerning to you. Please understand that not all results are received at the same time and often the doctors may need to interpret multiple results in order to provide you with the best plan of care or course of treatment. Therefore, we ask that you please give us 2 business days to thoroughly review all your results before contacting the office for clarification. Should  we see a critical lab result, you will be contacted sooner.   If You Need Anything After Your Visit  If you have any questions or concerns for your doctor, please call our main line at [phone] and press option 4 to reach your doctor's medical assistant. If no one answers, please leave a voicemail as directed and we will return your call as soon as possible. Messages left after 4 pm will be answered the following business day.   You may also send us a message via MyChart. We typically respond to MyChart messages within 1-2 business days.  For prescription refills, please ask your pharmacy to contact our office. Our fax number is [phone].  If you have an urgent issue when the clinic is closed that cannot wait until the next business day, you can page your doctor at the number below.    Please note that while we do our best to be available for urgent issues outside of office hours, we are not available 24/7.   If you have an urgent issue and are unable to reach us, you may choose to seek medical care at your doctor's office, retail clinic, urgent care center, or emergency room.  If you have a medical emergency, please immediately call 911 or go to the emergency department.  Pager Numbers  - Dr. Kowalski: [phone]  - Dr. Moye: [phone]  - Dr. Stewart: [phone]  In the event of inclement weather, please call our main line at   [phone] for an update on the status of any delays or closures.  Dermatology Medication Tips: Please keep the boxes that topical medications come in in order to help keep track of the instructions about where and how to use these. Pharmacies typically print the medication instructions only on the boxes and not directly on the medication tubes.   If your medication is too expensive, please contact our office at [phone] option 4 or send us a message through MyChart.   We are unable to tell what your co-pay for medications will be in  advance as this is different depending on your insurance coverage. However, we may be able to find a substitute medication at lower cost or fill out paperwork to get insurance to cover a needed medication.   If a prior authorization is required to get your medication covered by your insurance company, please allow us 1-2 business days to complete this process.  Drug prices often vary depending on where the prescription is filled and some pharmacies may offer cheaper prices.  The website www.goodrx.com contains coupons for medications through different pharmacies. The prices here do not account for what the cost may be with help from insurance (it may be cheaper with your insurance), but the website can give you the price if you did not use any insurance.  - You can print the associated coupon and take it with your prescription to the pharmacy.  - You may also stop by our office during regular business hours and pick up a GoodRx coupon card.  - If you need your prescription sent electronically to a different pharmacy, notify our office through [place] MyChart or by phone at [phone] option 4.     Si Usted Necesita Algo Despus de Su Visita  Tambin puede enviarnos un mensaje a travs de MyChart. Por lo general respondemos a los mensajes de MyChart en el transcurso de 1 a 2 das hbiles.  Para renovar recetas, por favor pida a su farmacia que se ponga en contacto con nuestra oficina. Nuestro nmero de fax es el [phone].  Si tiene un asunto urgente cuando la clnica est cerrada y que no puede esperar hasta el siguiente da hbil, puede llamar/localizar a su doctor(a) al nmero que aparece a continuacin.   Por favor, tenga en cuenta que aunque hacemos todo lo posible para estar disponibles para asuntos urgentes fuera del horario de oficina, no estamos disponibles las 24 horas del da, los 7 das de la semana.   Si tiene un problema urgente y no puede comunicarse con nosotros, puede  optar por buscar atencin mdica  en el consultorio de su doctor(a), en una clnica privada, en un centro de atencin urgente o en una sala de emergencias.  Si tiene una emergencia mdica, por favor llame inmediatamente al 911 o vaya a la sala de emergencias.  Nmeros de bper  - Dr. Kowalski: [phone]  - Dra. Moye: [phone]  - Dra. Stewart: [phone]  En caso de inclemencias del tiempo, por favor llame a nuestra lnea principal al [phone] para una actualizacin sobre el estado de cualquier retraso o cierre.  Consejos para la medicacin en dermatologa: Por favor, guarde las cajas en las que vienen los medicamentos de uso tpico para ayudarle a seguir las instrucciones sobre dnde y cmo usarlos. Las farmacias generalmente imprimen las instrucciones del medicamento slo en las cajas y no directamente en los tubos del medicamento.   Si su medicamento es muy caro, por favor, pngase en contacto con   nuestra oficina llamando al [phone] y presione la opcin 4 o envenos un mensaje a travs de MyChart.   No podemos decirle cul ser su copago por los medicamentos por adelantado ya que esto es diferente dependiendo de la cobertura de su seguro. Sin embargo, es posible que podamos encontrar un medicamento sustituto a menor costo o llenar un formulario para que el seguro cubra el medicamento que se considera necesario.   Si se requiere una autorizacin previa para que su compaa de seguros cubra su medicamento, por favor permtanos de 1 a 2 das hbiles para completar este proceso.  Los precios de los medicamentos varan con frecuencia dependiendo del lugar de dnde se surte la receta y alguna farmacias pueden ofrecer precios ms baratos.  El sitio web www.goodrx.com tiene cupones para medicamentos de diferentes farmacias. Los precios aqu no tienen en cuenta lo que podra costar con la ayuda del seguro (puede ser ms barato con su seguro), pero el sitio web puede darle el  precio si no utiliz ningn seguro.  - Puede imprimir el cupn correspondiente y llevarlo con su receta a la farmacia.  - Tambin puede pasar por nuestra oficina durante el horario de atencin regular y recoger una tarjeta de cupones de GoodRx.  - Si necesita que su receta se enve electrnicamente a una farmacia diferente, informe a nuestra oficina a travs de MyChart de [place] o por telfono llamando al [phone] y presione la opcin 4.  

## 2022-06-17 ENCOUNTER — Telehealth: Payer: Self-pay

## 2022-06-17 NOTE — Telephone Encounter (Signed)
-----   Message from Ralene Bathe, MD sent at 06/17/2022 11:03 AM EDT ----- Diagnosis Skin , right temple WELL DIFFERENTIATED SQUAMOUS CELL CARCINOMA  Cancer - SCC Already treated Recheck next visit

## 2022-06-17 NOTE — Telephone Encounter (Signed)
Patient informed of pathology results 

## 2022-06-18 ENCOUNTER — Encounter: Payer: Self-pay | Admitting: Dermatology

## 2022-06-25 ENCOUNTER — Other Ambulatory Visit: Payer: Self-pay | Admitting: Internal Medicine

## 2022-06-25 MED ORDER — TRAMADOL HCL 50 MG PO TABS
50.0000 mg | ORAL_TABLET | Freq: Three times a day (TID) | ORAL | 0 refills | Status: DC | PRN
Start: 2022-06-25 — End: 2022-08-22

## 2022-06-25 NOTE — Telephone Encounter (Signed)
Last filled 05-03-22 #30 Last OV 05-13-22 No Future OV [facility] [address]

## 2022-06-25 NOTE — Telephone Encounter (Signed)
Caller Name: Delman Goshorn Call back phone #: [phone]  MEDICATION(S):  Tramadol  Days of Med Remaining: 3 left  Has the patient contacted their pharmacy (YES/NO)? No, controlled What did pharmacy advise?   [facility]  ~~~Please advise patient/caregiver to allow 2-3 business days to process RX refills.

## 2022-08-15 ENCOUNTER — Ambulatory Visit: Payer: Medicare Other | Admitting: Dermatology

## 2022-08-15 DIAGNOSIS — L821 Other seborrheic keratosis: Secondary | ICD-10-CM | POA: Diagnosis not present

## 2022-08-15 DIAGNOSIS — L820 Inflamed seborrheic keratosis: Secondary | ICD-10-CM

## 2022-08-15 DIAGNOSIS — L578 Other skin changes due to chronic exposure to nonionizing radiation: Secondary | ICD-10-CM

## 2022-08-15 DIAGNOSIS — L570 Actinic keratosis: Secondary | ICD-10-CM

## 2022-08-15 DIAGNOSIS — D692 Other nonthrombocytopenic purpura: Secondary | ICD-10-CM | POA: Diagnosis not present

## 2022-08-15 NOTE — Progress Notes (Signed)
Follow-Up Visit   Subjective  Stephen Bennett. is a 81 y.o. male who presents for the following: Follow-up (6 month follow up on aks. Patient would like back of head checked. Patient reports spot at mid forehead and small bumps at legs and arms. ).  The following portions of the chart were reviewed this encounter and updated as appropriate:  Tobacco  Allergies  Meds  Problems  Med Hx  Surg Hx  Fam Hx     Review of Systems: No other skin or systemic complaints except as noted in HPI or Assessment and Plan.  Objective  Well appearing patient in no apparent distress; mood and affect are within normal limits.  A focused examination was performed including head, including the scalp, face, neck, nose, ears, eyelids, and lips.  Arms and legs also examined.  Relevant physical exam findings are noted in the Assessment and Plan.  face x 17 (17) Erythematous thin papules/macules with gritty scale.   right nose x 1, glabella x 1, scalp x 1, right calf x 3, right arm x 3 (9) Erythematous stuck-on, waxy papule or plaque   Assessment & Plan  Actinic keratosis (17) face x 17  Actinic keratoses are precancerous spots that appear secondary to cumulative UV radiation exposure/sun exposure over time. They are chronic with expected duration over 1 year. A portion of actinic keratoses will progress to squamous cell carcinoma of the skin. It is not possible to reliably predict which spots will progress to skin cancer and so treatment is recommended to prevent development of skin cancer.  Recommend daily broad spectrum sunscreen SPF 30+ to sun-exposed areas, reapply every 2 hours as needed.  Recommend staying in the shade or wearing long sleeves, sun glasses (UVA+UVB protection) and wide brim hats (4-inch brim around the entire circumference of the hat). Call for new or changing lesions.  Destruction of lesion - face x 17 Complexity: simple   Destruction method: cryotherapy   Informed  consent: discussed and consent obtained   Timeout:  patient name, date of birth, surgical site, and procedure verified Lesion destroyed using liquid nitrogen: Yes   Region frozen until ice ball extended beyond lesion: Yes   Outcome: patient tolerated procedure well with no complications   Post-procedure details: wound care instructions given   Additional details:  Prior to procedure, discussed risks of blister formation, small wound, skin dyspigmentation, or rare scar following cryotherapy. Recommend Vaseline ointment to treated areas while healing.   Inflamed seborrheic keratosis (9) right nose x 1, glabella x 1, scalp x 1, right calf x 3, right arm x 3  Symptomatic, irritating, patient would like treated.  Destruction of lesion - right nose x 1, glabella x 1, scalp x 1, right calf x 3, right arm x 3 Complexity: simple   Destruction method: cryotherapy   Informed consent: discussed and consent obtained   Timeout:  patient name, date of birth, surgical site, and procedure verified Lesion destroyed using liquid nitrogen: Yes   Region frozen until ice ball extended beyond lesion: Yes   Outcome: patient tolerated procedure well with no complications   Post-procedure details: wound care instructions given   Additional details:  Prior to procedure, discussed risks of blister formation, small wound, skin dyspigmentation, or rare scar following cryotherapy. Recommend Vaseline ointment to treated areas while healing.   Seborrheic Keratoses - Stuck-on, waxy, tan-brown papules and/or plaques  - Benign-appearing - Discussed benign etiology and prognosis. - Observe - Call for any changes  Purpura - Chronic; persistent and recurrent.  Treatable, but not curable. - Violaceous macules and patches - Benign - Related to trauma, age, sun damage and/or use of blood thinners, chronic use of topical and/or oral steroids - Observe - Can use OTC arnica containing moisturizer such as Dermend Bruise  Formula if desired - Call for worsening or other concerns  Actinic Damage - chronic, secondary to cumulative UV radiation exposure/sun exposure over time - diffuse scaly erythematous macules with underlying dyspigmentation - Recommend daily broad spectrum sunscreen SPF 30+ to sun-exposed areas, reapply every 2 hours as needed.  - Recommend staying in the shade or wearing long sleeves, sun glasses (UVA+UVB protection) and wide brim hats (4-inch brim around the entire circumference of the hat). - Call for new or changing lesions.  Return in about 6 months (around 02/14/2023) for ak followup. IRuthell Rummage, CMA, am acting as scribe for Sarina Ser, MD. Documentation: I have reviewed the above documentation for accuracy and completeness, and I agree with the above.  Sarina Ser, MD

## 2022-08-15 NOTE — Patient Instructions (Addendum)
Actinic keratoses are precancerous spots that appear secondary to cumulative UV radiation exposure/sun exposure over time. They are chronic with expected duration over 1 year. A portion of actinic keratoses will progress to squamous cell carcinoma of the skin. It is not possible to reliably predict which spots will progress to skin cancer and so treatment is recommended to prevent development of skin cancer.  Recommend daily broad spectrum sunscreen SPF 30+ to sun-exposed areas, reapply every 2 hours as needed.  Recommend staying in the shade or wearing long sleeves, sun glasses (UVA+UVB protection) and wide brim hats (4-inch brim around the entire circumference of the hat). Call for new or changing lesions.   Cryotherapy Aftercare  Wash gently with soap and water everyday.   Apply Vaseline and Band-Aid daily until healed.   Seborrheic Keratosis  What causes seborrheic keratoses? Seborrheic keratoses are harmless, common skin growths that first appear during adult life.  As time goes by, more growths appear.  Some people may develop a large number of them.  Seborrheic keratoses appear on both covered and uncovered body parts.  They are not caused by sunlight.  The tendency to develop seborrheic keratoses can be inherited.  They vary in color from skin-colored to gray, brown, or even black.  They can be either smooth or have a rough, warty surface.   Seborrheic keratoses are superficial and look as if they were stuck on the skin.  Under the microscope this type of keratosis looks like layers upon layers of skin.  That is why at times the top layer may seem to fall off, but the rest of the growth remains and re-grows.    Treatment Seborrheic keratoses do not need to be treated, but can easily be removed in the office.  Seborrheic keratoses often cause symptoms when they rub on clothing or jewelry.  Lesions can be in the way of shaving.  If they become inflamed, they can cause itching, soreness, or  burning.  Removal of a seborrheic keratosis can be accomplished by freezing, burning, or surgery. If any spot bleeds, scabs, or grows rapidly, please return to have it checked, as these can be an indication of a skin cancer.          Due to recent changes in healthcare laws, you may see results of your pathology and/or laboratory studies on MyChart before the doctors have had a chance to review them. We understand that in some cases there may be results that are confusing or concerning to you. Please understand that not all results are received at the same time and often the doctors may need to interpret multiple results in order to provide you with the best plan of care or course of treatment. Therefore, we ask that you please give us 2 business days to thoroughly review all your results before contacting the office for clarification. Should we see a critical lab result, you will be contacted sooner.   If You Need Anything After Your Visit  If you have any questions or concerns for your doctor, please call our main line at [phone] and press option 4 to reach your doctor's medical assistant. If no one answers, please leave a voicemail as directed and we will return your call as soon as possible. Messages left after 4 pm will be answered the following business day.   You may also send us a message via MyChart. We typically respond to MyChart messages within 1-2 business days.  For prescription refills, please ask your pharmacy   to contact our office. Our fax number is [phone].  If you have an urgent issue when the clinic is closed that cannot wait until the next business day, you can page your doctor at the number below.    Please note that while we do our best to be available for urgent issues outside of office hours, we are not available 24/7.   If you have an urgent issue and are unable to reach us, you may choose to seek medical care at your doctor's office, retail clinic,  urgent care center, or emergency room.  If you have a medical emergency, please immediately call 911 or go to the emergency department.  Pager Numbers  - Dr. Kowalski: [phone]  - Dr. Moye: [phone]  - Dr. Stewart: [phone]  In the event of inclement weather, please call our main line at [phone] for an update on the status of any delays or closures.  Dermatology Medication Tips: Please keep the boxes that topical medications come in in order to help keep track of the instructions about where and how to use these. Pharmacies typically print the medication instructions only on the boxes and not directly on the medication tubes.   If your medication is too expensive, please contact our office at [phone] option 4 or send us a message through MyChart.   We are unable to tell what your co-pay for medications will be in advance as this is different depending on your insurance coverage. However, we may be able to find a substitute medication at lower cost or fill out paperwork to get insurance to cover a needed medication.   If a prior authorization is required to get your medication covered by your insurance company, please allow us 1-2 business days to complete this process.  Drug prices often vary depending on where the prescription is filled and some pharmacies may offer cheaper prices.  The website www.goodrx.com contains coupons for medications through different pharmacies. The prices here do not account for what the cost may be with help from insurance (it may be cheaper with your insurance), but the website can give you the price if you did not use any insurance.  - You can print the associated coupon and take it with your prescription to the pharmacy.  - You may also stop by our office during regular business hours and pick up a GoodRx coupon card.  - If you need your prescription sent electronically to a different pharmacy, notify our office through [place]  MyChart or by phone at [phone] option 4.     Si Usted Necesita Algo Despus de Su Visita  Tambin puede enviarnos un mensaje a travs de MyChart. Por lo general respondemos a los mensajes de MyChart en el transcurso de 1 a 2 das hbiles.  Para renovar recetas, por favor pida a su farmacia que se ponga en contacto con nuestra oficina. Nuestro nmero de fax es el [phone].  Si tiene un asunto urgente cuando la clnica est cerrada y que no puede esperar hasta el siguiente da hbil, puede llamar/localizar a su doctor(a) al nmero que aparece a continuacin.   Por favor, tenga en cuenta que aunque hacemos todo lo posible para estar disponibles para asuntos urgentes fuera del horario de oficina, no estamos disponibles las 24 horas del da, los 7 das de la semana.   Si tiene un problema urgente y no puede comunicarse con nosotros, puede optar por buscar atencin mdica  en el consultorio de su doctor(a), en una   clnica privada, en un centro de atencin urgente o en una sala de emergencias.  Si tiene una emergencia mdica, por favor llame inmediatamente al 911 o vaya a la sala de emergencias.  Nmeros de bper  - Dr. Kowalski: [phone]  - Dra. Moye: [phone]  - Dra. Stewart: [phone]  En caso de inclemencias del tiempo, por favor llame a nuestra lnea principal al [phone] para una actualizacin sobre el estado de cualquier retraso o cierre.  Consejos para la medicacin en dermatologa: Por favor, guarde las cajas en las que vienen los medicamentos de uso tpico para ayudarle a seguir las instrucciones sobre dnde y cmo usarlos. Las farmacias generalmente imprimen las instrucciones del medicamento slo en las cajas y no directamente en los tubos del medicamento.   Si su medicamento es muy caro, por favor, pngase en contacto con nuestra oficina llamando al [phone] y presione la opcin 4 o envenos un mensaje a travs de MyChart.   No podemos decirle cul  ser su copago por los medicamentos por adelantado ya que esto es diferente dependiendo de la cobertura de su seguro. Sin embargo, es posible que podamos encontrar un medicamento sustituto a menor costo o llenar un formulario para que el seguro cubra el medicamento que se considera necesario.   Si se requiere una autorizacin previa para que su compaa de seguros cubra su medicamento, por favor permtanos de 1 a 2 das hbiles para completar este proceso.  Los precios de los medicamentos varan con frecuencia dependiendo del lugar de dnde se surte la receta y alguna farmacias pueden ofrecer precios ms baratos.  El sitio web www.goodrx.com tiene cupones para medicamentos de diferentes farmacias. Los precios aqu no tienen en cuenta lo que podra costar con la ayuda del seguro (puede ser ms barato con su seguro), pero el sitio web puede darle el precio si no utiliz ningn seguro.  - Puede imprimir el cupn correspondiente y llevarlo con su receta a la farmacia.  - Tambin puede pasar por nuestra oficina durante el horario de atencin regular y recoger una tarjeta de cupones de GoodRx.  - Si necesita que su receta se enve electrnicamente a una farmacia diferente, informe a nuestra oficina a travs de MyChart de [place] o por telfono llamando al [phone] y presione la opcin 4.  

## 2022-08-22 ENCOUNTER — Telehealth: Payer: Self-pay | Admitting: Internal Medicine

## 2022-08-22 ENCOUNTER — Other Ambulatory Visit: Payer: Self-pay | Admitting: Internal Medicine

## 2022-08-22 MED ORDER — FUROSEMIDE 20 MG PO TABS: 20.0000 mg | ORAL_TABLET | Freq: Every day | ORAL | 3 refills | Status: DC | PRN

## 2022-08-22 MED ORDER — TRAMADOL HCL 50 MG PO TABS: 50.0000 mg | ORAL_TABLET | Freq: Three times a day (TID) | ORAL | 0 refills | Status: DC | PRN

## 2022-08-22 NOTE — Telephone Encounter (Signed)
I thought I mailed it to him. I don't have it documented. But, he had repeat labs in July at his [facility]. Now seeing hematology.

## 2022-08-22 NOTE — Telephone Encounter (Signed)
Spoke to pt. Relayed Dr Alla German message.

## 2022-08-22 NOTE — Telephone Encounter (Signed)
Patient called in today and asked about a fecal test that was suppose to be sent to him back in February. Hes just remembering that he never received it. He would like to know is it still relevant for him to complete that test,if so could it be resent to him?

## 2022-08-22 NOTE — Telephone Encounter (Signed)
  Encourage patient to contact the pharmacy for refills or they can request refills through [facility]  Did the patient contact the pharmacy:NO     LAST APPOINTMENT DATE:  05/13/22  NEXT APPOINTMENT DATE:n/a  MEDICATION:traMADol (ULTRAM) 50 MG tablet furosemide (LASIX) 20 MG tablet  Is the patient out of medication? NO  If not, how much is left? Have a couple left of each  Is this a 90 day supply: (90)(30)  [id number]  [phone] Fax:  [phone]     Let patient know to contact pharmacy at the end of the day to make sure medication is ready.  Please notify patient to allow 48-72 hours to process

## 2022-08-22 NOTE — Telephone Encounter (Signed)
Tramadol last filled 06-25-22 #30 Last OV 05-13-22 No Future OV [facility] [address]

## 2022-08-25 ENCOUNTER — Encounter: Payer: Self-pay | Admitting: Dermatology

## 2022-10-07 ENCOUNTER — Inpatient Hospital Stay: Payer: Medicare Other | Admitting: Nurse Practitioner

## 2022-10-07 ENCOUNTER — Other Ambulatory Visit: Payer: Self-pay

## 2022-10-07 ENCOUNTER — Ambulatory Visit: Payer: Medicare Other | Admitting: Nurse Practitioner

## 2022-10-07 ENCOUNTER — Other Ambulatory Visit: Payer: Medicare Other

## 2022-10-07 ENCOUNTER — Inpatient Hospital Stay: Payer: Medicare Other | Attending: Nurse Practitioner

## 2022-10-07 ENCOUNTER — Encounter: Payer: Self-pay | Admitting: Nurse Practitioner

## 2022-10-07 VITALS — BP 123/74 | HR 69 | Temp 97.4°F | Resp 20 | Wt 131.6 lb

## 2022-10-07 DIAGNOSIS — M25472 Effusion, left ankle: Secondary | ICD-10-CM | POA: Insufficient documentation

## 2022-10-07 DIAGNOSIS — F17210 Nicotine dependence, cigarettes, uncomplicated: Secondary | ICD-10-CM | POA: Insufficient documentation

## 2022-10-07 DIAGNOSIS — R7989 Other specified abnormal findings of blood chemistry: Secondary | ICD-10-CM | POA: Insufficient documentation

## 2022-10-07 DIAGNOSIS — D6489 Other specified anemias: Secondary | ICD-10-CM

## 2022-10-07 DIAGNOSIS — D649 Anemia, unspecified: Secondary | ICD-10-CM | POA: Insufficient documentation

## 2022-10-07 DIAGNOSIS — M25471 Effusion, right ankle: Secondary | ICD-10-CM | POA: Insufficient documentation

## 2022-10-07 DIAGNOSIS — Z85828 Personal history of other malignant neoplasm of skin: Secondary | ICD-10-CM | POA: Insufficient documentation

## 2022-10-07 DIAGNOSIS — D508 Other iron deficiency anemias: Secondary | ICD-10-CM

## 2022-10-07 LAB — CBC WITH DIFFERENTIAL/PLATELET
Abs Immature Granulocytes: 0.01 10*3/uL (ref 0.00–0.07)
Basophils Absolute: 0.1 10*3/uL (ref 0.0–0.1)
Basophils Relative: 1 %
Eosinophils Absolute: 0.2 10*3/uL (ref 0.0–0.5)
Eosinophils Relative: 3 %
HCT: 26.7 % — ABNORMAL LOW (ref 39.0–52.0)
Hemoglobin: 8.6 g/dL — ABNORMAL LOW (ref 13.0–17.0)
Immature Granulocytes: 0 %
Lymphocytes Relative: 26 %
Lymphs Abs: 1.9 10*3/uL (ref 0.7–4.0)
MCH: 33 pg (ref 26.0–34.0)
MCHC: 32.2 g/dL (ref 30.0–36.0)
MCV: 102.3 fL — ABNORMAL HIGH (ref 80.0–100.0)
Monocytes Absolute: 0.8 10*3/uL (ref 0.1–1.0)
Monocytes Relative: 10 %
Neutro Abs: 4.4 10*3/uL (ref 1.7–7.7)
Neutrophils Relative %: 60 %
Platelets: 211 10*3/uL (ref 150–400)
RBC: 2.61 MIL/uL — ABNORMAL LOW (ref 4.22–5.81)
RDW: 27.1 % — ABNORMAL HIGH (ref 11.5–15.5)
WBC: 7.3 10*3/uL (ref 4.0–10.5)
nRBC: 0.3 % — ABNORMAL HIGH (ref 0.0–0.2)

## 2022-10-07 LAB — IRON AND TIBC
Iron: 120 ug/dL (ref 45–182)
Saturation Ratios: 63 % — ABNORMAL HIGH (ref 17.9–39.5)
TIBC: 192 ug/dL — ABNORMAL LOW (ref 250–450)
UIBC: 72 ug/dL

## 2022-10-07 LAB — FERRITIN: Ferritin: 670 ng/mL — ABNORMAL HIGH (ref 24–336)

## 2022-10-07 NOTE — Progress Notes (Signed)
[place]  Telephone:([phone] [phone]  ID: Stephen Bennett. OB: Jan 22, 1941  MR#: [id number]  KGY#:[id number]  Patient Care Team: Venia Carbon, MD as PCP - General (Internal Medicine) Verlon Au, NP as Nurse Practitioner (Nurse Practitioner)  CHIEF COMPLAINT: Anemia, unspecified  INTERVAL HISTORY: Patient returns to clinic today follow up of anemia. He says he feels well. Could do 100 push ups. Remains active. Eats a balanced diet. No recent infections. No weakness or fatigue. Has chronic swelling of his ankles bilaterally which is unchanged. No other complaints.    REVIEW OF SYSTEMS:   Review of Systems  Constitutional:  Negative for fever, malaise/fatigue and weight loss.  Respiratory: Negative.  Negative for cough and shortness of breath.   Cardiovascular:  Positive for leg swelling. Negative for chest pain.  Gastrointestinal:  Negative for abdominal pain, blood in stool and melena.  Genitourinary:  Negative for hematuria.  Musculoskeletal:  Negative for back pain, falls and myalgias.  Skin:  Negative for rash.  Neurological:  Negative for dizziness, focal weakness, weakness and headaches.  Psychiatric/Behavioral:  Negative for depression. The patient is not nervous/anxious.   As per HPI. Otherwise, a complete review of systems is negative.  PAST MEDICAL HISTORY: Past Medical History:  Diagnosis Date   Basal cell carcinoma 06/11/2018   L zygoma - excision 10/13/2018   BCC (basal cell carcinoma of skin) 06/11/2018   L cheek 5.0 cm ant to the earlobe   Compression fracture of fourth lumbar vertebra (HCC)    Squamous cell carcinoma of skin 04/02/2018   R distal lat calf - ED&C   Squamous cell carcinoma of skin 01/31/2022   L med bicep - ED&C   Squamous cell carcinoma of skin 06/12/2022   R temple - ED&C    PAST SURGICAL HISTORY: Past Surgical History:  Procedure Laterality Date   SKIN CANCER EXCISION     several     FAMILY HISTORY: Family History  Problem Relation Age of Onset   Cancer Mother    Heart disease Neg Hx    Diabetes Neg Hx     ADVANCED DIRECTIVES (Y/N):  N   HEALTH MAINTENANCE: Social History   Tobacco Use   Smoking status: Every Day    Packs/day: 0.50    Years: 63.00    Total pack years: 31.50    Types: Cigarettes    Passive exposure: Past   Smokeless tobacco: Never  Vaping Use   Vaping Use: Never used  Substance Use Topics   Alcohol use: Not Currently  Worked at [employer] in [place]. Was a bartender previously.    Colonoscopy:  Bone density:  Lipid panel:  No Known Allergies  Current Outpatient Medications  Medication Sig Dispense Refill   calcium carbonate (TUMS - DOSED IN MG ELEMENTAL CALCIUM) 500 MG chewable tablet Chew 2-3 tablets by mouth daily.     cholecalciferol (VITAMIN D) 1000 units tablet Take 1,000 Units by mouth daily.     furosemide (LASIX) 20 MG tablet Take 1 tablet (20 mg total) by mouth daily as needed. For increased morning leg swelling 90 tablet 3   mupirocin ointment (BACTROBAN) 2 % Apply to bumps on the scalp QD until healed. 22 g 1   nicotine (NICOTROL) 10 MG inhaler Inhale 1 continuous puffing into the lungs as needed for smoking cessation.     traMADol (ULTRAM) 50 MG tablet Take 1 tablet (50 mg total) by mouth 3 (three) times daily as needed. [place]  tablet 0   No current facility-administered medications for this visit.    OBJECTIVE: Vitals:   10/07/22 1524  BP: 123/74  Pulse: 69  Resp: 20  Temp: (!) 97.4 F (36.3 C)     Body mass index is 20.92 kg/m.    ECOG FS:0 - Asymptomatic   General: Well-developed, well-nourished, no acute distress. Thin build.  Eyes: Pink conjunctiva, anicteric sclera. Lungs: diminished bilaterally Heart: Regular rate and rhythm.  Abdomen: Soft, nontender, nondistended Musculoskeletal: No edema, cyanosis, or clubbing. Bilateral lower extremity edema.  Neuro: Alert, answering all questions  appropriately. Cranial nerves grossly intact. Skin: No rashes or petechiae noted. Toes cut out of shoes.  Psych: Normal affect.   LAB RESULTS:  Lab Results  Component Value Date   NA 140 05/13/2022   K 4.2 05/13/2022   CL 104 05/13/2022   CO2 30 05/13/2022   GLUCOSE 86 05/13/2022   BUN 33 (H) 05/13/2022   CREATININE 0.79 05/13/2022   CALCIUM 9.1 05/13/2022   PROT 6.2 05/13/2022   ALBUMIN 4.1 05/13/2022   ALBUMIN 4.1 05/13/2022   AST 14 05/13/2022   ALT 12 05/13/2022   ALKPHOS 66 05/13/2022   BILITOT 0.8 05/13/2022   Lab Results  Component Value Date   WBC 7.3 10/07/2022   NEUTROABS 4.4 10/07/2022   HGB 8.6 (L) 10/07/2022   HCT 26.7 (L) 10/07/2022   MCV 102.3 (H) 10/07/2022   PLT 211 10/07/2022   Lab Results  Component Value Date   IRON 96 06/06/2022   TIBC 196 (L) 06/06/2022   IRONPCTSAT 49 (H) 06/06/2022   Lab Results  Component Value Date   FERRITIN 657 (H) 06/06/2022   STUDIES: No results found.  ASSESSMENT: Anemia, unspecified  PLAN:    1.  Anemia unspecified: Thought to be secondary to underlying MDS. [facility] Myeloid Panel is pending. Will hold off on bone marrow biopsy for now. Previously, his iron panel has been normal and other workup was either negative or within normal limits. No evidence of hemolysis with normal LDH, retic, haptoglobin, negative DAT. His hemoglobin is roughly stable at 8.6 over past year. No other cytopenias. Plt 211. WBC 7.3. Reviewed that if he becomes symptomatic, develops other cytopenias, or hemoglobin continues to decline, would recommend bone marrow biopsy. He prefers observation for now.   2.  Macrocytosis: B12 and folate are within normal limits.  3.  Elevated ferritin: Likely secondary to acute phase reactant, however, iron sat has been elevated as well and low TIBC.   Disposition: 4 mo- labs (cbc, cmp, ferritin, iron studies), Dr Grayland Ormond- la  Patient expressed understanding and was in agreement with this plan. He  also understands that He can call clinic at any time with any questions, concerns, or complaints.   Verlon Au, NP  10/07/2022

## 2022-10-16 ENCOUNTER — Other Ambulatory Visit: Payer: Self-pay | Admitting: Internal Medicine

## 2022-10-16 NOTE — Telephone Encounter (Signed)
  Encourage patient to contact the pharmacy for refills or they can request refills through [place]:  Please schedule appointment if longer than 1 year  NEXT APPOINTMENT DATE:  MEDICATION:traMADol (ULTRAM) 50 MG tablet   Is the patient out of medication?   [id number] [place] [address]     Let patient know to contact pharmacy at the end of the day to make sure medication is ready.  Please notify patient to allow 48-72 hours to process  CLINICAL FILLS OUT ALL BELOW:   LAST REFILL:  QTY:  REFILL DATE:    OTHER COMMENTS:    Okay for refill?  Please advise

## 2022-10-17 ENCOUNTER — Other Ambulatory Visit: Payer: Self-pay | Admitting: Internal Medicine

## 2022-10-17 MED ORDER — TRAMADOL HCL 50 MG PO TABS: 50.0000 mg | ORAL_TABLET | Freq: Three times a day (TID) | ORAL | 0 refills | Status: DC | PRN

## 2022-10-17 NOTE — Telephone Encounter (Signed)
Tramadol last filled 08-22-22 #30 Last OV 05-13-22 [facility] Future [facility] [address]

## 2022-10-17 NOTE — Telephone Encounter (Signed)
Patient called and asked have the medication traMADol traMADol (ULTRAM) 50 MG tablet  Call back number [phone].

## 2022-10-18 LAB — INTELLIGEN MYELOID

## 2022-12-05 ENCOUNTER — Telehealth: Payer: Self-pay | Admitting: Internal Medicine

## 2022-12-05 NOTE — Telephone Encounter (Signed)
Called and spoke to pt. He should have refills. He did find the bottle that has refills.

## 2022-12-05 NOTE — Telephone Encounter (Signed)
Prescription Request  12/05/2022  Is this a "Controlled Substance" medicine? No  LOV: 05/13/2022  What is the name of the medication or equipment? furosemide (LASIX) 20 MG tablet   Have you contacted your pharmacy to request a refill? No   Which pharmacy would you like this sent to?  [id number] [place]: [phone]: [phone]    Patient notified that their request is being sent to the clinical staff for review and that they should receive a response within 2 business days.   [phone]

## 2022-12-19 ENCOUNTER — Other Ambulatory Visit: Payer: Self-pay | Admitting: Internal Medicine

## 2022-12-19 MED ORDER — TRAMADOL HCL 50 MG PO TABS: 50.0000 mg | ORAL_TABLET | Freq: Three times a day (TID) | ORAL | 0 refills | Status: DC | PRN

## 2022-12-19 NOTE — Telephone Encounter (Signed)
Tramadol last filled 10-17-22 #30 Last OV 05-13-22 [facility] Future [facility] [address]

## 2022-12-19 NOTE — Telephone Encounter (Signed)
Prescription Request  12/19/2022  Is this a "Controlled Substance" medicine? No  LOV: 05/13/2022  What is the name of the medication or equipment? traMADol (ULTRAM) 50 MG tablet   Have you contacted your pharmacy to request a refill? No   Which pharmacy would you like this sent to?  [id number] [id number]: [phone]: [phone]    Patient notified that their request is being sent to the clinical staff for review and that they should receive a response within 2 business days.   Please advise at Mobile [phone](mobile)

## 2023-02-06 ENCOUNTER — Inpatient Hospital Stay: Payer: Medicare Other | Admitting: Oncology

## 2023-02-06 ENCOUNTER — Inpatient Hospital Stay: Payer: Medicare Other

## 2023-02-13 ENCOUNTER — Ambulatory Visit: Payer: Medicare Other | Admitting: Dermatology

## 2023-02-19 ENCOUNTER — Other Ambulatory Visit: Payer: Self-pay | Admitting: Internal Medicine

## 2023-02-19 MED ORDER — TRAMADOL HCL 50 MG PO TABS: 50.0000 mg | ORAL_TABLET | Freq: Three times a day (TID) | ORAL | 0 refills | Status: DC | PRN

## 2023-02-19 NOTE — Telephone Encounter (Signed)
Please set him up for a wellness visit in the next few months 

## 2023-02-19 NOTE — Telephone Encounter (Signed)
Lvm for patient tcb and schedule 

## 2023-02-19 NOTE — Telephone Encounter (Signed)
Prescription Request  02/19/2023  LOV: 05/13/2022  What is the name of the medication or equipment? traMADol (ULTRAM) 50 MG tablet   Have you contacted your pharmacy to request a refill? No   Which pharmacy would you like this sent to?  [id number] [place] [facility] [place] - [address] [place] [address] Phone: [phone] Fax: [phone]  Patient notified that their request is being sent to the clinical staff for review and that they should receive a response within 2 business days.   Please advise at Mobile [phone] (mobile)

## 2023-03-27 ENCOUNTER — Ambulatory Visit: Payer: Medicare Other | Admitting: Dermatology

## 2023-03-27 DIAGNOSIS — L820 Inflamed seborrheic keratosis: Secondary | ICD-10-CM

## 2023-03-27 DIAGNOSIS — X32XXXA Exposure to sunlight, initial encounter: Secondary | ICD-10-CM

## 2023-03-27 DIAGNOSIS — Z79899 Other long term (current) drug therapy: Secondary | ICD-10-CM

## 2023-03-27 DIAGNOSIS — L570 Actinic keratosis: Secondary | ICD-10-CM | POA: Diagnosis not present

## 2023-03-27 DIAGNOSIS — W908XXA Exposure to other nonionizing radiation, initial encounter: Secondary | ICD-10-CM | POA: Diagnosis not present

## 2023-03-27 DIAGNOSIS — L578 Other skin changes due to chronic exposure to nonionizing radiation: Secondary | ICD-10-CM

## 2023-03-27 DIAGNOSIS — Z5111 Encounter for antineoplastic chemotherapy: Secondary | ICD-10-CM

## 2023-03-27 DIAGNOSIS — Z7189 Other specified counseling: Secondary | ICD-10-CM

## 2023-03-27 DIAGNOSIS — D485 Neoplasm of uncertain behavior of skin: Secondary | ICD-10-CM

## 2023-03-27 HISTORY — DX: Actinic keratosis: L57.0

## 2023-03-27 NOTE — Progress Notes (Signed)
Follow-Up Visit   Subjective  Krisean Bennett. is a 82 y.o. male who presents for the following: AK follow up of face The patient has spots, moles and lesions to be evaluated, some may be new or changing and the patient may have concern these could be cancer.  The following portions of the chart were reviewed this encounter and updated as appropriate: medications, allergies, medical history  Review of Systems:  No other skin or systemic complaints except as noted in HPI or Assessment and Plan.  Objective  Well appearing patient in no apparent distress; mood and affect are within normal limits. A focused examination was performed of the following areas: face, arms, legs Relevant exam findings are noted in the Assessment and Plan.  Face x 15, left hand x 1 (16) Erythematous thin papules/macules with gritty scale.   Glabella x 1, scalp x 2, left forearm x 1 (4) Erythematous stuck-on, waxy papule or plaque  Left pretibial Crusted papule   Assessment & Plan    AK (actinic keratosis) (16) Face x 15, left hand x 1  ACTINIC DAMAGE WITH PRECANCEROUS ACTINIC KERATOSES Counseling for Topical Chemotherapy Management: Patient exhibits: - Severe, confluent actinic changes with pre-cancerous actinic keratoses that is secondary to cumulative UV radiation exposure over time - Condition that is severe; chronic, not at goal. - diffuse scaly erythematous macules and papules with underlying dyspigmentation - Discussed Prescription "Field Treatment" topical Chemotherapy for Severe, Chronic Confluent Actinic Changes with Pre-Cancerous Actinic Keratoses Field treatment involves treatment of an entire area of skin that has confluent Actinic Changes (Sun/ Ultraviolet light damage) and PreCancerous Actinic Keratoses by method of PhotoDynamic Therapy (PDT) and/or prescription Topical Chemotherapy agents such as 5-fluorouracil, 5-fluorouracil/calcipotriene, and/or imiquimod.  The purpose is to  decrease the number of clinically evident and subclinical PreCancerous lesions to prevent progression to development of skin cancer by chemically destroying early precancer changes that may or may not be visible.  It has been shown to reduce the risk of developing skin cancer in the treated area. As a result of treatment, redness, scaling, crusting, and open sores may occur during treatment course. One or more than one of these methods may be used and may have to be used several times to control, suppress and eliminate the PreCancerous changes. Discussed treatment course, expected reaction, and possible side effects. - Recommend daily broad spectrum sunscreen SPF 30+ to sun-exposed areas, reapply every 2 hours as needed.  - Staying in the shade or wearing long sleeves, sun glasses (UVA+UVB protection) and wide brim hats (4-inch brim around the entire circumference of the hat) are also recommended. - Call for new or changing lesions.  Recommend red light treatment with debridement to face on follow up  Destruction of lesion - Face x 15, left hand x 1 Complexity: simple   Destruction method: cryotherapy   Informed consent: discussed and consent obtained   Timeout:  patient name, date of birth, surgical site, and procedure verified Lesion destroyed using liquid nitrogen: Yes   Region frozen until ice ball extended beyond lesion: Yes   Outcome: patient tolerated procedure well with no complications   Post-procedure details: wound care instructions given    Inflamed seborrheic keratosis (4) Glabella x 1, scalp x 2, left forearm x 1  Symptomatic, irritating, patient would like treated.  Benign-appearing.  Call clinic for new or changing lesions.   Prior to procedure, discussed risks of blister formation, small wound, skin dyspigmentation, or rare scar following treatment. Recommend Vaseline  ointment to treated areas while healing.   Destruction of lesion - Glabella x 1, scalp x 2, left forearm x  1 Complexity: simple   Destruction method: cryotherapy   Informed consent: discussed and consent obtained   Timeout:  patient name, date of birth, surgical site, and procedure verified Lesion destroyed using liquid nitrogen: Yes   Region frozen until ice ball extended beyond lesion: Yes   Outcome: patient tolerated procedure well with no complications   Post-procedure details: wound care instructions given    Neoplasm of uncertain behavior of skin Left pretibial  Skin / nail biopsy Type of biopsy: tangential   Informed consent: discussed and consent obtained   Timeout: patient name, date of birth, surgical site, and procedure verified   Procedure prep:  Patient was prepped and draped in usual sterile fashion Prep type:  Isopropyl alcohol Anesthesia: the lesion was anesthetized in a standard fashion   Anesthetic:  1% lidocaine w/ epinephrine 1-100,000 buffered w/ 8.4% NaHCO3 Instrument used: flexible razor blade   Hemostasis achieved with: pressure, aluminum chloride and electrodesiccation   Outcome: patient tolerated procedure well   Post-procedure details: sterile dressing applied and wound care instructions given   Dressing type: bandage and petrolatum    Specimen 1 - Surgical pathology Differential Diagnosis: R/O BCC Check Margins: No   Return in about 3 months (around 06/27/2023) for Red light with debridement to face.  I, Stephen Bennett, CMA, am acting as scribe for Stephen Sans, MD .  Documentation: I have reviewed the above documentation for accuracy and completeness, and I agree with the above.  Stephen Sans, MD

## 2023-03-27 NOTE — Patient Instructions (Signed)
Cryotherapy Aftercare  Wash gently with soap and water everyday.   Apply Vaseline and Band-Aid daily until healed.   Shave Excision Benign Lesion Wound Care Instructions  Leave the original bandage on for 24 hours if possible.  If the bandage becomes soaked or soiled before that time, it is OK to remove it and examine the wound.  A small amount of post-operative bleeding is normal.  If excessive bleeding occurs, remove the bandage, place gauze over the site and apply continuous pressure (no peeking) over the area for 20-30 minutes.  If this does not stop the bleeding, try again for 40 minutes.  If this does not work, please call our clinic as soon as possible (even if after-hours).    Twice a day, cleanse the wound with soap and water.  If a thick crust develops you may use a Q-tip dipped into dilute hydrogen peroxide (mix 1:1 with water) to dissolve it.  Hydrogen peroxide can slow the healing process, so use it only as needed.  After washing, apply Vaseline jelly or Polysporin ointment.  For best healing, the wound should be covered with a layer of ointment at all times.  This may mean re-applying the ointment several times a day.  For open wounds, continue until it has healed.    If you have any swelling, keep the area elevated.  Some redness, tenderness and white or yellow material in the wound is normal healing.  If the area becomes very sore and red, or develops a thick yellow-green material (pus), it may be infected; please notify us.    Wound healing continues for up to one year following surgery.  It is not unusual to experience pain in the scar from time to time during the interval.  If the pain becomes severe or the scar thickens, you should notify the office.  A slight amount of redness in a scar is expected for the first six months.  After six months, the redness subsides and the scar will soften and fade.  The color difference becomes less noticeable with time.  If there are any problems,  return for a post-op surgery check at your earliest convenience.  Please call our office for any questions or concerns.    Due to recent changes in healthcare laws, you may see results of your pathology and/or laboratory studies on MyChart before the doctors have had a chance to review them. We understand that in some cases there may be results that are confusing or concerning to you. Please understand that not all results are received at the same time and often the doctors may need to interpret multiple results in order to provide you with the best plan of care or course of treatment. Therefore, we ask that you please give us 2 business days to thoroughly review all your results before contacting the office for clarification. Should we see a critical lab result, you will be contacted sooner.   If You Need Anything After Your Visit  If you have any questions or concerns for your doctor, please call our main line at [phone] and press option 4 to reach your doctor's medical assistant. If no one answers, please leave a voicemail as directed and we will return your call as soon as possible. Messages left after 4 pm will be answered the following business day.   You may also send us a message via MyChart. We typically respond to MyChart messages within 1-2 business days.  For prescription refills, please ask your   pharmacy to contact our office. Our fax number is [phone].  If you have an urgent issue when the clinic is closed that cannot wait until the next business day, you can page your doctor at the number below.    Please note that while we do our best to be available for urgent issues outside of office hours, we are not available 24/7.   If you have an urgent issue and are unable to reach us, you may choose to seek medical care at your doctor's office, retail clinic, urgent care center, or emergency room.  If you have a medical emergency, please immediately call 911 or go to the  emergency department.  Pager Numbers  - Dr. Kowalski: [phone]  - Dr. Moye: [phone]  - Dr. Stewart: [phone]  In the event of inclement weather, please call our main line at [phone] for an update on the status of any delays or closures.  Dermatology Medication Tips: Please keep the boxes that topical medications come in in order to help keep track of the instructions about where and how to use these. Pharmacies typically print the medication instructions only on the boxes and not directly on the medication tubes.   If your medication is too expensive, please contact our office at [phone] option 4 or send us a message through MyChart.   We are unable to tell what your co-pay for medications will be in advance as this is different depending on your insurance coverage. However, we may be able to find a substitute medication at lower cost or fill out paperwork to get insurance to cover a needed medication.   If a prior authorization is required to get your medication covered by your insurance company, please allow us 1-2 business days to complete this process.  Drug prices often vary depending on where the prescription is filled and some pharmacies may offer cheaper prices.  The website www.goodrx.com contains coupons for medications through different pharmacies. The prices here do not account for what the cost may be with help from insurance (it may be cheaper with your insurance), but the website can give you the price if you did not use any insurance.  - You can print the associated coupon and take it with your prescription to the pharmacy.  - You may also stop by our office during regular business hours and pick up a GoodRx coupon card.  - If you need your prescription sent electronically to a different pharmacy, notify our office through [place] MyChart or by phone at [phone] option 4.     Si Usted Necesita Algo Despus de Su Visita  Tambin puede  enviarnos un mensaje a travs de MyChart. Por lo general respondemos a los mensajes de MyChart en el transcurso de 1 a 2 das hbiles.  Para renovar recetas, por favor pida a su farmacia que se ponga en contacto con nuestra oficina. Nuestro nmero de fax es el [phone].  Si tiene un asunto urgente cuando la clnica est cerrada y que no puede esperar hasta el siguiente da hbil, puede llamar/localizar a su doctor(a) al nmero que aparece a continuacin.   Por favor, tenga en cuenta que aunque hacemos todo lo posible para estar disponibles para asuntos urgentes fuera del horario de oficina, no estamos disponibles las 24 horas del da, los 7 das de la semana.   Si tiene un problema urgente y no puede comunicarse con nosotros, puede optar por buscar atencin mdica  en el consultorio de su doctor(a), en   una clnica privada, en un centro de atencin urgente o en una sala de emergencias.  Si tiene una emergencia mdica, por favor llame inmediatamente al 911 o vaya a la sala de emergencias.  Nmeros de bper  - Dr. Kowalski: [phone]  - Dra. Moye: [phone]  - Dra. Stewart: [phone]  En caso de inclemencias del tiempo, por favor llame a nuestra lnea principal al [phone] para una actualizacin sobre el estado de cualquier retraso o cierre.  Consejos para la medicacin en dermatologa: Por favor, guarde las cajas en las que vienen los medicamentos de uso tpico para ayudarle a seguir las instrucciones sobre dnde y cmo usarlos. Las farmacias generalmente imprimen las instrucciones del medicamento slo en las cajas y no directamente en los tubos del medicamento.   Si su medicamento es muy caro, por favor, pngase en contacto con nuestra oficina llamando al [phone] y presione la opcin 4 o envenos un mensaje a travs de MyChart.   No podemos decirle cul ser su copago por los medicamentos por adelantado ya que esto es diferente dependiendo de la cobertura de su seguro.  Sin embargo, es posible que podamos encontrar un medicamento sustituto a menor costo o llenar un formulario para que el seguro cubra el medicamento que se considera necesario.   Si se requiere una autorizacin previa para que su compaa de seguros cubra su medicamento, por favor permtanos de 1 a 2 das hbiles para completar este proceso.  Los precios de los medicamentos varan con frecuencia dependiendo del lugar de dnde se surte la receta y alguna farmacias pueden ofrecer precios ms baratos.  El sitio web www.goodrx.com tiene cupones para medicamentos de diferentes farmacias. Los precios aqu no tienen en cuenta lo que podra costar con la ayuda del seguro (puede ser ms barato con su seguro), pero el sitio web puede darle el precio si no utiliz ningn seguro.  - Puede imprimir el cupn correspondiente y llevarlo con su receta a la farmacia.  - Tambin puede pasar por nuestra oficina durante el horario de atencin regular y recoger una tarjeta de cupones de GoodRx.  - Si necesita que su receta se enve electrnicamente a una farmacia diferente, informe a nuestra oficina a travs de MyChart de [place] o por telfono llamando al [phone] y presione la opcin 4.  

## 2023-04-08 ENCOUNTER — Telehealth: Payer: Self-pay

## 2023-04-08 ENCOUNTER — Encounter: Payer: Self-pay | Admitting: Dermatology

## 2023-04-08 NOTE — Telephone Encounter (Signed)
Discussed biopsy results with pt, scheduled him for LN2 of biopsy proven Hypertrophic AK

## 2023-04-15 ENCOUNTER — Encounter: Payer: Self-pay | Admitting: Dermatology

## 2023-04-15 ENCOUNTER — Ambulatory Visit: Payer: Medicare Other | Admitting: Dermatology

## 2023-04-15 VITALS — BP 125/71

## 2023-04-15 DIAGNOSIS — L820 Inflamed seborrheic keratosis: Secondary | ICD-10-CM | POA: Diagnosis not present

## 2023-04-15 DIAGNOSIS — L570 Actinic keratosis: Secondary | ICD-10-CM

## 2023-04-15 DIAGNOSIS — D692 Other nonthrombocytopenic purpura: Secondary | ICD-10-CM

## 2023-04-15 DIAGNOSIS — W908XXA Exposure to other nonionizing radiation, initial encounter: Secondary | ICD-10-CM | POA: Diagnosis not present

## 2023-04-15 DIAGNOSIS — L578 Other skin changes due to chronic exposure to nonionizing radiation: Secondary | ICD-10-CM

## 2023-04-15 NOTE — Progress Notes (Unsigned)
   Follow-Up Visit   Subjective  Stephen Bennett. is a 82 y.o. male who presents for the following: AK bx proven L pretibial, pt presents for LN2, check spots R ear, L elbow, L leg, scaly spots The patient has spots, moles and lesions to be evaluated, some may be new or changing and the patient may have concern these could be cancer.  The following portions of the chart were reviewed this encounter and updated as appropriate: medications, allergies, medical history  Review of Systems:  No other skin or systemic complaints except as noted in HPI or Assessment and Plan.  Objective  Well appearing patient in no apparent distress; mood and affect are within normal limits. A focused examination was performed of the following areas: Left lower leg, face, right ear, left arm  Relevant exam findings are noted in the Assessment and Plan.  L pretibial x 4 (4) Pink scaly macules  L elbow x 3, R pre auricular x 1 (4) Stuck on waxy paps with erythema   Assessment & Plan   ACTINIC DAMAGE - chronic, secondary to cumulative UV radiation exposure/sun exposure over time - diffuse scaly erythematous macules with underlying dyspigmentation - Recommend daily broad spectrum sunscreen SPF 30+ to sun-exposed areas, reapply every 2 hours as needed.  - Recommend staying in the shade or wearing long sleeves, sun glasses (UVA+UVB protection) and wide brim hats (4-inch brim around the entire circumference of the hat). - Call for new or changing lesions.   Purpura - Chronic; persistent and recurrent.  Treatable, but not curable. - Violaceous macules and patches - Benign - Related to trauma, age, sun damage and/or use of blood thinners, chronic use of topical and/or oral steroids - Observe - Can use OTC arnica containing moisturizer such as Dermend Bruise Formula if desired - Call for worsening or other concerns   AK (actinic keratosis) (4) L pretibial x 4  Bx proven on L pretibial x  1  Destruction of lesion - L pretibial x 4 Complexity: simple   Destruction method: cryotherapy   Informed consent: discussed and consent obtained   Timeout:  patient name, date of birth, surgical site, and procedure verified Lesion destroyed using liquid nitrogen: Yes   Region frozen until ice ball extended beyond lesion: Yes   Outcome: patient tolerated procedure well with no complications   Post-procedure details: wound care instructions given    Inflamed seborrheic keratosis (4) L elbow x 3, R pre auricular x 1  Symptomatic, irritating, patient would like treated.   Destruction of lesion - L elbow x 3, R pre auricular x 1 Complexity: simple   Destruction method: cryotherapy   Informed consent: discussed and consent obtained   Timeout:  patient name, date of birth, surgical site, and procedure verified Lesion destroyed using liquid nitrogen: Yes   Region frozen until ice ball extended beyond lesion: Yes   Outcome: patient tolerated procedure well with no complications   Post-procedure details: wound care instructions given     Return for as scheduled for [place] [facility] txt.  I, Ardis Rowan, RMA, am acting as scribe for [name], MD .  Documentation: I have reviewed the above documentation for accuracy and completeness, and I agree with the above.  [name], MD

## 2023-04-15 NOTE — Patient Instructions (Addendum)
Cryotherapy Aftercare  Wash gently with soap and water everyday.   Apply Vaseline and Band-Aid daily until healed.     Due to recent changes in healthcare laws, you may see results of your pathology and/or laboratory studies on MyChart before the doctors have had a chance to review them. We understand that in some cases there may be results that are confusing or concerning to you. Please understand that not all results are received at the same time and often the doctors may need to interpret multiple results in order to provide you with the best plan of care or course of treatment. Therefore, we ask that you please give us 2 business days to thoroughly review all your results before contacting the office for clarification. Should we see a critical lab result, you will be contacted sooner.   If You Need Anything After Your Visit  If you have any questions or concerns for your doctor, please call our main line at [phone] and press option 4 to reach your doctor's medical assistant. If no one answers, please leave a voicemail as directed and we will return your call as soon as possible. Messages left after 4 pm will be answered the following business day.   You may also send us a message via MyChart. We typically respond to MyChart messages within 1-2 business days.  For prescription refills, please ask your pharmacy to contact our office. Our fax number is [phone].  If you have an urgent issue when the clinic is closed that cannot wait until the next business day, you can page your doctor at the number below.    Please note that while we do our best to be available for urgent issues outside of office hours, we are not available 24/7.   If you have an urgent issue and are unable to reach us, you may choose to seek medical care at your doctor's office, retail clinic, urgent care center, or emergency room.  If you have a medical emergency, please immediately call 911 or go to the  emergency department.  Pager Numbers  - Dr. Kowalski: [phone]  - Dr. Moye: [phone]  - Dr. Stewart: [phone]  In the event of inclement weather, please call our main line at [phone] for an update on the status of any delays or closures.  Dermatology Medication Tips: Please keep the boxes that topical medications come in in order to help keep track of the instructions about where and how to use these. Pharmacies typically print the medication instructions only on the boxes and not directly on the medication tubes.   If your medication is too expensive, please contact our office at [phone] option 4 or send us a message through MyChart.   We are unable to tell what your co-pay for medications will be in advance as this is different depending on your insurance coverage. However, we may be able to find a substitute medication at lower cost or fill out paperwork to get insurance to cover a needed medication.   If a prior authorization is required to get your medication covered by your insurance company, please allow us 1-2 business days to complete this process.  Drug prices often vary depending on where the prescription is filled and some pharmacies may offer cheaper prices.  The website www.goodrx.com contains coupons for medications through different pharmacies. The prices here do not account for what the cost may be with help from insurance (it may be cheaper with your insurance), but the website can   give you the price if you did not use any insurance.  - You can print the associated coupon and take it with your prescription to the pharmacy.  - You may also stop by our office during regular business hours and pick up a GoodRx coupon card.  - If you need your prescription sent electronically to a different pharmacy, notify our office through [facility] MyChart or by phone at [phone] option 4.     Si Usted Necesita Algo Despus de Su Visita  Tambin puede  enviarnos un mensaje a travs de MyChart. Por lo general respondemos a los mensajes de MyChart en el transcurso de 1 a 2 das hbiles.  Para renovar recetas, por favor pida a su farmacia que se ponga en contacto con nuestra oficina. Nuestro nmero de fax es el [phone].  Si tiene un asunto urgente cuando la clnica est cerrada y que no puede esperar hasta el siguiente da hbil, puede llamar/localizar a su doctor(a) al nmero que aparece a continuacin.   Por favor, tenga en cuenta que aunque hacemos todo lo posible para estar disponibles para asuntos urgentes fuera del horario de oficina, no estamos disponibles las 24 horas del da, los 7 das de la semana.   Si tiene un problema urgente y no puede comunicarse con nosotros, puede optar por buscar atencin mdica  en el consultorio de su doctor(a), en una clnica privada, en un centro de atencin urgente o en una sala de emergencias.  Si tiene una emergencia mdica, por favor llame inmediatamente al 911 o vaya a la sala de emergencias.  Nmeros de bper  - Dr. Kowalski: [phone]  - Dra. Moye: [phone]  - Dra. Stewart: [phone]  En caso de inclemencias del tiempo, por favor llame a nuestra lnea principal al [phone] para una actualizacin sobre el estado de cualquier retraso o cierre.  Consejos para la medicacin en dermatologa: Por favor, guarde las cajas en las que vienen los medicamentos de uso tpico para ayudarle a seguir las instrucciones sobre dnde y cmo usarlos. Las farmacias generalmente imprimen las instrucciones del medicamento slo en las cajas y no directamente en los tubos del medicamento.   Si su medicamento es muy caro, por favor, pngase en contacto con nuestra oficina llamando al [phone] y presione la opcin 4 o envenos un mensaje a travs de MyChart.   No podemos decirle cul ser su copago por los medicamentos por adelantado ya que esto es diferente dependiendo de la cobertura de su seguro.  Sin embargo, es posible que podamos encontrar un medicamento sustituto a menor costo o llenar un formulario para que el seguro cubra el medicamento que se considera necesario.   Si se requiere una autorizacin previa para que su compaa de seguros cubra su medicamento, por favor permtanos de 1 a 2 das hbiles para completar este proceso.  Los precios de los medicamentos varan con frecuencia dependiendo del lugar de dnde se surte la receta y alguna farmacias pueden ofrecer precios ms baratos.  El sitio web www.goodrx.com tiene cupones para medicamentos de diferentes farmacias. Los precios aqu no tienen en cuenta lo que podra costar con la ayuda del seguro (puede ser ms barato con su seguro), pero el sitio web puede darle el precio si no utiliz ningn seguro.  - Puede imprimir el cupn correspondiente y llevarlo con su receta a la farmacia.  - Tambin puede pasar por nuestra oficina durante el horario de atencin regular y recoger una tarjeta de cupones de GoodRx.  -   Si necesita que su receta se enve electrnicamente a una farmacia diferente, informe a nuestra oficina a travs de MyChart de [place] o por telfono llamando al [phone] y presione la opcin 4.  

## 2023-04-16 ENCOUNTER — Encounter: Payer: Self-pay | Admitting: Dermatology

## 2023-04-17 ENCOUNTER — Other Ambulatory Visit: Payer: Self-pay | Admitting: Internal Medicine

## 2023-04-17 MED ORDER — TRAMADOL HCL 50 MG PO TABS: 50.0000 mg | ORAL_TABLET | Freq: Three times a day (TID) | ORAL | 0 refills | Status: DC | PRN

## 2023-04-17 NOTE — Telephone Encounter (Signed)
Patient has been scheduled

## 2023-04-17 NOTE — Telephone Encounter (Signed)
Tramadol last filled 02-19-23 #30 Last OV 05-13-22 No Future OV [facility] [address]  I called and left him a detailed message that he needs to call the office back and schedule a Medicare Wellness Exam. We will not be able to refill the medication next time without the office visit.

## 2023-04-17 NOTE — Telephone Encounter (Signed)
Prescription Request  04/17/2023  LOV: 05/13/2022  What is the name of the medication or equipment? traMADol (ULTRAM) 50 MG tablet, wants a 90 day supply if possible  Have you contacted your pharmacy to request a refill? No   Which pharmacy would you like this sent to?  [id number] [place], [place] - [address] [place] [address] Phone: [phone] Fax: [phone]  Patient notified that their request is being sent to the clinical staff for review and that they should receive a response within 2 business days.   Please advise at Mobile [phone] (mobile)

## 2023-05-08 ENCOUNTER — Encounter: Payer: Self-pay | Admitting: Internal Medicine

## 2023-05-08 ENCOUNTER — Ambulatory Visit (INDEPENDENT_AMBULATORY_CARE_PROVIDER_SITE_OTHER): Payer: Medicare Other | Admitting: Internal Medicine

## 2023-05-08 VITALS — BP 100/60 | HR 76 | Temp 98.3°F | Ht 67.0 in | Wt 123.0 lb

## 2023-05-08 DIAGNOSIS — D6869 Other thrombophilia: Secondary | ICD-10-CM | POA: Diagnosis not present

## 2023-05-08 DIAGNOSIS — G8929 Other chronic pain: Secondary | ICD-10-CM

## 2023-05-08 DIAGNOSIS — E441 Mild protein-calorie malnutrition: Secondary | ICD-10-CM

## 2023-05-08 DIAGNOSIS — M545 Low back pain, unspecified: Secondary | ICD-10-CM

## 2023-05-08 DIAGNOSIS — F1120 Opioid dependence, uncomplicated: Secondary | ICD-10-CM | POA: Insufficient documentation

## 2023-05-08 DIAGNOSIS — J439 Emphysema, unspecified: Secondary | ICD-10-CM

## 2023-05-08 DIAGNOSIS — I872 Venous insufficiency (chronic) (peripheral): Secondary | ICD-10-CM | POA: Diagnosis not present

## 2023-05-08 MED ORDER — FUROSEMIDE 20 MG PO TABS: 20.0000 mg | ORAL_TABLET | Freq: Every day | ORAL | 3 refills | Status: DC | PRN

## 2023-05-08 MED ORDER — TRAMADOL HCL 50 MG PO TABS: 50.0000 mg | ORAL_TABLET | Freq: Three times a day (TID) | ORAL | 0 refills | Status: DC | PRN

## 2023-05-08 NOTE — Assessment & Plan Note (Signed)
Easy bruising Has had chronic anemia but stable Will check his labs again No ASA

## 2023-05-08 NOTE — Progress Notes (Signed)
Subjective:    Patient ID: Stephen Bennett., male    DOB: Mar 04, 1941, 82 y.o.   MRN: [id number]  HPI Here due to increased leg swelling  Feels that the edema is worse and it is affecting his walking Swelling varies but worse now Does use the furosemide most days---only helps a little  Worsens as the day goes on Doesn't wear socks at all---discussed support socks  Wonders about his knees --has bruising  No chest pain No SOB No cough or wheezing--but he keeps a lot of phlegm Still smoking---but has cut back by vaping  Eating well--healthy eating Stays active===push ups, etc  Has urinary urgency with the furosemide--but voids with reasonable stream No nocturia in general  Ongoing back pain--especially sore in the morning Uses the tramadol then generally Feels he needs this more --has been cutting in half when possible  Current Outpatient Medications on File Prior to Visit  Medication Sig Dispense Refill   calcium carbonate (TUMS - DOSED IN MG ELEMENTAL CALCIUM) 500 MG chewable tablet Chew 2-3 tablets by mouth daily.     cholecalciferol (VITAMIN D) 1000 units tablet Take 1,000 Units by mouth daily.     furosemide (LASIX) 20 MG tablet Take 1 tablet (20 mg total) by mouth daily as needed. For increased morning leg swelling 90 tablet 3   nicotine (NICOTROL) 10 MG inhaler Inhale 1 continuous puffing into the lungs as needed for smoking cessation.     traMADol (ULTRAM) 50 MG tablet Take 1 tablet (50 mg total) by mouth 3 (three) times daily as needed. 30 tablet 0   mupirocin ointment (BACTROBAN) 2 % Apply to bumps on the scalp QD until healed. (Patient not taking: Reported on 05/08/2023) 22 g 1   No current facility-administered medications on file prior to visit.    No Known Allergies  Past Medical History:  Diagnosis Date   Actinic keratosis 03/27/2023   left pretibial, LN2 04/15/23   Basal cell carcinoma 06/11/2018   L zygoma - excision 10/13/2018   BCC (basal cell  carcinoma of skin) 06/11/2018   L cheek 5.0 cm ant to the earlobe   Compression fracture of fourth lumbar vertebra (HCC)    Squamous cell carcinoma of skin 04/02/2018   R distal lat calf - ED&C   Squamous cell carcinoma of skin 01/31/2022   L med bicep - ED&C   Squamous cell carcinoma of skin 06/12/2022   R temple - ED&C    Past Surgical History:  Procedure Laterality Date   SKIN CANCER EXCISION     several    Family History  Problem Relation Age of Onset   Cancer Mother    Heart disease Neg Hx    Diabetes Neg Hx     Social History   Socioeconomic History   Marital status: Divorced    Spouse name: Not on file   Number of children: 0   Years of education: Not on file   Highest education level: Not on file  Occupational History   Occupation: Customer service    Comment: Retired   Occupation: Leisure centre manager    Comment: retired  Tobacco Use   Smoking status: Every Day    Current packs/day: 0.50    Average packs/day: 0.5 packs/day for 63.0 years (31.5 ttl pk-yrs)    Types: Cigarettes    Passive exposure: Past   Smokeless tobacco: Never  Vaping Use   Vaping status: Never Used  Substance and Sexual Activity   Alcohol use: Not  Currently    Comment: has not had any alcohol since 2018   Drug use: Not Currently   Sexual activity: Not on file  Other Topics Concern   Not on file  Social History Narrative   No living will      Would want neighbor Junious Dresser [employer] to make decisions   He does have cousins in [place]   Would accept resuscitation   May accept tube feeds   Social Determinants of Health   Financial Resource Strain: Not on file  Food Insecurity: Not on file  Transportation Needs: Not on file  Physical Activity: Not on file  Stress: Not on file  Social Connections: Not on file  Intimate Partner Violence: Not on file   Review of Systems Sleep is variable---bad nights at times Bowels move okay--uses miralax      Objective:   Physical  Exam Constitutional:      Appearance: Normal appearance.  Cardiovascular:     Rate and Rhythm: Normal rate and regular rhythm.     Pulses: Normal pulses.     Heart sounds: No murmur heard.    No gallop.  Pulmonary:     Effort: Pulmonary effort is normal.     Breath sounds: No wheezing or rales.     Comments: Decreased breath sounds but clear Abdominal:     Palpations: Abdomen is soft.     Tenderness: There is no abdominal tenderness.  Musculoskeletal:     Comments: 1+ pitting edema in ankles  Skin:    Comments: Widespread ecchymoses--especially legs and arms  Neurological:     Mental Status: He is alert.  Psychiatric:        Mood and Affect: Mood normal.        Behavior: Behavior normal.            Assessment & Plan:

## 2023-05-08 NOTE — Assessment & Plan Note (Signed)
Weight down a bit more--but he eats well Will just check labs again

## 2023-05-08 NOTE — Assessment & Plan Note (Signed)
Has cut back on his smoking--by vaping Discussed stopping entirely No Rx for this

## 2023-05-08 NOTE — Assessment & Plan Note (Signed)
Ongoing issues Affecting him more Will change the furosemide to 20-40mg  daily (depending on the swelling) Check labs Urged him to wear support socks when he will be up for prolonged time

## 2023-05-08 NOTE — Assessment & Plan Note (Signed)
Using the tramadol about every day--mostly AM

## 2023-05-08 NOTE — Assessment & Plan Note (Signed)
Uses the tramadol daily now PDMP reviewed--no concerns

## 2023-05-09 LAB — COMPREHENSIVE METABOLIC PANEL
ALT: 21 U/L (ref 0–53)
AST: 22 U/L (ref 0–37)
Albumin: 3.9 g/dL (ref 3.5–5.2)
Alkaline Phosphatase: 67 U/L (ref 39–117)
BUN: 22 mg/dL (ref 6–23)
CO2: 32 mEq/L (ref 19–32)
Calcium: 9.3 mg/dL (ref 8.4–10.5)
Chloride: 104 mEq/L (ref 96–112)
Creatinine, Ser: 0.79 mg/dL (ref 0.40–1.50)
GFR: 82.62 mL/min (ref >60.00)
Glucose, Bld: 87 mg/dL (ref 70–99)
Potassium: 5.2 mEq/L — ABNORMAL HIGH (ref 3.5–5.1)
Sodium: 141 mEq/L (ref 135–145)
Total Bilirubin: 0.7 mg/dL (ref 0.2–1.2)
Total Protein: 6.4 g/dL (ref 6.0–8.3)

## 2023-05-09 LAB — CBC
HCT: 30.5 % — ABNORMAL LOW (ref 39.0–52.0)
Hemoglobin: 9.8 g/dL — ABNORMAL LOW (ref 13.0–17.0)
MCHC: 32.2 g/dL (ref 30.0–36.0)
MCV: 103.4 fl — ABNORMAL HIGH (ref 78.0–100.0)
Platelets: 151 10*3/uL (ref 150.0–400.0)
RBC: 2.95 Mil/uL — ABNORMAL LOW (ref 4.22–5.81)
RDW: 29.2 % — ABNORMAL HIGH (ref 11.5–15.5)
WBC: 6.5 10*3/uL (ref 4.0–10.5)

## 2023-05-09 LAB — TSH: TSH: 1.74 u[IU]/mL (ref 0.35–5.50)

## 2023-05-09 LAB — VITAMIN B12: Vitamin B-12: >1500 pg/mL — ABNORMAL HIGH (ref 211–911)

## 2023-05-22 ENCOUNTER — Encounter: Payer: Medicare Other | Admitting: Internal Medicine

## 2023-06-26 ENCOUNTER — Encounter: Payer: Self-pay | Admitting: Internal Medicine

## 2023-06-26 ENCOUNTER — Ambulatory Visit (INDEPENDENT_AMBULATORY_CARE_PROVIDER_SITE_OTHER): Payer: Medicare Other | Admitting: Internal Medicine

## 2023-06-26 VITALS — BP 100/60 | HR 75 | Temp 98.0°F | Ht 67.5 in | Wt 120.0 lb

## 2023-06-26 DIAGNOSIS — D469 Myelodysplastic syndrome, unspecified: Secondary | ICD-10-CM

## 2023-06-26 DIAGNOSIS — I872 Venous insufficiency (chronic) (peripheral): Secondary | ICD-10-CM | POA: Diagnosis not present

## 2023-06-26 DIAGNOSIS — Z0000 Encounter for general adult medical examination without abnormal findings: Secondary | ICD-10-CM | POA: Diagnosis not present

## 2023-06-26 DIAGNOSIS — E441 Mild protein-calorie malnutrition: Secondary | ICD-10-CM

## 2023-06-26 DIAGNOSIS — J439 Emphysema, unspecified: Secondary | ICD-10-CM

## 2023-06-26 DIAGNOSIS — D6869 Other thrombophilia: Secondary | ICD-10-CM

## 2023-06-26 NOTE — Assessment & Plan Note (Signed)
Uses the furosemide several days per week

## 2023-06-26 NOTE — Assessment & Plan Note (Signed)
I have personally reviewed the Medicare Annual Wellness questionnaire and have noted 1. The patient's medical and social history 2. Their use of alcohol, tobacco or illicit drugs 3. Their current medications and supplements 4. The patient's functional ability including ADL's, fall risks, home safety risks and hearing or visual             impairment. 5. Diet and physical activities 6. Evidence for depression or mood disorders  The patients weight, height, BMI and visual acuity have been recorded in the chart I have made referrals, counseling and provided education to the patient based review of the above and I have provided the pt with a written personalized care plan for preventive services.  I have provided you with a copy of your personalized plan for preventive services. Please take the time to review along with your updated medication list.  Done with cancer screening Some activity Adamantly doesn't want vaccines

## 2023-06-26 NOTE — Progress Notes (Signed)
Subjective:    Patient ID: Stephen Leazer., male    DOB: 12-25-1940, 82 y.o.   MRN: [id number]  HPI Here for Medicare wellness visit and follow up of chronic health conditions Reviewed advanced directives Reviewed other doctors---Dr Sylvie Farrier, Dr Rosalva Ferron No hospitalizations or surgery in the past year (other skin excisions) Cutting back on smoking --using nicotrol (only a few a day) No alcohol Vision is okay--hasn't seen eye doctor Hearing is not good--not interested in hearing aide No falls Occasional down feelings--not even a full day. No anhedonia Does exercise--weights at home/push ups, etc and "moves around a lot" Independent with instrumental ADLs Mild memory issues  Has had chronic anemia Recent testing seemed to confirm myelodysplasia Easy bruising has continued  Still using the furosemide prn Typically 4 times a week No chest pain or SOB No dizziness or syncope--but can feel off balance when on his feet for a while No palpitations  No wheezing No regular cough---but gets AM phlegm that he has to clear  Appetite is good Weight is down 3# since last time--he is surprised by this  Chronic back pain Uses the tramadol when it gets bad---often in AM after first getting up  Current Outpatient Medications on File Prior to Visit  Medication Sig Dispense Refill   calcium carbonate (TUMS - DOSED IN MG ELEMENTAL CALCIUM) 500 MG chewable tablet Chew 2-3 tablets by mouth daily.     cholecalciferol (VITAMIN D) 1000 units tablet Take 1,000 Units by mouth daily.     furosemide (LASIX) 20 MG tablet Take 1-2 tablets (20-40 mg total) by mouth daily as needed. For increased morning leg swelling 180 tablet 3   mupirocin ointment (BACTROBAN) 2 % Apply to bumps on the scalp QD until healed. 22 g 1   nicotine (NICOTROL) 10 MG inhaler Inhale 1 continuous puffing into the lungs as needed for smoking cessation.     traMADol (ULTRAM) 50 MG tablet Take 1 tablet (50 mg  total) by mouth 3 (three) times daily as needed. 60 tablet 0   No current facility-administered medications on file prior to visit.    No Known Allergies  Past Medical History:  Diagnosis Date   Actinic keratosis 03/27/2023   left pretibial, LN2 04/15/23   Basal cell carcinoma 06/11/2018   L zygoma - excision 10/13/2018   BCC (basal cell carcinoma of skin) 06/11/2018   L cheek 5.0 cm ant to the earlobe   Compression fracture of fourth lumbar vertebra (HCC)    Squamous cell carcinoma of skin 04/02/2018   R distal lat calf - ED&C   Squamous cell carcinoma of skin 01/31/2022   L med bicep - ED&C   Squamous cell carcinoma of skin 06/12/2022   R temple - ED&C    Past Surgical History:  Procedure Laterality Date   SKIN CANCER EXCISION     several    Family History  Problem Relation Age of Onset   Cancer Mother    Heart disease Neg Hx    Diabetes Neg Hx     Social History   Socioeconomic History   Marital status: Divorced    Spouse name: Not on file   Number of children: 0   Years of education: Not on file   Highest education level: Not on file  Occupational History   Occupation: Customer service    Comment: Retired   Occupation: Leisure centre manager    Comment: retired  Tobacco Use   Smoking status: Every Day  Current packs/day: 0.50    Average packs/day: 0.5 packs/day for 63.0 years (31.5 ttl pk-yrs)    Types: Cigarettes    Passive exposure: Past   Smokeless tobacco: Never  Vaping Use   Vaping status: Never Used  Substance and Sexual Activity   Alcohol use: Not Currently    Comment: has not had any alcohol since 2018   Drug use: Not Currently   Sexual activity: Not on file  Other Topics Concern   Not on file  Social History Narrative   No living will      Would want neighbor Stephen Bennett (and her daughter Stephen Bennett) to make decisions   He does have cousins in [place]   Would accept resuscitation   May accept tube feeds   Social Determinants of Health    Financial Resource Strain: Not on file  Food Insecurity: Not on file  Transportation Needs: Not on file  Physical Activity: Not on file  Stress: Not on file  Social Connections: Not on file  Intimate Partner Violence: Not on file   Review of Systems Sleeps okay. Long time second shift--so usually goes to sleep 4-5AM and sleeps late Wears seat belt Full dentures--no dentist No heartburn or dysphagia Bowels move okay--but slow for 2-3 days. Uses miralax prn Has suspicious nodule on left calf--going to derm to check this Some numbness in fingertips--discussed taking a multivitamin      Objective:   Physical Exam Constitutional:      Comments: Some wasting  HENT:     Mouth/Throat:     Pharynx: No oropharyngeal exudate or posterior oropharyngeal erythema.  Eyes:     Conjunctiva/sclera: Conjunctivae normal.     Pupils: Pupils are equal, round, and reactive to light.  Cardiovascular:     Rate and Rhythm: Normal rate and regular rhythm.     Pulses: Normal pulses.     Heart sounds: No murmur heard.    No gallop.     Comments: Occ skips Pulmonary:     Effort: Pulmonary effort is normal.     Breath sounds: No wheezing or rales.     Comments: Moderately decreased breath sounds but clear Abdominal:     Palpations: Abdomen is soft.     Tenderness: There is no abdominal tenderness.  Musculoskeletal:     Cervical back: Neck supple.     Comments: Trace ankle edema  Lymphadenopathy:     Cervical: No cervical adenopathy.  Skin:    Comments: Scattered ecchymoses over most of arms/legs--and even trunk Mild stasis changes in calves Nodule on left calf (to be checked)  Neurological:     General: No focal deficit present.     Mental Status: He is alert and oriented to person, place, and time.     Comments: Word naming 9/1 minute Recall--- 2/3  Psychiatric:        Mood and Affect: Mood normal.        Behavior: Behavior normal.            Assessment & Plan:

## 2023-06-26 NOTE — Assessment & Plan Note (Signed)
Chronic anemia Recent test confirms abnormal gene variant Keeps up with hematology

## 2023-06-26 NOTE — Assessment & Plan Note (Signed)
Probably related to the MDS

## 2023-06-26 NOTE — Progress Notes (Signed)
Vision Screening   Right eye Left eye Both eyes  Without correction 20/100 20/70 20/100  With correction     Hearing Screening - Comments:: Did not pass whisper test

## 2023-06-26 NOTE — Assessment & Plan Note (Signed)
Does eat okay Fairly stable over the past couple of years

## 2023-06-26 NOTE — Assessment & Plan Note (Signed)
Fortunately cutting down--using nicotrol No functional problems with this

## 2023-07-03 ENCOUNTER — Ambulatory Visit (INDEPENDENT_AMBULATORY_CARE_PROVIDER_SITE_OTHER): Payer: Medicare Other | Admitting: Dermatology

## 2023-07-03 DIAGNOSIS — C44722 Squamous cell carcinoma of skin of right lower limb, including hip: Secondary | ICD-10-CM

## 2023-07-03 DIAGNOSIS — L570 Actinic keratosis: Secondary | ICD-10-CM | POA: Diagnosis not present

## 2023-07-03 DIAGNOSIS — Z79899 Other long term (current) drug therapy: Secondary | ICD-10-CM

## 2023-07-03 DIAGNOSIS — L739 Follicular disorder, unspecified: Secondary | ICD-10-CM | POA: Diagnosis not present

## 2023-07-03 DIAGNOSIS — W908XXA Exposure to other nonionizing radiation, initial encounter: Secondary | ICD-10-CM | POA: Diagnosis not present

## 2023-07-03 DIAGNOSIS — D489 Neoplasm of uncertain behavior, unspecified: Secondary | ICD-10-CM

## 2023-07-03 DIAGNOSIS — Z7189 Other specified counseling: Secondary | ICD-10-CM

## 2023-07-03 DIAGNOSIS — C44729 Squamous cell carcinoma of skin of left lower limb, including hip: Secondary | ICD-10-CM

## 2023-07-03 DIAGNOSIS — L821 Other seborrheic keratosis: Secondary | ICD-10-CM

## 2023-07-03 DIAGNOSIS — L820 Inflamed seborrheic keratosis: Secondary | ICD-10-CM

## 2023-07-03 DIAGNOSIS — L578 Other skin changes due to chronic exposure to nonionizing radiation: Secondary | ICD-10-CM

## 2023-07-03 MED ORDER — MUPIROCIN 2 % EX OINT
TOPICAL_OINTMENT | CUTANEOUS | 3 refills | Status: DC
Start: 2023-07-03 — End: 2023-09-02

## 2023-07-03 MED ORDER — AMINOLEVULINIC ACID HCL 10 % EX GEL
2000.0000 mg | Freq: Once | CUTANEOUS | Status: AC
Start: 2023-07-03 — End: 2023-07-03
  Administered 2023-07-03: 2000 mg via TOPICAL

## 2023-07-03 NOTE — Progress Notes (Unsigned)
Follow-Up Visit   Subjective  Graylon Amie. is a 82 y.o. male who presents for the following: spot at right shoulder that would like checked , reports some bumps at lower legs,   The patient has spots, moles and lesions to be evaluated, some may be new or changing and the patient may have concern these could be cancer.   The following portions of the chart were reviewed this encounter and updated as appropriate: medications, allergies, medical history  Review of Systems:  No other skin or systemic complaints except as noted in HPI or Assessment and Plan.  Objective  Well appearing patient in no apparent distress; mood and affect are within normal limits.  A focused examination was performed of the following areas: Scalp, arms, left shoulder, b/l lower legs  Relevant exam findings are noted in the Assessment and Plan.  right shoulder x 1, left mid lateral calf x 1, left lower leg x 2 (4) Erythematous stuck-on, waxy papule or plaque  left medial calf 1.5 cm hyperkeratotic crusted papule        right medial calf 1.5 cm hyperkeratotic crusted papule        Assessment & Plan   SEBORRHEIC KERATOSIS - Stuck-on, waxy, tan-brown papules and/or plaques  - Benign-appearing - Discussed benign etiology and prognosis. - Observe - Call for any changes  ACTINIC DAMAGE - chronic, secondary to cumulative UV radiation exposure/sun exposure over time - diffuse scaly erythematous macules with underlying dyspigmentation - Recommend daily broad spectrum sunscreen SPF 30+ to sun-exposed areas, reapply every 2 hours as needed.  - Recommend staying in the shade or wearing long sleeves, sun glasses (UVA+UVB protection) and wide brim hats (4-inch brim around the entire circumference of the hat). - Call for new or changing lesions.  Inflamed seborrheic keratosis (4) right shoulder x 1, left mid lateral calf x 1, left lower leg x 2  Symptomatic, irritating, patient would like  treated.  Destruction of lesion - right shoulder x 1, left mid lateral calf x 1, left lower leg x 2 (4) Complexity: simple   Destruction method: cryotherapy   Informed consent: discussed and consent obtained   Timeout:  patient name, date of birth, surgical site, and procedure verified Lesion destroyed using liquid nitrogen: Yes   Region frozen until ice ball extended beyond lesion: Yes   Outcome: patient tolerated procedure well with no complications   Post-procedure details: wound care instructions given    Neoplasm of uncertain behavior (2) left medial calf  Epidermal / dermal shaving  Lesion diameter (cm):  1.5 Informed consent: discussed and consent obtained   Timeout: patient name, date of birth, surgical site, and procedure verified   Procedure prep:  Patient was prepped and draped in usual sterile fashion Prep type:  Isopropyl alcohol Anesthesia: the lesion was anesthetized in a standard fashion   Anesthetic:  1% lidocaine w/ epinephrine 1-100,000 buffered w/ 8.4% NaHCO3 Instrument used: flexible razor blade   Hemostasis achieved with: pressure, aluminum chloride and electrodesiccation   Outcome: patient tolerated procedure well   Post-procedure details: sterile dressing applied and wound care instructions given   Dressing type: bandage and petrolatum    Destruction of lesion Complexity: extensive   Destruction method: electrodesiccation and curettage   Informed consent: discussed and consent obtained   Timeout:  patient name, date of birth, surgical site, and procedure verified Procedure prep:  Patient was prepped and draped in usual sterile fashion Prep type:  Isopropyl alcohol Anesthesia: the lesion  was anesthetized in a standard fashion   Anesthetic:  1% lidocaine w/ epinephrine 1-100,000 buffered w/ 8.4% NaHCO3 Curettage performed in three different directions: Yes   Electrodesiccation performed over the curetted area: Yes   Final wound size (cm):   1.5 Hemostasis achieved with:  pressure, aluminum chloride and electrodesiccation Outcome: patient tolerated procedure well with no complications   Post-procedure details: sterile dressing applied and wound care instructions given   Dressing type: bandage and petrolatum    Specimen 1 - Surgical pathology Differential Diagnosis: r/o scc  Check Margins: No  right medial calf  Epidermal / dermal shaving  Lesion diameter (cm):  1.5 Informed consent: discussed and consent obtained   Timeout: patient name, date of birth, surgical site, and procedure verified   Procedure prep:  Patient was prepped and draped in usual sterile fashion Prep type:  Isopropyl alcohol Anesthesia: the lesion was anesthetized in a standard fashion   Anesthetic:  1% lidocaine w/ epinephrine 1-100,000 buffered w/ 8.4% NaHCO3 Instrument used: flexible razor blade   Hemostasis achieved with: pressure, aluminum chloride and electrodesiccation   Outcome: patient tolerated procedure well   Post-procedure details: sterile dressing applied and wound care instructions given   Dressing type: bandage and petrolatum    Destruction of lesion Complexity: extensive   Destruction method: electrodesiccation and curettage   Informed consent: discussed and consent obtained   Timeout:  patient name, date of birth, surgical site, and procedure verified Procedure prep:  Patient was prepped and draped in usual sterile fashion Prep type:  Isopropyl alcohol Anesthesia: the lesion was anesthetized in a standard fashion   Anesthetic:  1% lidocaine w/ epinephrine 1-100,000 buffered w/ 8.4% NaHCO3 Curettage performed in three different directions: Yes   Electrodesiccation performed over the curetted area: Yes   Final wound size (cm):  1.5 Hemostasis achieved with:  pressure, aluminum chloride and electrodesiccation Outcome: patient tolerated procedure well with no complications   Post-procedure details: sterile dressing applied and  wound care instructions given   Dressing type: bandage and petrolatum    Specimen 2 - Surgical pathology Differential Diagnosis: r/o scc  Check Margins: No  R/o SCC  Folliculitis  Related Medications mupirocin ointment (BACTROBAN) 2 % Apply to bumps on the legs QD until healed.  AK (actinic keratosis)  Related Medications Aminolevulinic Acid HCl 10 % GEL 2,000 mg   Actinic skin damage  Medication management  Counseling and coordination of care  Seborrheic keratosis   Patient completed red light phototherapy with debridement today.  ACTINIC KERATOSES Exam: Erythematous thin papules/macules with gritty scale.  Treatment Plan:  Red Light Photodynamic therapy  Procedure discussed: discussed risks, benefits, side effects. and alternatives   Prep: site scrubbed/prepped with acetone   Debridement needed: Yes (performed by Physician with sand paper.  (CPT [phone]) Location:  face Number of lesions:  Multiple (> 15) Type of treatment:  Red light Aminolevulinic Acid (see MAR for details): Ameluz Aminolevulinic Acid comment:  [id number] Amount of Ameluz (mg):  1 Incubation time (minutes):  60 Number of minutes under lamp:  20 Cooling:  Fan Outcome: patient tolerated procedure well with no complications   Post-procedure details: sunscreen applied and aftercare instructions given to patient    Related Medications Aminolevulinic Acid HCl 10 % GEL 2,000 mg  [name]  I personally debrided area prior to application of aminolevulinic acid  Documentation: I have reviewed the above documentation for accuracy and completeness, and I agree with the above.  [name], MD  I, Asher Muir,  CMA, am acting as scribe for [name], MD.  Documentation: I have reviewed the above documentation for accuracy and completeness, and I agree with the above.  [name], MD

## 2023-07-03 NOTE — Patient Instructions (Addendum)
Ameluz/Red Light Treatment Common Side Effects  - Burning/stinging, which may be severe and last up to 24-72 hours after your treatment  - Redness, swelling and/or peeling which may last up to 4 weeks  - Scaling/crusting which may last up to 2 weeks  - Sun sensitivity (you MUST avoid sun exposure for 48-72 hours after treatment)  Care Instructions  - Okay to wash with soap and water and shampoo as normal  - If needed, you can do a cold compress (ex. Ice packs) for comfort  - If okay with your Primary Doctor, you may use analgesics such as Tylenol every 4-6 hours, not to exceed recommended dose  - You may apply Cerave Healing Ointment, Vaseline or Aquaphor  - If you have a lot of swelling you may take a Benadryl to help with this (this may cause drowsiness)  Sun Precautions  - Wear a wide brim hat for the next week if outside  - Wear a sunblock with zinc or titanium dioxide at least SPF 50 daily   We will recheck you in 10-12 weeks. If any problems, please call the office and ask to speak with a nurse.  Biopsy Wound Care Instructions  Leave the original bandage on for 24 hours if possible.  If the bandage becomes soaked or soiled before that time, it is OK to remove it and examine the wound.  A small amount of post-operative bleeding is normal.  If excessive bleeding occurs, remove the bandage, place gauze over the site and apply continuous pressure (no peeking) over the area for 30 minutes. If this does not work, please call our clinic as soon as possible or page your doctor if it is after hours.   Once a day, cleanse the wound with soap and water. It is fine to shower. If a thick crust develops you may use a Q-tip dipped into dilute hydrogen peroxide (mix 1:1 with water) to dissolve it.  Hydrogen peroxide can slow the healing process, so use it only as needed.    After washing, apply petroleum jelly (Vaseline) or an antibiotic ointment if your doctor prescribed one for you,  followed by a bandage.    For best healing, the wound should be covered with a layer of ointment at all times. If you are not able to keep the area covered with a bandage to hold the ointment in place, this may mean re-applying the ointment several times a day.  Continue this wound care until the wound has healed and is no longer open.   Itching and mild discomfort is normal during the healing process. However, if you develop pain or severe itching, please call our office.   If you have any discomfort, you can take Tylenol (acetaminophen) or ibuprofen as directed on the bottle. (Please do not take these if you have an allergy to them or cannot take them for another reason).  Some redness, tenderness and Cortavious Nix or yellow material in the wound is normal healing.  If the area becomes very sore and red, or develops a thick yellow-green material (pus), it may be infected; please notify us.    If you have stitches, return to clinic as directed to have the stitches removed. You will continue wound care for 2-3 days after the stitches are removed.   Wound healing continues for up to one year following surgery. It is not unusual to experience pain in the scar from time to time during the interval.  If the pain becomes severe or  the scar thickens, you should notify the office.    A slight amount of redness in a scar is expected for the first six months.  After six months, the redness will fade and the scar will soften and fade.  The color difference becomes less noticeable with time.  If there are any problems, return for a post-op surgery check at your earliest convenience.  To improve the appearance of the scar, you can use silicone scar gel, cream, or sheets (such as Mederma or Serica) every night for up to one year. These are available over the counter (without a prescription).  Please call our office at [phone] for any questions or concerns.   Electrodesiccation and Curettage ("Scrape and Burn")  Wound Care Instructions  Leave the original bandage on for 24 hours if possible.  If the bandage becomes soaked or soiled before that time, it is OK to remove it and examine the wound.  A small amount of post-operative bleeding is normal.  If excessive bleeding occurs, remove the bandage, place gauze over the site and apply continuous pressure (no peeking) over the area for 30 minutes. If this does not work, please call our clinic as soon as possible or page your doctor if it is after hours.   Once a day, cleanse the wound with soap and water. It is fine to shower. If a thick crust develops you may use a Q-tip dipped into dilute hydrogen peroxide (mix 1:1 with water) to dissolve it.  Hydrogen peroxide can slow the healing process, so use it only as needed.    After washing, apply petroleum jelly (Vaseline) or an antibiotic ointment if your doctor prescribed one for you, followed by a bandage.    For best healing, the wound should be covered with a layer of ointment at all times. If you are not able to keep the area covered with a bandage to hold the ointment in place, this may mean re-applying the ointment several times a day.  Continue this wound care until the wound has healed and is no longer open. It may take several weeks for the wound to heal and close.  Itching and mild discomfort is normal during the healing process.  If you have any discomfort, you can take Tylenol (acetaminophen) or ibuprofen as directed on the bottle. (Please do not take these if you have an allergy to them or cannot take them for another reason).  Some redness, tenderness and Sharon Rubis or yellow material in the wound is normal healing.  If the area becomes very sore and red, or develops a thick yellow-green material (pus), it may be infected; please notify us.    Wound healing continues for up to one year following surgery. It is not unusual to experience pain in the scar from time to time during the interval.  If the pain  becomes severe or the scar thickens, you should notify the office.    A slight amount of redness in a scar is expected for the first six months.  After six months, the redness will fade and the scar will soften and fade.  The color difference becomes less noticeable with time.  If there are any problems, return for a post-op surgery check at your earliest convenience.  To improve the appearance of the scar, you can use silicone scar gel, cream, or sheets (such as Mederma or Serica) every night for up to one year. These are available over the counter (without a prescription).  Please call our  office at [phone] for any questions or concerns.     Due to recent changes in healthcare laws, you may see results of your pathology and/or laboratory studies on MyChart before the doctors have had a chance to review them. We understand that in some cases there may be results that are confusing or concerning to you. Please understand that not all results are received at the same time and often the doctors may need to interpret multiple results in order to provide you with the best plan of care or course of treatment. Therefore, we ask that you please give [place] 2 business days to thoroughly review all your results before contacting the office for clarification. Should we see a critical lab result, you will be contacted sooner.   If You Need Anything After Your Visit  If you have any questions or concerns for your doctor, please call our main line at [phone] and press option 4 to reach your doctor's medical assistant. If no one answers, please leave a voicemail as directed and we will return your call as soon as possible. Messages left after 4 pm will be answered the following business day.   You may also send [place] a message via MyChart. We typically respond to MyChart messages within 1-2 business days.  For prescription refills, please ask your pharmacy to contact our office. Our fax number is  [phone].  If you have an urgent issue when the clinic is closed that cannot wait until the next business day, you can page your doctor at the number below.    Please note that while we do our best to be available for urgent issues outside of office hours, we are not available 24/7.   If you have an urgent issue and are unable to reach [place], you may choose to seek medical care at your doctor's office, retail clinic, urgent care center, or emergency room.  If you have a medical emergency, please immediately call 911 or go to the emergency department.  Pager Numbers  - Dr. Gwen Pounds: [phone]  - Dr. Roseanne Reno: [phone]  - Dr. Katrinka Blazing: [phone]   In the event of inclement weather, please call our main line at [phone] for an update on the status of any delays or closures.  Dermatology Medication Tips: Please keep the boxes that topical medications come in in order to help keep track of the instructions about where and how to use these. Pharmacies typically print the medication instructions only on the boxes and not directly on the medication tubes.   If your medication is too expensive, please contact our office at [phone] option 4 or send [place] a message through MyChart.   We are unable to tell what your co-pay for medications will be in advance as this is different depending on your insurance coverage. However, we may be able to find a substitute medication at lower cost or fill out paperwork to get insurance to cover a needed medication.   If a prior authorization is required to get your medication covered by your insurance company, please allow [place] 1-2 business days to complete this process.  Drug prices often vary depending on where the prescription is filled and some pharmacies may offer cheaper prices.  The website www.goodrx.com contains coupons for medications through different pharmacies. The prices here do not account for what the cost may be with help from  insurance (it may be cheaper with your insurance), but the website can give you the price if you did not use  any insurance.  - You can print the associated coupon and take it with your prescription to the pharmacy.  - You may also stop by our office during regular business hours and pick up a GoodRx coupon card.  - If you need your prescription sent electronically to a different pharmacy, notify our office through [facility] or by phone at [phone] option 4.     Si Usted Necesita Algo Despus de Su Visita  Tambin puede enviarnos un mensaje a travs de Clinical cytogeneticist. Por lo general respondemos a los mensajes de MyChart en el transcurso de 1 a 2 das hbiles.  Para renovar recetas, por favor pida a su farmacia que se ponga en contacto con nuestra oficina. Annie Sable de fax es [place] [phone].  Si tiene un asunto urgente cuando la clnica est cerrada y que no puede esperar hasta el siguiente da hbil, puede llamar/localizar a su doctor(a) al nmero que aparece a continuacin.   Por favor, tenga en cuenta que aunque hacemos todo lo posible para estar disponibles para asuntos urgentes fuera del horario de [place], no estamos disponibles las 24 horas del da, los 7 [address]  [address] de la [place].   Si tiene un problema urgente y no puede comunicarse con nosotros, puede optar por buscar atencin mdica  en el consultorio de su doctor(a), en una clnica privada, en un centro de atencin urgente o en una sala de emergencias.  Si tiene Engineer, drilling, por favor llame inmediatamente al 911 o vaya a la sala de emergencias.  Nmeros de bper  - Dr. Gwen Pounds: [phone]  - Dra. Roseanne Reno: [phone]  - Dr. Katrinka Blazing: [phone]   En caso de inclemencias del tiempo, por favor llame a Lacy Duverney principal al [phone] para una actualizacin sobre el [place] de cualquier retraso o cierre.  Consejos para la medicacin en dermatologa: Por favor, guarde las cajas en las que vienen los  medicamentos de uso tpico para ayudarle a seguir las instrucciones sobre dnde y cmo usarlos. Las farmacias generalmente imprimen las instrucciones del medicamento slo en las cajas y no directamente en los tubos del [place].   Si su medicamento es muy caro, por favor, pngase en contacto con Rolm Gala llamando al [phone] y presione la opcin 4 o envenos un mensaje a travs de Clinical cytogeneticist.   No podemos decirle cul ser su copago por los medicamentos por adelantado ya que esto es diferente dependiendo de la cobertura de su seguro. Sin embargo, es posible que podamos encontrar un medicamento sustituto a Audiological scientist un formulario para que el seguro cubra el medicamento que se considera necesario.   Si se requiere una autorizacin previa para que su compaa de seguros [place] su medicamento, por favor permtanos de 1 a 2 das hbiles para completar [address]  Los precios de los medicamentos varan con frecuencia dependiendo del Environmental consultant de dnde se surte la receta y alguna farmacias pueden ofrecer precios ms baratos.  El sitio web www.goodrx.com tiene cupones para medicamentos de Health and safety inspector. Los precios aqu no tienen en cuenta lo que podra costar con la ayuda del seguro (puede ser ms barato con su seguro), pero el sitio web puede darle el precio si no utiliz Tourist information centre manager.  - Puede imprimir el cupn correspondiente y llevarlo con su receta a la farmacia.  - Tambin puede pasar por nuestra oficina durante el horario de atencin regular y Education officer, museum una tarjeta de cupones de GoodRx.  - Si necesita que su receta se enve electrnicamente a  una farmacia diferente, informe a nuestra oficina a travs de MyChart de [place] o por telfono llamando al [phone] y presione la opcin 4.

## 2023-07-05 ENCOUNTER — Encounter: Payer: Self-pay | Admitting: Dermatology

## 2023-07-09 ENCOUNTER — Telehealth: Payer: Self-pay

## 2023-07-09 NOTE — Telephone Encounter (Signed)
Patient informed of pathology results 

## 2023-07-09 NOTE — Telephone Encounter (Signed)
-----   Message from [name] sent at 07/09/2023  1:55 PM EDT ----- Diagnosis 1. Skin , left medial calf WELL DIFFERENTIATED SQUAMOUS CELL CARCINOMA 2. Skin , right medial calf WELL DIFFERENTIATED SQUAMOUS CELL CARCINOMA  1&2 - both cancer = SCC Both already treated Recheck next visit

## 2023-07-09 NOTE — Telephone Encounter (Signed)
Left pt msg to call for bx results/sh 

## 2023-07-17 ENCOUNTER — Telehealth: Payer: Self-pay

## 2023-07-17 NOTE — Telephone Encounter (Signed)
I was unable to speak with pt and left v/m requesting pt to call [facility] with update on condition  sending note to Dr Epimenio Foot pool and lsc triage.

## 2023-07-18 NOTE — Telephone Encounter (Signed)
Called patient reviewed all information and repeated back to me. Will call if any questions.  ? ?

## 2023-07-18 NOTE — Telephone Encounter (Signed)
Unable to reach pt by phone and left v/m requesting cb from pt [phone] for update on condition. Sending note to [place] pool and  lsc triage.

## 2023-07-18 NOTE — Telephone Encounter (Signed)
Sending note  as FYI to Dr Alphonsus Sias who is out of office and Dr Ermalene Searing who is in office.

## 2023-07-18 NOTE — Telephone Encounter (Signed)
As long as his symptoms are better--no further action is needed. Might have had some concentrated urine also if not drinking enough

## 2023-07-22 ENCOUNTER — Other Ambulatory Visit: Payer: Self-pay | Admitting: Internal Medicine

## 2023-07-22 MED ORDER — TRAMADOL HCL 50 MG PO TABS: 50.0000 mg | ORAL_TABLET | Freq: Three times a day (TID) | ORAL | 0 refills | Status: DC | PRN

## 2023-07-22 NOTE — Telephone Encounter (Signed)
Last filled 05-08-23 #60 Last OV 06-26-23 Next OV 07-01-24 [facility] [address] [address]

## 2023-07-22 NOTE — Telephone Encounter (Signed)
Prescription Request  07/22/2023  LOV: 06/26/2023  What is the name of the medication or equipment? traMADol (ULTRAM) 50 MG tablet   Have you contacted your pharmacy to request a refill? Yes   Which pharmacy would you like this sent to?  [id number] [place], [place] - [address] [place] [address] Phone: [phone] Fax: [phone]     Patient notified that their request is being sent to the clinical staff for review and that they should receive a response within 2 business days.   Please advise at Mobile [phone] (mobile)

## 2023-07-31 ENCOUNTER — Encounter: Payer: Medicare Other | Admitting: Internal Medicine

## 2023-08-26 ENCOUNTER — Ambulatory Visit: Payer: Medicare Other | Admitting: Dermatology

## 2023-08-26 DIAGNOSIS — L820 Inflamed seborrheic keratosis: Secondary | ICD-10-CM | POA: Diagnosis not present

## 2023-08-26 DIAGNOSIS — C44722 Squamous cell carcinoma of skin of right lower limb, including hip: Secondary | ICD-10-CM | POA: Diagnosis not present

## 2023-08-26 DIAGNOSIS — L570 Actinic keratosis: Secondary | ICD-10-CM | POA: Diagnosis not present

## 2023-08-26 DIAGNOSIS — L578 Other skin changes due to chronic exposure to nonionizing radiation: Secondary | ICD-10-CM | POA: Diagnosis not present

## 2023-08-26 DIAGNOSIS — W098XXA Fall on or from other playground equipment, initial encounter: Secondary | ICD-10-CM | POA: Diagnosis not present

## 2023-08-26 NOTE — Patient Instructions (Addendum)
Pre-Operative Instructions  You are scheduled for a surgical procedure at [facility]. We recommend you read the following instructions. If you have any questions or concerns, please call the office at [phone].  Shower and wash the entire body with soap and water the day of your surgery paying special attention to cleansing at and around the planned surgery site.  Avoid aspirin or aspirin containing products at least fourteen (14) days prior to your surgical procedure and for at least one week (7 Days) after your surgical procedure. If you take aspirin on a regular basis for heart disease or history of stroke or for any other reason, we may recommend you continue taking aspirin but please notify us if you take this on a regular basis. Aspirin can cause more bleeding to occur during surgery as well as prolonged bleeding and bruising after surgery.   Avoid other nonsteroidal pain medications at least one week prior to surgery and at least one week prior to your surgery. These include medications such as Ibuprofen (Motrin, Advil and Nuprin), Naprosyn, Voltaren, Relafen, etc. If medications are used for therapeutic reasons, please inform us as they can cause increased bleeding or prolonged bleeding during and bruising after surgical procedures.   Please advise [place] if you are taking any "blood thinner" medications such as Coumadin or Dipyridamole or Plavix or similar medications. These cause increased bleeding and prolonged bleeding during procedures and bruising after surgical procedures. We may have to consider discontinuing these medications briefly prior to and shortly after your surgery if safe to do so.   Please inform us of all medications you are currently taking. All medications that are taken regularly should be taken the day of surgery as you always do. Nevertheless, we need to be informed of what medications you are taking prior to surgery to know whether they will affect the  procedure or cause any complications.   Please inform us of any medication allergies. Also inform us of whether you have allergies to Latex or rubber products or whether you have had any adverse reaction to Lidocaine or Epinephrine.  Please inform us of any prosthetic or artificial body parts such as artificial heart valve, joint replacements, etc., or similar condition that might require preoperative antibiotics.   We recommend avoidance of alcohol at least two weeks prior to surgery and continued avoidance for at least two weeks after surgery.   We recommend discontinuation of tobacco smoking at least two weeks prior to surgery and continued abstinence for at least two weeks after surgery.  Do not plan strenuous exercise, strenuous work or strenuous lifting for approximately four weeks after your surgery.   We request if you are unable to make your scheduled surgical appointment, please call us at least a week in advance or as soon as you are aware of a problem so that we can cancel or reschedule the appointment.   You MAY TAKE TYLENOL (acetaminophen) for pain as it is not a blood thinner.   PLEASE PLAN TO BE IN TOWN FOR TWO WEEKS FOLLOWING SURGERY, THIS IS IMPORTANT SO YOU CAN BE CHECKED FOR DRESSING CHANGES, SUTURE REMOVAL AND TO MONITOR FOR POSSIBLE COMPLICATIONS.    Cryotherapy Aftercare  Wash gently with soap and water everyday.   Apply Vaseline and Band-Aid daily until healed.        Due to recent changes in healthcare laws, you may see results of your pathology and/or laboratory studies on MyChart before the doctors have had a chance to  review them. We understand that in some cases there may be results that are confusing or concerning to you. Please understand that not all results are received at the same time and often the doctors may need to interpret multiple results in order to provide you with the best plan of care or course of treatment. Therefore, we ask that you please  give [place] 2 business days to thoroughly review all your results before contacting the office for clarification. Should we see a critical lab result, you will be contacted sooner.   If You Need Anything After Your Visit  If you have any questions or concerns for your doctor, please call our main line at [phone] and press option 4 to reach your doctor's medical assistant. If no one answers, please leave a voicemail as directed and we will return your call as soon as possible. Messages left after 4 pm will be answered the following business day.   You may also send [place] a message via MyChart. We typically respond to MyChart messages within 1-2 business days.  For prescription refills, please ask your pharmacy to contact our office. Our fax number is [phone].  If you have an urgent issue when the clinic is closed that cannot wait until the next business day, you can page your doctor at the number below.    Please note that while we do our best to be available for urgent issues outside of office hours, we are not available 24/7.   If you have an urgent issue and are unable to reach [place], you may choose to seek medical care at your doctor's office, retail clinic, urgent care center, or emergency room.  If you have a medical emergency, please immediately call 911 or go to the emergency department.  Pager Numbers  - Dr. Gwen Pounds: [phone]  - Dr. Roseanne Reno: [phone]  - Dr. Katrinka Blazing: [phone]   In the event of inclement weather, please call our main line at [phone] for an update on the status of any delays or closures.  Dermatology Medication Tips: Please keep the boxes that topical medications come in in order to help keep track of the instructions about where and how to use these. Pharmacies typically print the medication instructions only on the boxes and not directly on the medication tubes.   If your medication is too expensive, please contact our office at [phone]  option 4 or send [place] a message through MyChart.   We are unable to tell what your co-pay for medications will be in advance as this is different depending on your insurance coverage. However, we may be able to find a substitute medication at lower cost or fill out paperwork to get insurance to cover a needed medication.   If a prior authorization is required to get your medication covered by your insurance company, please allow [place] 1-2 business days to complete this process.  Drug prices often vary depending on where the prescription is filled and some pharmacies may offer cheaper prices.  The website www.goodrx.com contains coupons for medications through different pharmacies. The prices here do not account for what the cost may be with help from insurance (it may be cheaper with your insurance), but the website can give you the price if you did not use any insurance.  - You can print the associated coupon and take it with your prescription to the pharmacy.  - You may also stop by our office during regular business hours and pick up a GoodRx coupon card.  -  If you need your prescription sent electronically to a different pharmacy, notify our office through [facility] or by phone at [phone] option 4.     Si Usted Necesita Algo Despus de Su Visita  Tambin puede enviarnos un mensaje a travs de Clinical cytogeneticist. Por lo general respondemos a los mensajes de MyChart en el transcurso de 1 a 2 das hbiles.  Para renovar recetas, por favor pida a su farmacia que se ponga en contacto con nuestra oficina. Annie Sable de fax es [place] [phone].  Si tiene un asunto urgente cuando la clnica est cerrada y que no puede esperar hasta el siguiente da hbil, puede llamar/localizar a su doctor(a) al nmero que aparece a continuacin.   Por favor, tenga en cuenta que aunque hacemos todo lo posible para estar disponibles para asuntos urgentes fuera del horario de [place], no estamos disponibles las  24 horas del da, los 7 [address]  [address] de la [place].   Si tiene un problema urgente y no puede comunicarse con nosotros, puede optar por buscar atencin mdica  en el consultorio de su doctor(a), en una clnica privada, en un centro de atencin urgente o en una sala de emergencias.  Si tiene Engineer, drilling, por favor llame inmediatamente al 911 o vaya a la sala de emergencias.  Nmeros de bper  - Dr. Gwen Pounds: [phone]  - Dra. Roseanne Reno: [phone]  - Dr. Katrinka Blazing: [phone]   En caso de inclemencias del tiempo, por favor llame a Lacy Duverney principal al [phone] para una actualizacin sobre el [place] de cualquier retraso o cierre.  Consejos para la medicacin en dermatologa: Por favor, guarde las cajas en las que vienen los medicamentos de uso tpico para ayudarle a seguir las instrucciones sobre dnde y cmo usarlos. Las farmacias generalmente imprimen las instrucciones del medicamento slo en las cajas y no directamente en los tubos del [place].   Si su medicamento es muy caro, por favor, pngase en contacto con Rolm Gala llamando al [phone] y presione la opcin 4 o envenos un mensaje a travs de Clinical cytogeneticist.   No podemos decirle cul ser su copago por los medicamentos por adelantado ya que esto es diferente dependiendo de la cobertura de su seguro. Sin embargo, es posible que podamos encontrar un medicamento sustituto a Audiological scientist un formulario para que el seguro cubra el medicamento que se considera necesario.   Si se requiere una autorizacin previa para que su compaa de seguros [place] su medicamento, por favor permtanos de 1 a 2 das hbiles para completar [address]  Los precios de los medicamentos varan con frecuencia dependiendo del Environmental consultant de dnde se surte la receta y alguna farmacias pueden ofrecer precios ms baratos.  El sitio web www.goodrx.com tiene cupones para medicamentos de Health and safety inspector. Los precios aqu no tienen en cuenta  lo que podra costar con la ayuda del seguro (puede ser ms barato con su seguro), pero el sitio web puede darle el precio si no utiliz Tourist information centre manager.  - Puede imprimir el cupn correspondiente y llevarlo con su receta a la farmacia.  - Tambin puede pasar por nuestra oficina durante el horario de atencin regular y Education officer, museum una tarjeta de cupones de GoodRx.  - Si necesita que su receta se enve electrnicamente a una farmacia diferente, informe a nuestra oficina a travs de MyChart de [place] o por telfono llamando al [phone] y presione la opcin 4.

## 2023-08-26 NOTE — Progress Notes (Signed)
Follow-Up Visit   Subjective  Stephen Bennett. is a 82 y.o. male who presents for the following: R medial calf site of SCC bx and EDC is not clear pt using mupirocin oint, check spots face, R shoulder The patient has spots, moles and lesions to be evaluated, some may be new or changing and the patient may have concern these could be cancer.   The following portions of the chart were reviewed this encounter and updated as appropriate: medications, allergies, medical history  Review of Systems:  No other skin or systemic complaints except as noted in HPI or Assessment and Plan.  Objective  Well appearing patient in no apparent distress; mood and affect are within normal limits.   A focused examination was performed of the following areas: R calf, face, left shoulder  Relevant exam findings are noted in the Assessment and Plan.  L shoulder x 8 (8) Stuck on waxy paps with erythema  face x 10 (10) Pink scaly macules       Assessment & Plan   RECURRENT SCC R medial calf Exam: keratotic nodule Treatment Plan: Recommend excising -scheduled for surgery   Inflamed seborrheic keratosis (8) L shoulder x 8  Symptomatic, irritating, patient would like treated.  Destruction of lesion - L shoulder x 8 (8) Complexity: simple   Destruction method: cryotherapy   Informed consent: discussed and consent obtained   Timeout:  patient name, date of birth, surgical site, and procedure verified Lesion destroyed using liquid nitrogen: Yes   Region frozen until ice ball extended beyond lesion: Yes   Outcome: patient tolerated procedure well with no complications   Post-procedure details: wound care instructions given    AK (actinic keratosis) (10) face x 10  Actinic keratoses are precancerous spots that appear secondary to cumulative UV radiation exposure/sun exposure over time. They are chronic with expected duration over 1 year. A portion of actinic keratoses will progress to  squamous cell carcinoma of the skin. It is not possible to reliably predict which spots will progress to skin cancer and so treatment is recommended to prevent development of skin cancer.  Recommend daily broad spectrum sunscreen SPF 30+ to sun-exposed areas, reapply every 2 hours as needed.  Recommend staying in the shade or wearing long sleeves, sun glasses (UVA+UVB protection) and wide brim hats (4-inch brim around the entire circumference of the hat). Call for new or changing lesions.  Destruction of lesion - face x 10 (10) Complexity: simple   Destruction method: cryotherapy   Informed consent: discussed and consent obtained   Timeout:  patient name, date of birth, surgical site, and procedure verified Lesion destroyed using liquid nitrogen: Yes   Region frozen until ice ball extended beyond lesion: Yes   Outcome: patient tolerated procedure well with no complications   Post-procedure details: wound care instructions given    ACTINIC DAMAGE - chronic, secondary to cumulative UV radiation exposure/sun exposure over time - diffuse scaly erythematous macules with underlying dyspigmentation - Recommend daily broad spectrum sunscreen SPF 30+ to sun-exposed areas, reapply every 2 hours as needed.  - Recommend staying in the shade or wearing long sleeves, sun glasses (UVA+UVB protection) and wide brim hats (4-inch brim around the entire circumference of the hat). - Call for new or changing lesions.   Return in about 1 week (around 09/02/2023) for surgery 11:30 .  I, Ardis Rowan, RMA, am acting as scribe for [name], MD .   Documentation: I have reviewed the above documentation  for accuracy and completeness, and I agree with the above.  [name], MD

## 2023-09-02 ENCOUNTER — Other Ambulatory Visit: Payer: Self-pay | Admitting: Internal Medicine

## 2023-09-02 ENCOUNTER — Ambulatory Visit: Payer: Medicare Other | Admitting: Dermatology

## 2023-09-02 ENCOUNTER — Telehealth: Payer: Self-pay

## 2023-09-02 DIAGNOSIS — C44722 Squamous cell carcinoma of skin of right lower limb, including hip: Secondary | ICD-10-CM | POA: Diagnosis not present

## 2023-09-02 DIAGNOSIS — D485 Neoplasm of uncertain behavior of skin: Secondary | ICD-10-CM

## 2023-09-02 DIAGNOSIS — D492 Neoplasm of unspecified behavior of bone, soft tissue, and skin: Secondary | ICD-10-CM

## 2023-09-02 MED ORDER — MUPIROCIN 2 % EX OINT: TOPICAL_OINTMENT | CUTANEOUS | 0 refills | Status: DC

## 2023-09-02 MED ORDER — DOXYCYCLINE MONOHYDRATE 100 MG PO CAPS: ORAL_CAPSULE | ORAL | 0 refills | Status: DC

## 2023-09-02 NOTE — Telephone Encounter (Signed)
Last filled 07-22-23 #60 Last OV 06-26-23 Next OV 07-01-24 [facility] [address]

## 2023-09-02 NOTE — Telephone Encounter (Signed)
Patient doing well after today's surgery. Advised to contact our office if any concerns before suture removal appointment.

## 2023-09-02 NOTE — Progress Notes (Signed)
Follow-Up Visit   Subjective  Stephen Bennett. is a 82 y.o. male who presents for the following: Recurrent SCC bx proven of the R med calf. Patient here today for excision.   The following portions of the chart were reviewed this encounter and updated as appropriate: medications, allergies, medical history  Review of Systems:  No other skin or systemic complaints except as noted in HPI or Assessment and Plan.  Objective  Well appearing patient in no apparent distress; mood and affect are within normal limits.   A focused examination was performed of the following areas: the R lower leg   Relevant exam findings are noted in the Assessment and Plan.  R med calf 4.2 hyperkeratotic nodule.    Assessment & Plan     Neoplasm of uncertain behavior of skin R med calf  Skin excision  Lesion length (cm):  4.2 Lesion width (cm):  4.2 Margin per side (cm):  0.2 Total excision diameter (cm):  4.6 Informed consent: discussed and consent obtained   Timeout: patient name, date of birth, surgical site, and procedure verified   Procedure prep:  Patient was prepped and draped in usual sterile fashion Prep type:  Isopropyl alcohol and povidone-iodine Anesthesia: the lesion was anesthetized in a standard fashion   Anesthetic:  1% lidocaine w/ epinephrine 1-100,000 buffered w/ 8.4% NaHCO3 (12 cc lidocaine, 6 cc bupivocaine) Instrument used: #15 blade   Hemostasis achieved with: pressure   Hemostasis achieved with comment:  Electrocautery Outcome: patient tolerated procedure well with no complications   Post-procedure details: sterile dressing applied and wound care instructions given   Dressing type: bandage and pressure dressing    Skin repair Complexity:  Complex Final length (cm):  6 Informed consent: discussed and consent obtained   Timeout: patient name, date of birth, surgical site, and procedure verified   Procedure prep:  Patient was prepped and draped in usual sterile  fashion Prep type:  Povidone-iodine Anesthesia: the lesion was anesthetized in a standard fashion   Reason for type of repair: reduce tension to allow closure, reduce the risk of dehiscence, infection, and necrosis, reduce subcutaneous dead space and avoid a hematoma, allow closure of the large defect, preserve normal anatomy, preserve normal anatomical and functional relationships and enhance both functionality and cosmetic results   Undermining comment:  Undermining defect 4.6 cm Subcutaneous layers (deep stitches):  Suture size:  3-0 and 2-0 Suture type: Vicryl (polyglactin 910)   Fine/surface layer approximation (top stitches):  Suture size:  3-0 and 2-0 Stitches: horizontal mattress   Suture removal (days):  7 Hemostasis achieved with: suture and pressure Outcome: patient tolerated procedure well with no complications   Post-procedure details: sterile dressing applied and wound care instructions given   Dressing type: bandage and pressure dressing (Mupirocin 2% ointment)    Specimen 1 - Surgical pathology Differential Diagnosis: D48.5 recurrent SCC  Accession: [id number] Check Margins: Yes  Start Mupirocin 2% ointment to aa's QD. Start Doxycycline 100 mg po BID x 5 days. Doxycycline should be taken with food to prevent nausea. Do not lay down for 30 minutes after taking. Be cautious with sun exposure and use good sun protection while on this medication. Pregnant women should not take this medication.      Return in about 1 week (around 09/09/2023) for suture removal.  I, Cari Caraway, CMA, am acting as scribe for [name], MD .   Documentation: I have reviewed the above documentation for accuracy and completeness, and I  agree with the above.  [name], MD

## 2023-09-02 NOTE — Patient Instructions (Signed)

## 2023-09-02 NOTE — Telephone Encounter (Signed)
Prescription Request  09/02/2023  LOV: 06/26/2023  What is the name of the medication or equipment? traMADol (ULTRAM) 50 MG tablet, had a cancer lump removed from his leg  Have you contacted your pharmacy to request a refill? No   Which pharmacy would you like this sent to?  [id number] [place], [place] - [address] [place] [address] Phone: [phone] Fax: [phone]  Patient notified that their request is being sent to the clinical staff for review and that they should receive a response within 2 business days.   Please advise at Mobile [phone] (mobile)

## 2023-09-03 MED ORDER — TRAMADOL HCL 50 MG PO TABS: 50.0000 mg | ORAL_TABLET | Freq: Three times a day (TID) | ORAL | 0 refills | Status: DC | PRN

## 2023-09-05 LAB — SURGICAL PATHOLOGY

## 2023-09-06 ENCOUNTER — Encounter: Payer: Self-pay | Admitting: Dermatology

## 2023-09-09 ENCOUNTER — Encounter: Payer: Self-pay | Admitting: Dermatology

## 2023-09-09 ENCOUNTER — Ambulatory Visit: Payer: Medicare Other | Admitting: Dermatology

## 2023-09-09 DIAGNOSIS — Z8589 Personal history of malignant neoplasm of other organs and systems: Secondary | ICD-10-CM

## 2023-09-09 MED ORDER — DOXYCYCLINE MONOHYDRATE 100 MG PO CAPS: 100.0000 mg | ORAL_CAPSULE | Freq: Two times a day (BID) | ORAL | 0 refills | Status: AC

## 2023-09-09 MED ORDER — HYDROCODONE-ACETAMINOPHEN 5-300 MG PO TABS: 1.0000 | ORAL_TABLET | Freq: Three times a day (TID) | ORAL | 0 refills | Status: DC

## 2023-09-09 NOTE — Patient Instructions (Signed)

## 2023-09-09 NOTE — Progress Notes (Signed)
   Follow-Up Visit   Subjective  Stephen Bennett. is a 82 y.o. male who presents for the following: SCC margins free bx proven, R medial calf, 1 wk s/p excision, pt having pain, pt finished Doxycycline x 5 days, Mupirocin oint qd/bid,  The patient has spots, moles and lesions to be evaluated, some may be new or changing and the patient may have concern these could be cancer.   The following portions of the chart were reviewed this encounter and updated as appropriate: medications, allergies, medical history  Review of Systems:  No other skin or systemic complaints except as noted in HPI or Assessment and Plan.  Objective  Well appearing patient in no apparent distress; mood and affect are within normal limits.   A focused examination was performed of the following areas: R lower leg  Relevant exam findings are noted in the Assessment and Plan.    Assessment & Plan   SCC Margins Free Bx  proven Exam: R medial calf healing excision site With edema  Treatment Plan: Re-Start Doxycycline 100mg  1 po bid with food and drink for 7 more days Start Vicodin 5-300mg  tabs 1 po tid prn pain, #10 0 rf, do not drive or operate heavy equipment while taking this medication, may make drowsy. (Pt given written script)  Encounter for Removal of Sutures - Incision site at the R medial calf is clean, dry and intact - Wound cleansed, 1 suture removed today, wound cleansed, we will remove central suture in 1 week.  - Discussed pathology results showing SCC margins free  - Patient advised to keep steri-strips dry until they fall off. - Scars remodel for a full year. - Patient advised to call with any concerns or if they notice any new or changing lesions.   Doxycycline should be taken with food to prevent nausea. Do not lay down for 30 minutes after taking. Be cautious with sun exposure and use good sun protection while on this medication. Pregnant women should not take this medication.       Return in about 1 week (around 09/16/2023) for suture removal,check spot on L leg.  I, Ardis Rowan, RMA, am acting as scribe for [name], MD .   Documentation: I have reviewed the above documentation for accuracy and completeness, and I agree with the above.  [name], MD

## 2023-09-16 ENCOUNTER — Encounter: Payer: Self-pay | Admitting: Dermatology

## 2023-09-16 ENCOUNTER — Ambulatory Visit: Payer: Medicare Other | Admitting: Dermatology

## 2023-09-16 DIAGNOSIS — D489 Neoplasm of uncertain behavior, unspecified: Secondary | ICD-10-CM

## 2023-09-16 DIAGNOSIS — C44729 Squamous cell carcinoma of skin of left lower limb, including hip: Secondary | ICD-10-CM

## 2023-09-16 DIAGNOSIS — C44722 Squamous cell carcinoma of skin of right lower limb, including hip: Secondary | ICD-10-CM

## 2023-09-16 DIAGNOSIS — L820 Inflamed seborrheic keratosis: Secondary | ICD-10-CM | POA: Diagnosis not present

## 2023-09-16 DIAGNOSIS — Z85828 Personal history of other malignant neoplasm of skin: Secondary | ICD-10-CM

## 2023-09-16 DIAGNOSIS — R609 Edema, unspecified: Secondary | ICD-10-CM

## 2023-09-16 DIAGNOSIS — D492 Neoplasm of unspecified behavior of bone, soft tissue, and skin: Secondary | ICD-10-CM

## 2023-09-16 MED ORDER — DOXYCYCLINE MONOHYDRATE 100 MG PO CAPS: 100.0000 mg | ORAL_CAPSULE | Freq: Two times a day (BID) | ORAL | 0 refills | Status: DC

## 2023-09-16 MED ORDER — MUPIROCIN 2 % EX OINT: TOPICAL_OINTMENT | CUTANEOUS | 0 refills | Status: DC

## 2023-09-16 NOTE — Progress Notes (Signed)
Follow-Up Visit   Subjective  Stephen Bennett. is a 82 y.o. male who presents for the following: Suture removal at right medial calf. Patient also reports a spot at left elbow and at left lower leg he would like checked.   Pathology showed Excision, Residual squamous cell carcinoma, Margins Free at Right medial calf    The following portions of the chart were reviewed this encounter and updated as appropriate: medications, allergies, medical history  Review of Systems:  No other skin or systemic complaints except as noted in HPI or Assessment and Plan.  Objective  Well appearing patient in no apparent distress; mood and affect are within normal limits.  Areas Examined: Right medial calf, left lower leg, left arm  Relevant physical exam findings are noted in the Assessment and Plan.  left lateral leg 2.2 cm hyperkeratotic papule          left elbow x 1 Erythematous stuck-on, waxy papule or plaque    Assessment & Plan   SCC Margins Free Bx  proven Exam: R medial calf healing excision site With edema   Treatment Plan: Continue Doxycycline 100mg  1 po bid with food and drink for a few more days, then drop down to taking 1 capsule by mouth daily.   Start mupirocin 2 % ointment apply topically to wound daily and cover.   Start Vicodin 5-300mg  tabs 1 po tid prn pain, #10 0 rf, do not drive or operate heavy equipment while taking this medication, may make drowsy. (Pt given written script)   Doxycycline should be taken with food to prevent nausea. Do not lay down for 30 minutes after taking. Be cautious with sun exposure and use good sun protection while on this medication. Pregnant women should not take this medication.   SCC (squamous cell carcinoma), leg, right  Related Medications doxycycline (MONODOX) 100 MG capsule Take 1 capsule (100 mg total) by mouth 2 (two) times daily.  mupirocin ointment (BACTROBAN) 2 % Apply to healing wound QD.  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  Epidermal / dermal shaving  Lesion diameter (cm):  2.2 Informed consent: discussed and consent obtained   Timeout: patient name, date of birth, surgical site, and procedure verified   Procedure prep:  Patient was prepped and draped in usual sterile fashion Prep type:  Isopropyl alcohol Anesthesia: the lesion was anesthetized in a standard fashion   Anesthetic:  1% lidocaine w/ epinephrine 1-100,000 buffered w/ 8.4% NaHCO3 Instrument used: flexible razor blade   Hemostasis achieved with: pressure, aluminum chloride and electrodesiccation   Outcome: patient tolerated procedure well   Post-procedure details: sterile dressing applied and wound care instructions given   Dressing type: bandage and petrolatum    Destruction of lesion Complexity: extensive   Destruction method: electrodesiccation and curettage   Informed consent: discussed and consent obtained   Timeout:  patient name, date of birth, surgical site, and procedure verified Procedure prep:  Patient was prepped and draped in usual sterile fashion Prep type:  Isopropyl alcohol Anesthesia: the lesion was anesthetized in a standard fashion   Anesthetic:  1% lidocaine w/ epinephrine 1-100,000 buffered w/ 8.4% NaHCO3 Curettage performed in three different directions: Yes   Electrodesiccation performed over the curetted area: Yes   Lesion length (cm):  2.2 Lesion width (cm):  2.2 Margin per side (cm):  0.2 Final wound size (cm):  2.6 Hemostasis achieved with:  pressure, aluminum chloride and electrodesiccation Outcome: patient tolerated procedure well with no complications  Post-procedure details: sterile dressing applied and wound care instructions given   Dressing type: bandage and petrolatum    Specimen 1 - Surgical pathology Differential Diagnosis: r/o scc   Check Margins: No  R/o scc   Shv removal and ED&C  Inflamed seborrheic keratosis left elbow x 1  Symptomatic,  irritating, patient would like treated.  Destruction of lesion - left elbow x 1 Complexity: simple   Destruction method: cryotherapy   Informed consent: discussed and consent obtained   Timeout:  patient name, date of birth, surgical site, and procedure verified Lesion destroyed using liquid nitrogen: Yes   Region frozen until ice ball extended beyond lesion: Yes   Outcome: patient tolerated procedure well with no complications   Post-procedure details: wound care instructions given     Encounter for Removal of Sutures - Incision site is clean, dry and intact. - Wound cleansed, sutures removed, wound cleansed and steri strips applied.  - Discussed pathology results showing FINAL DIAGNOSIS        1. Skin (M), R med calf :      EXCISION, RESIDUAL SQUAMOUS CELL CARCINOMA, MARGINS FREE   Cancer = SCC Margins clear - Patient advised to keep steri-strips dry until they fall off. - Scars remodel for a full year. - Once steri-strips fall off, patient can apply over-the-counter silicone scar cream once to twice a day to help with scar remodeling if desired. - Patient advised to call with any concerns or if they notice any new or changing lesions.  Return for 2 week recheck .  IAsher Muir, CMA, am acting as scribe for [name], MD.   Documentation: I have reviewed the above documentation for accuracy and completeness, and I agree with the above.  [name], MD

## 2023-09-16 NOTE — Patient Instructions (Addendum)
Electrodesiccation and Curettage ("Scrape and Burn") Wound Care Instructions  Leave the original bandage on for 24 hours if possible.  If the bandage becomes soaked or soiled before that time, it is OK to remove it and examine the wound.  A small amount of post-operative bleeding is normal.  If excessive bleeding occurs, remove the bandage, place gauze over the site and apply continuous pressure (no peeking) over the area for 30 minutes. If this does not work, please call our clinic as soon as possible or page your doctor if it is after hours.   Once a day, cleanse the wound with soap and water. It is fine to shower. If a thick crust develops you may use a Q-tip dipped into dilute hydrogen peroxide (mix 1:1 with water) to dissolve it.  Hydrogen peroxide can slow the healing process, so use it only as needed.    After washing, apply petroleum jelly (Vaseline) or an antibiotic ointment if your doctor prescribed one for you, followed by a bandage.    For best healing, the wound should be covered with a layer of ointment at all times. If you are not able to keep the area covered with a bandage to hold the ointment in place, this may mean re-applying the ointment several times a day.  Continue this wound care until the wound has healed and is no longer open. It may take several weeks for the wound to heal and close.  Itching and mild discomfort is normal during the healing process.  If you have any discomfort, you can take Tylenol (acetaminophen) or ibuprofen as directed on the bottle. (Please do not take these if you have an allergy to them or cannot take them for another reason).  Some redness, tenderness and white or yellow material in the wound is normal healing.  If the area becomes very sore and red, or develops a thick yellow-green material (pus), it may be infected; please notify us.    Wound healing continues for up to one year following surgery. It is not unusual to experience pain in the scar  from time to time during the interval.  If the pain becomes severe or the scar thickens, you should notify the office.    A slight amount of redness in a scar is expected for the first six months.  After six months, the redness will fade and the scar will soften and fade.  The color difference becomes less noticeable with time.  If there are any problems, return for a post-op surgery check at your earliest convenience.  To improve the appearance of the scar, you can use silicone scar gel, cream, or sheets (such as Mederma or Serica) every night for up to one year. These are available over the counter (without a prescription).  Please call our office at [phone] for any questions or concerns.   Biopsy Wound Care Instructions  Leave the original bandage on for 24 hours if possible.  If the bandage becomes soaked or soiled before that time, it is OK to remove it and examine the wound.  A small amount of post-operative bleeding is normal.  If excessive bleeding occurs, remove the bandage, place gauze over the site and apply continuous pressure (no peeking) over the area for 30 minutes. If this does not work, please call our clinic as soon as possible or page your doctor if it is after hours.   Once a day, cleanse the wound with soap and water. It is fine to shower.  If a thick crust develops you may use a Q-tip dipped into dilute hydrogen peroxide (mix 1:1 with water) to dissolve it.  Hydrogen peroxide can slow the healing process, so use it only as needed.    After washing, apply petroleum jelly (Vaseline) or an antibiotic ointment if your doctor prescribed one for you, followed by a bandage.    For best healing, the wound should be covered with a layer of ointment at all times. If you are not able to keep the area covered with a bandage to hold the ointment in place, this may mean re-applying the ointment several times a day.  Continue this wound care until the wound has healed and is no longer  open.   Itching and mild discomfort is normal during the healing process. However, if you develop pain or severe itching, please call our office.   If you have any discomfort, you can take Tylenol (acetaminophen) or ibuprofen as directed on the bottle. (Please do not take these if you have an allergy to them or cannot take them for another reason).  Some redness, tenderness and white or yellow material in the wound is normal healing.  If the area becomes very sore and red, or develops a thick yellow-green material (pus), it may be infected; please notify us.    If you have stitches, return to clinic as directed to have the stitches removed. You will continue wound care for 2-3 days after the stitches are removed.   Wound healing continues for up to one year following surgery. It is not unusual to experience pain in the scar from time to time during the interval.  If the pain becomes severe or the scar thickens, you should notify the office.    A slight amount of redness in a scar is expected for the first six months.  After six months, the redness will fade and the scar will soften and fade.  The color difference becomes less noticeable with time.  If there are any problems, return for a post-op surgery check at your earliest convenience.  To improve the appearance of the scar, you can use silicone scar gel, cream, or sheets (such as Mederma or Serica) every night for up to one year. These are available over the counter (without a prescription).  Please call our office at [phone] for any questions or concerns.       Due to recent changes in healthcare laws, you may see results of your pathology and/or laboratory studies on MyChart before the doctors have had a chance to review them. We understand that in some cases there may be results that are confusing or concerning to you. Please understand that not all results are received at the same time and often the doctors may need to interpret  multiple results in order to provide you with the best plan of care or course of treatment. Therefore, we ask that you please give [place] 2 business days to thoroughly review all your results before contacting the office for clarification. Should we see a critical lab result, you will be contacted sooner.   If You Need Anything After Your Visit  If you have any questions or concerns for your doctor, please call our main line at [phone] and press option 4 to reach your doctor's medical assistant. If no one answers, please leave a voicemail as directed and we will return your call as soon as possible. Messages left after 4 pm will be answered the following business day.  You may also send [place] a message via MyChart. We typically respond to MyChart messages within 1-2 business days.  For prescription refills, please ask your pharmacy to contact our office. Our fax number is [phone].  If you have an urgent issue when the clinic is closed that cannot wait until the next business day, you can page your doctor at the number below.    Please note that while we do our best to be available for urgent issues outside of office hours, we are not available 24/7.   If you have an urgent issue and are unable to reach [place], you may choose to seek medical care at your doctor's office, retail clinic, urgent care center, or emergency room.  If you have a medical emergency, please immediately call 911 or go to the emergency department.  Pager Numbers  - Dr. Gwen Pounds: [phone]  - Dr. Roseanne Reno: [phone]  - Dr. Katrinka Blazing: [phone]   In the event of inclement weather, please call our main line at [phone] for an update on the status of any delays or closures.  Dermatology Medication Tips: Please keep the boxes that topical medications come in in order to help keep track of the instructions about where and how to use these. Pharmacies typically print the medication instructions only on the boxes  and not directly on the medication tubes.   If your medication is too expensive, please contact our office at [phone] option 4 or send [place] a message through MyChart.   We are unable to tell what your co-pay for medications will be in advance as this is different depending on your insurance coverage. However, we may be able to find a substitute medication at lower cost or fill out paperwork to get insurance to cover a needed medication.   If a prior authorization is required to get your medication covered by your insurance company, please allow [place] 1-2 business days to complete this process.  Drug prices often vary depending on where the prescription is filled and some pharmacies may offer cheaper prices.  The website www.goodrx.com contains coupons for medications through different pharmacies. The prices here do not account for what the cost may be with help from insurance (it may be cheaper with your insurance), but the website can give you the price if you did not use any insurance.  - You can print the associated coupon and take it with your prescription to the pharmacy.  - You may also stop by our office during regular business hours and pick up a GoodRx coupon card.  - If you need your prescription sent electronically to a different pharmacy, notify our office through [facility] or by phone at [phone] option 4.     Si Usted Necesita Algo Despus de Su Visita  Tambin puede enviarnos un mensaje a travs de Clinical cytogeneticist. Por lo general respondemos a los mensajes de MyChart en el transcurso de 1 a 2 das hbiles.  Para renovar recetas, por favor pida a su farmacia que se ponga en contacto con nuestra oficina. Annie Sable de fax es [place] [phone].  Si tiene un asunto urgente cuando la clnica est cerrada y que no puede esperar hasta el siguiente da hbil, puede llamar/localizar a su doctor(a) al nmero que aparece a continuacin.   Por favor, tenga en cuenta que aunque  hacemos todo lo posible para estar disponibles para asuntos urgentes fuera del horario de [place], no estamos disponibles las 24 horas del da, los 7 [address]  [address] de la [place].  Si tiene un problema urgente y no puede comunicarse con nosotros, puede optar por buscar atencin mdica  en el consultorio de su doctor(a), en una clnica privada, en un centro de atencin urgente o en una sala de emergencias.  Si tiene Engineer, drilling, por favor llame inmediatamente al 911 o vaya a la sala de emergencias.  Nmeros de bper  - Dr. Gwen Pounds: [phone]  - Dra. Roseanne Reno: [phone]  - Dr. Katrinka Blazing: [phone]   En caso de inclemencias del tiempo, por favor llame a Lacy Duverney principal al [phone] para una actualizacin sobre el [place] de cualquier retraso o cierre.  Consejos para la medicacin en dermatologa: Por favor, guarde las cajas en las que vienen los medicamentos de uso tpico para ayudarle a seguir las instrucciones sobre dnde y cmo usarlos. Las farmacias generalmente imprimen las instrucciones del medicamento slo en las cajas y no directamente en los tubos del [place].   Si su medicamento es muy caro, por favor, pngase en contacto con Rolm Gala llamando al [phone] y presione la opcin 4 o envenos un mensaje a travs de Clinical cytogeneticist.   No podemos decirle cul ser su copago por los medicamentos por adelantado ya que esto es diferente dependiendo de la cobertura de su seguro. Sin embargo, es posible que podamos encontrar un medicamento sustituto a Audiological scientist un formulario para que el seguro cubra el medicamento que se considera necesario.   Si se requiere una autorizacin previa para que su compaa de seguros [place] su medicamento, por favor permtanos de 1 a 2 das hbiles para completar [address]  Los precios de los medicamentos varan con frecuencia dependiendo del Environmental consultant de dnde se surte la receta y alguna farmacias pueden ofrecer precios ms  baratos.  El sitio web www.goodrx.com tiene cupones para medicamentos de Health and safety inspector. Los precios aqu no tienen en cuenta lo que podra costar con la ayuda del seguro (puede ser ms barato con su seguro), pero el sitio web puede darle el precio si no utiliz Tourist information centre manager.  - Puede imprimir el cupn correspondiente y llevarlo con su receta a la farmacia.  - Tambin puede pasar por nuestra oficina durante el horario de atencin regular y Education officer, museum una tarjeta de cupones de GoodRx.  - Si necesita que su receta se enve electrnicamente a una farmacia diferente, informe a nuestra oficina a travs de MyChart de [place] o por telfono llamando al [phone] y presione la opcin 4.

## 2023-09-18 DIAGNOSIS — C4492 Squamous cell carcinoma of skin, unspecified: Secondary | ICD-10-CM

## 2023-09-18 HISTORY — DX: Squamous cell carcinoma of skin, unspecified: C44.92

## 2023-09-19 LAB — SURGICAL PATHOLOGY

## 2023-09-21 ENCOUNTER — Encounter: Payer: Self-pay | Admitting: Dermatology

## 2023-09-22 ENCOUNTER — Telehealth: Payer: Self-pay

## 2023-09-22 NOTE — Telephone Encounter (Signed)
Patient returned your call. He states that his leg was swelling, so he took a fluid pill before bed, and when he woke up this morning his leg looks and feels better. He asks that you return his call after 1 pm if possible since he sleeps in late now that he's retired.

## 2023-09-22 NOTE — Telephone Encounter (Addendum)
Tried calling patient regarding results. No answer. LMOM for patient to return call.    ----- Message from [name] sent at 09/20/2023  3:30 PM EST ----- FINAL DIAGNOSIS        1. Skin, left lateral leg :       WELL DIFFERENTIATED SQUAMOUS CELL CARCINOMA   Cancer = SCC Already treated Recheck next visit

## 2023-09-23 ENCOUNTER — Telehealth: Payer: Self-pay

## 2023-09-23 NOTE — Telephone Encounter (Addendum)
Called and discussed bx results with patient. He verbalized understanding.   ----- Message from [name] sent at 09/20/2023  3:30 PM EST ----- FINAL DIAGNOSIS        1. Skin, left lateral leg :       WELL DIFFERENTIATED SQUAMOUS CELL CARCINOMA   Cancer = SCC Already treated Recheck next visit

## 2023-09-30 ENCOUNTER — Ambulatory Visit: Payer: Medicare Other | Admitting: Dermatology

## 2023-09-30 ENCOUNTER — Encounter: Payer: Self-pay | Admitting: Dermatology

## 2023-09-30 DIAGNOSIS — Z85828 Personal history of other malignant neoplasm of skin: Secondary | ICD-10-CM

## 2023-09-30 DIAGNOSIS — Z8589 Personal history of malignant neoplasm of other organs and systems: Secondary | ICD-10-CM

## 2023-09-30 DIAGNOSIS — Z48817 Encounter for surgical aftercare following surgery on the skin and subcutaneous tissue: Secondary | ICD-10-CM

## 2023-09-30 NOTE — Patient Instructions (Signed)

## 2023-09-30 NOTE — Progress Notes (Signed)
   Follow-Up Visit   Subjective  Stephen Bennett. is a 82 y.o. male who presents for the following:  patient here today for 2 week follow up on wound at right lower leg. Patient has been using mupirocin ointment on wound daily.   Also hx of scc treated with ED&C at last appt on 09/16/2023.    The patient has spots, moles and lesions to be evaluated, some may be new or changing and the patient may have concern these could be cancer.   The following portions of the chart were reviewed this encounter and updated as appropriate: medications, allergies, medical history  Review of Systems:  No other skin or systemic complaints except as noted in HPI or Assessment and Plan.  Objective  Well appearing patient in no apparent distress; mood and affect are within normal limits.   A focused examination was performed of the following areas: B/l lower legs   Relevant exam findings are noted in the Assessment and Plan.    Assessment & Plan   Healing SCC excision site at right medial calf.  Exam: healing excision site  Leg edema almost resolved. All improving.  Treatment Plan: Healing and Improving   Wound cleaned with [place], mupirocin ointment applied with non stick guaze and wrap with coban.   Patient instructed he can continue using mupirocin ointment to affected area until healed.  Keep leg elevated and wear knee high graduated compression stockings   ACTINIC DAMAGE - chronic, secondary to cumulative UV radiation exposure/sun exposure over time - diffuse scaly erythematous macules with underlying dyspigmentation - Recommend daily broad spectrum sunscreen SPF 30+ to sun-exposed areas, reapply every 2 hours as needed.  - Recommend staying in the shade or wearing long sleeves, sun glasses (UVA+UVB protection) and wide brim hats (4-inch brim around the entire circumference of the hat). - Call for new or changing lesions.  HISTORY OF SQUAMOUS CELL CARCINOMA OF THE SKIN At left  lateral leg ED&C done 09/18/23 - No evidence of recurrence today - No lymphadenopathy - Recommend regular full body skin exams - Recommend daily broad spectrum sunscreen SPF 30+ to sun-exposed areas, reapply every 2 hours as needed.  - Call if any new or changing lesions are noted between office visits   Return in about 6 months (around 03/30/2024) for follow up .  IAsher Muir, CMA, am acting as scribe for [name], MD.   Documentation: I have reviewed the above documentation for accuracy and completeness, and I agree with the above.  [name], MD

## 2023-10-09 ENCOUNTER — Other Ambulatory Visit: Payer: Self-pay

## 2023-10-09 DIAGNOSIS — C44722 Squamous cell carcinoma of skin of right lower limb, including hip: Secondary | ICD-10-CM

## 2023-10-09 MED ORDER — MUPIROCIN 2 % EX OINT: TOPICAL_OINTMENT | CUTANEOUS | 0 refills | Status: DC

## 2023-10-09 NOTE — Progress Notes (Signed)
Patient left voicemail he did not have additional mupirocin at home. 1 RF sent in. aw

## 2023-10-13 ENCOUNTER — Other Ambulatory Visit: Payer: Self-pay | Admitting: Dermatology

## 2023-10-13 DIAGNOSIS — C44722 Squamous cell carcinoma of skin of right lower limb, including hip: Secondary | ICD-10-CM

## 2023-11-14 ENCOUNTER — Other Ambulatory Visit: Payer: Self-pay | Admitting: Internal Medicine

## 2023-11-14 NOTE — Telephone Encounter (Signed)
Refill request for traMADol (ULTRAM) 50 MG tablet   LOV - 06/26/23 Next OV - 07/02/24 Last refill - 09/03/23 #60/0

## 2023-11-14 NOTE — Telephone Encounter (Signed)
Copied from CRM [id number]. Topic: Clinical - Medication Refill >> Nov 14, 2023  3:41 PM Steele Sizer wrote: Most Recent Primary Care Visit:  Provider: Tillman Abide I  Department: [facility]  Visit Type: PHYSICAL  Date: 06/26/2023  Medication: traMADol (ULTRAM) 50 MG tablet  Has the patient contacted their pharmacy? No (Agent: If no, request that the patient contact the pharmacy for the refill. If patient does not wish to contact the pharmacy document the reason why and proceed with request.) (Agent: If yes, when and what did the pharmacy advise?)  Is this the correct pharmacy for this prescription? Yes If no, delete pharmacy and type the correct one.  This is the patient's preferred pharmacy:  [id number] [place], [place] - [address] [place] [address] Phone: [phone] Fax: [phone]  Has the prescription been filled recently? Yes  Is the patient out of the medication? No  Has the patient been seen for an appointment in the last year OR does the patient have an upcoming appointment? Yes  Can we respond through MyChart? No  Agent: Please be advised that Rx refills may take up to 3 business days. We ask that you follow-up with your pharmacy.

## 2023-11-15 MED ORDER — TRAMADOL HCL 50 MG PO TABS: 50.0000 mg | ORAL_TABLET | Freq: Three times a day (TID) | ORAL | 0 refills | Status: DC | PRN

## 2023-11-18 ENCOUNTER — Ambulatory Visit: Payer: Medicare Other | Admitting: Dermatology

## 2023-11-24 ENCOUNTER — Ambulatory Visit: Payer: Self-pay | Admitting: Internal Medicine

## 2023-11-24 NOTE — Telephone Encounter (Signed)
Okay--glad he is some better already

## 2023-11-24 NOTE — Telephone Encounter (Signed)
Spoke with patient and he is scheduled for tomorrow at 12:00. He stated that the pain has subsided some and it could of been something that he ate.

## 2023-11-24 NOTE — Telephone Encounter (Signed)
Copied from CRM [id number]. Topic: Clinical - Red Word Triage >> Nov 24, 2023  1:31 PM Deaijah H wrote: Red Word that prompted transfer to Nurse Triage: Back hurting real bad , going into right side. Stating its getting worse   Chief Complaint: severe pain Symptoms: worsening back pain that goes to side/abdomen, blood in urine, slurred speech, numbness in leg since surgery in November Frequency: continual Pertinent Negatives: Patient denies new numbness, weakness, SOB, chest pain Disposition: [] 911 / [] ED /[] Urgent Care (no appt availability in office) / [] Appointment(In office/virtual)/ []  [facility]/ [] Home Care/ [x] Refused Recommended Disposition /[] [place] [facility]/ []  Follow-up with PCP Additional Notes: Pt reporting severe pain in back, going into his right side, "towards front, in stomach area, more or less lower." Pt reporting the pain is worsening as phone call goes on, had rated pain at 8/10, "sore pain, not sharp," hydrocodone "helped a little bit for a while, but pain starting to get worse" and "tramadol didn't help." Pt reporting "hurting so bad, it's affecting the way I talk," pt has had what seemed like slurred speech through this phone call, pt confirms different from his normal way of speaking. Pt confirms no weakness, numbness, nurse asked pt to look in mirror to see about facial drooping, misunderstanding between nurse and pt, pt not hearing request, "have mirror in the bathroom." Nurse asked pt to raise arms out in front of him to see if raised same amount, pt confirms they do. Pt reporting that his has had numbness in leg since surgery in November, confirms no new numbness. Pt reporting urine "looked a little bit reddish." Advised pt be seen in hospital asap for slurred speech and severe pain, offered to call 911 for pt, pt refusing 911, pt reporting that it's just the "pain affecting the way I talk." Pt confirms that his pain does not normally affect his speech to  this extent. Advised strongly that pt call 911, pt refusing, pt reporting that he can ask neighbor to drive him to ED. Advised 911 if no luck with neighbor, advised that nurse will call back. Called pt back, pt reporting that neighbor is "eventually she's gonna take me over there, pain kinda went away and not really bothering me right now, she'll take me over later, her boyfriend's getting out of hospital" so "if pain comes back, I definitely will go." Pt reporting that "hydrocodone seemed to help, stomach's not upset or anything, was hurting on lower right side, in that area where hurting, bone sore, might've hit something without realizing it, been rubbing it in that area, feels kinda sore, not hurting right now but if comes back I'll have her to take me." Nurse reiterated recommendation to get to hospital asap. Pt verbalized understanding, plans to go if needed. Pt requesting appt with Dr. Alphonsus Sias, advised HP message would be sent over and pt would receive call if further recommendations.  Reason for Disposition  [1] Loss of speech or garbled speech AND [2] sudden onset AND [3] present now  Answer Assessment - Initial Assessment Questions 1. ONSET: "When did the pain begin?"      Always back pain, Dr. Alphonsus Sias prescribed, but more acute lately, yesterday and especially when woke up today, pain really bad 2. LOCATION: "Where does it hurt?" (upper, mid or lower back)     Going into right side, sore pain, not sharp 3. SEVERITY: "How bad is the pain?"  (e.g., Scale 1-10; mild, moderate, or severe)   - MILD (  1-3): Doesn't interfere with normal activities.    - MODERATE (4-7): Interferes with normal activities or awakens from sleep.    - SEVERE (8-10): Excruciating pain, unable to do any normal activities.      8/10 4. PATTERN: "Is the pain constant?" (e.g., yes, no; constant, intermittent)      consistent 5. RADIATION: "Does the pain shoot into your legs or somewhere else?"     Lower back pain moves  over to right side towards front, in stomach area more or less lower 8. MEDICINES: "What have you taken so far for the pain?" (e.g., nothing, acetaminophen, NSAIDS)     Took hydrocodone from hx leg surgery, helped a little bit for a while, but pain starting to get worse, tramadol didn't help 9. NEUROLOGIC SYMPTOMS: "Do you have any weakness, numbness, or problems with bowel/bladder control?"     Hurting so bad affecting the way I talk, no numbness, hurting more in front than back over on right side. Sounds like slurred speech 10. OTHER SYMPTOMS: "Do you have any other symptoms?" (e.g., fever, abdomen pain, burning with urination, blood in urine)       Slurred speech  Answer Assessment - Initial Assessment Questions 1. SYMPTOM: "What is the main symptom you are concerned about?" (e.g., weakness, numbness)     Slurred speech, pt concerned about severe pain, stating "pain is affecting the way I talk" 2. ONSET: "When did this start?" (minutes, hours, days; while sleeping)     Just you and the one other person I was talking to before you 4. PATTERN "Does this come and go, or has it been constant since it started?"  "Is it present now?"     Continual since started 5. CARDIAC SYMPTOMS: "Have you had any of the following symptoms: chest pain, difficulty breathing, palpitations?"     denies 7. OTHER SYMPTOMS: "Do you have any other symptoms?"      Pt concerned about severe pain to back/side/abdomen, getting worse even on phone  Protocols used: Back Pain-A-AH, Neurologic Deficit-A-AH

## 2023-11-25 ENCOUNTER — Telehealth: Payer: Self-pay | Admitting: Internal Medicine

## 2023-11-25 ENCOUNTER — Ambulatory Visit: Payer: Medicare Other | Admitting: Internal Medicine

## 2023-11-25 NOTE — Telephone Encounter (Signed)
Copied from CRM [id number]. Topic: Appointments - Scheduling Inquiry for Clinic >> Nov 25, 2023  3:29 PM Irine Seal wrote: Reason for CRM: patient had an appt scheduled for 11/25/23 at 12:00 he canceled it due to have back pain and lack of transportation, patient called into today requesting xrays for the back pain that was referring into his side and abdomen, he also stated his urine at one point was dark in color.  PT denied any pain at the moment during the call. He is requesting to be the xrays be scheduled for a thurs afternoon around 3-4. He stated he also had an Rx for tramadol to help with the pain. Patient callback [phone]

## 2023-11-26 NOTE — Telephone Encounter (Signed)
Left message on VM per DPR to call the office and schedule an OV with Dr Alphonsus Sias. He is here on Thursday afternoons. It would be best to schedule no later than 345 so that an xray can be done if needed.

## 2023-11-27 ENCOUNTER — Ambulatory Visit (INDEPENDENT_AMBULATORY_CARE_PROVIDER_SITE_OTHER): Payer: Medicare Other | Admitting: Internal Medicine

## 2023-11-27 ENCOUNTER — Encounter: Payer: Self-pay | Admitting: Internal Medicine

## 2023-11-27 VITALS — BP 118/60 | HR 74 | Temp 97.5°F | Ht 67.5 in | Wt 123.0 lb

## 2023-11-27 DIAGNOSIS — G8929 Other chronic pain: Secondary | ICD-10-CM | POA: Diagnosis not present

## 2023-11-27 DIAGNOSIS — M545 Low back pain, unspecified: Secondary | ICD-10-CM | POA: Diagnosis not present

## 2023-11-27 DIAGNOSIS — R310 Gross hematuria: Secondary | ICD-10-CM | POA: Diagnosis not present

## 2023-11-27 DIAGNOSIS — R102 Pelvic and perineal pain: Secondary | ICD-10-CM

## 2023-11-27 LAB — POC URINALSYSI DIPSTICK (AUTOMATED)
Bilirubin, UA: NEGATIVE
Glucose, UA: NEGATIVE
Nitrite, UA: NEGATIVE
Protein, UA: POSITIVE — AB
Spec Grav, UA: 1.02 (ref 1.010–1.025)
Urobilinogen, UA: 0.2 U/dL
pH, UA: 6 (ref 5.0–8.0)

## 2023-11-27 MED ORDER — HYDROCODONE-ACETAMINOPHEN 5-325 MG PO TABS: 1.0000 | ORAL_TABLET | Freq: Three times a day (TID) | ORAL | 0 refills | Status: DC | PRN

## 2023-11-27 NOTE — Assessment & Plan Note (Signed)
Tramadol every morning helps him get moving and continue usual activities Will decide about change to hydrocodone over time

## 2023-11-27 NOTE — Assessment & Plan Note (Signed)
New discomfort that is along with hematuria Hydrocodone helps more than tramadol--will refill

## 2023-11-27 NOTE — Assessment & Plan Note (Signed)
In smoker Urine is brown with positive blood and 1+ leuks Unlikely infection but will send culture Check renal CT with contrast Refer to urology

## 2023-11-27 NOTE — Addendum Note (Signed)
Addended by: Eual Fines on: 11/27/2023 02:49 PM   Modules accepted: Orders

## 2023-11-27 NOTE — Progress Notes (Signed)
Subjective:    Patient ID: Stephen Bennett., male    DOB: 1941/01/14, 83 y.o.   MRN: [id number]  HPI Here due to increasing back pain  Having more back pain--also along the sides Somewhat in lower abdomen as well Hard to describe---"more of a discomfort" Pain along flanks---?top of pelvis Leftover hydrocodone helps----the tramadol daily seems to help his back (takes it upon getting up) No radiation of pain into legs No leg weakness  Current Outpatient Medications on File Prior to Visit  Medication Sig Dispense Refill   calcium carbonate (TUMS - DOSED IN MG ELEMENTAL CALCIUM) 500 MG chewable tablet Chew 2-3 tablets by mouth daily.     cholecalciferol (VITAMIN D) 1000 units tablet Take 1,000 Units by mouth daily.     furosemide (LASIX) 20 MG tablet Take 1-2 tablets (20-40 mg total) by mouth daily as needed. For increased morning leg swelling 180 tablet 3   mupirocin ointment (BACTROBAN) 2 % Apply to healing wound QD. 22 g 0   nicotine (NICOTROL) 10 MG inhaler Inhale 1 continuous puffing into the lungs as needed for smoking cessation.     traMADol (ULTRAM) 50 MG tablet Take 1 tablet (50 mg total) by mouth 3 (three) times daily as needed. 60 tablet 0   No current facility-administered medications on file prior to visit.    No Known Allergies  Past Medical History:  Diagnosis Date   Actinic keratosis 03/27/2023   left pretibial, LN2 04/15/23   Basal cell carcinoma 06/11/2018   L zygoma - excision 10/13/2018   BCC (basal cell carcinoma of skin) 06/11/2018   L cheek 5.0 cm ant to the earlobe   Compression fracture of fourth lumbar vertebra (HCC)    SCC (squamous cell carcinoma) 09/18/2023   left lateral leg ED&C done 09/18/23   Squamous cell carcinoma of skin 04/02/2018   R distal lat calf - ED&C   Squamous cell carcinoma of skin 01/31/2022   L med bicep - ED&C   Squamous cell carcinoma of skin 06/12/2022   R temple - ED&C   Squamous cell carcinoma of skin 07/03/2023    left medial calf, EDC   Squamous cell carcinoma of skin 07/03/2023   right medial calf, EDC    Past Surgical History:  Procedure Laterality Date   SKIN CANCER EXCISION     several    Family History  Problem Relation Age of Onset   Cancer Mother    Heart disease Neg Hx    Diabetes Neg Hx     Social History   Socioeconomic History   Marital status: Divorced    Spouse name: Not on file   Number of children: 0   Years of education: Not on file   Highest education level: Not on file  Occupational History   Occupation: Customer service    Comment: Retired   Occupation: Leisure centre manager    Comment: retired  Tobacco Use   Smoking status: Every Day    Current packs/day: 0.50    Average packs/day: 0.5 packs/day for 63.0 years (31.5 ttl pk-yrs)    Types: Cigarettes    Passive exposure: Past   Smokeless tobacco: Never  Vaping Use   Vaping status: Never Used  Substance and Sexual Activity   Alcohol use: Not Currently    Comment: has not had any alcohol since 2018   Drug use: Not Currently   Sexual activity: Not on file  Other Topics Concern   Not on file  Social History  Narrative   No living will      Would want neighbor Mindi Curling (and her daughter Joni Reining) to make decisions   He does have cousins in [place]   Would accept resuscitation   May accept tube feeds   Social Drivers of Health   Financial Resource Strain: Not on file  Food Insecurity: Not on file  Transportation Needs: Not on file  Physical Activity: Not on file  Stress: Not on file  Social Connections: Not on file  Intimate Partner Violence: Not on file   Review of Systems Skin cancer removed from right leg in November. Got some hydrocodone--then No dysuria or trouble voiding--but has noted some red in his urine at the end of the stream Does drink a lot of water    Objective:   Physical Exam Musculoskeletal:     Comments: No spine tenderness Slight back pain with full SLR Hip ROM is  okay----but does have pain over anterior pelvis  Neurological:     Comments: Normal gait for him--slightly bent over No leg weakness            Assessment & Plan:

## 2023-11-28 LAB — URINE CULTURE
MICRO NUMBER:: 16020073
Result:: NO GROWTH
SPECIMEN QUALITY:: ADEQUATE

## 2023-11-28 LAB — CBC
HCT: 30.1 % — ABNORMAL LOW (ref 39.0–52.0)
Hemoglobin: 9.6 g/dL — ABNORMAL LOW (ref 13.0–17.0)
MCHC: 32 g/dL (ref 30.0–36.0)
MCV: 110 fL — ABNORMAL HIGH (ref 78.0–100.0)
Platelets: 149 10*3/uL — ABNORMAL LOW (ref 150.0–400.0)
RBC: 2.73 Mil/uL — ABNORMAL LOW (ref 4.22–5.81)
RDW: 25.4 % — ABNORMAL HIGH (ref 11.5–15.5)
WBC: 7.3 10*3/uL (ref 4.0–10.5)

## 2023-11-28 LAB — RENAL FUNCTION PANEL
Albumin: 4 g/dL (ref 3.5–5.2)
BUN: 25 mg/dL — ABNORMAL HIGH (ref 6–23)
CO2: 30 meq/L (ref 19–32)
Calcium: 9.3 mg/dL (ref 8.4–10.5)
Chloride: 106 meq/L (ref 96–112)
Creatinine, Ser: 0.78 mg/dL (ref 0.40–1.50)
GFR: 82.62 mL/min (ref >60.00)
Glucose, Bld: 102 mg/dL — ABNORMAL HIGH (ref 70–99)
Phosphorus: 3 mg/dL (ref 2.3–4.6)
Potassium: 4.7 meq/L (ref 3.5–5.1)
Sodium: 142 meq/L (ref 135–145)

## 2023-12-17 ENCOUNTER — Emergency Department
Admission: EM | Admit: 2023-12-17 | Discharge: 2023-12-17 | Disposition: A | Discharge status: Home / Self Care | Payer: Medicare Other | Attending: Emergency Medicine | Admitting: Emergency Medicine

## 2023-12-17 ENCOUNTER — Ambulatory Visit: Payer: Self-pay | Admitting: Internal Medicine

## 2023-12-17 ENCOUNTER — Emergency Department: Payer: Medicare Other

## 2023-12-17 DIAGNOSIS — R109 Unspecified abdominal pain: Secondary | ICD-10-CM | POA: Diagnosis not present

## 2023-12-17 DIAGNOSIS — R1031 Right lower quadrant pain: Secondary | ICD-10-CM | POA: Diagnosis not present

## 2023-12-17 DIAGNOSIS — I10 Essential (primary) hypertension: Secondary | ICD-10-CM | POA: Diagnosis not present

## 2023-12-17 DIAGNOSIS — N132 Hydronephrosis with renal and ureteral calculous obstruction: Secondary | ICD-10-CM | POA: Insufficient documentation

## 2023-12-17 DIAGNOSIS — R1030 Lower abdominal pain, unspecified: Secondary | ICD-10-CM | POA: Diagnosis not present

## 2023-12-17 DIAGNOSIS — R319 Hematuria, unspecified: Secondary | ICD-10-CM

## 2023-12-17 DIAGNOSIS — K8020 Calculus of gallbladder without cholecystitis without obstruction: Secondary | ICD-10-CM | POA: Diagnosis not present

## 2023-12-17 DIAGNOSIS — K5730 Diverticulosis of large intestine without perforation or abscess without bleeding: Secondary | ICD-10-CM | POA: Diagnosis not present

## 2023-12-17 LAB — CBC
HCT: 32.6 % — ABNORMAL LOW (ref 39.0–52.0)
Hemoglobin: 10.7 g/dL — ABNORMAL LOW (ref 13.0–17.0)
MCH: 34.6 pg — ABNORMAL HIGH (ref 26.0–34.0)
MCHC: 32.8 g/dL (ref 30.0–36.0)
MCV: 105.5 fL — ABNORMAL HIGH (ref 80.0–100.0)
Platelets: 171 10*3/uL (ref 150–400)
RBC: 3.09 MIL/uL — ABNORMAL LOW (ref 4.22–5.81)
RDW: 24.1 % — ABNORMAL HIGH (ref 11.5–15.5)
WBC: 15.2 10*3/uL — ABNORMAL HIGH (ref 4.0–10.5)
nRBC: 0.1 % (ref 0.0–0.2)

## 2023-12-17 LAB — COMPREHENSIVE METABOLIC PANEL
ALT: 18 U/L (ref 0–44)
AST: 22 U/L (ref 15–41)
Albumin: 4 g/dL (ref 3.5–5.0)
Alkaline Phosphatase: 79 U/L (ref 38–126)
Anion gap: 10 (ref 5–15)
BUN: 29 mg/dL — ABNORMAL HIGH (ref 8–23)
CO2: 25 mmol/L (ref 22–32)
Calcium: 9.5 mg/dL (ref 8.9–10.3)
Chloride: 104 mmol/L (ref 98–111)
Creatinine, Ser: 1.28 mg/dL — ABNORMAL HIGH (ref 0.61–1.24)
GFR, Estimated: 56 mL/min — ABNORMAL LOW (ref >60)
Glucose, Bld: 121 mg/dL — ABNORMAL HIGH (ref 70–99)
Potassium: 4.7 mmol/L (ref 3.5–5.1)
Sodium: 139 mmol/L (ref 135–145)
Total Bilirubin: 1.5 mg/dL — ABNORMAL HIGH (ref 0.0–1.2)
Total Protein: 6.9 g/dL (ref 6.5–8.1)

## 2023-12-17 LAB — URINALYSIS, ROUTINE W REFLEX MICROSCOPIC
Bacteria, UA: NONE SEEN
Bilirubin Urine: NEGATIVE
Glucose, UA: NEGATIVE mg/dL
Ketones, ur: NEGATIVE mg/dL
Leukocytes,Ua: NEGATIVE
Nitrite: NEGATIVE
Protein, ur: 30 mg/dL — AB
RBC / HPF: >50 RBC/hpf (ref 0–5)
Specific Gravity, Urine: 1.02 (ref 1.005–1.030)
Squamous Epithelial / HPF: 0 /[HPF] (ref 0–5)
pH: 8 (ref 5.0–8.0)

## 2023-12-17 LAB — LIPASE, BLOOD: Lipase: 23 U/L (ref 11–51)

## 2023-12-17 MED ORDER — KETOROLAC TROMETHAMINE 30 MG/ML IJ SOLN
15.0000 mg | Freq: Once | INTRAMUSCULAR | Status: AC
  Administered 2023-12-17: 15 mg via INTRAVENOUS
  Filled 2023-12-17: qty 1

## 2023-12-17 MED ORDER — SODIUM CHLORIDE 0.9 % IV BOLUS
1000.0000 mL | Freq: Once | INTRAVENOUS | Status: AC
  Administered 2023-12-17: 1000 mL via INTRAVENOUS

## 2023-12-17 MED ORDER — CEPHALEXIN 500 MG PO CAPS: 500.0000 mg | ORAL_CAPSULE | Freq: Two times a day (BID) | ORAL | 0 refills | Status: DC

## 2023-12-17 MED ORDER — HYDROCODONE-ACETAMINOPHEN 5-325 MG PO TABS: 1.0000 | ORAL_TABLET | ORAL | 0 refills | Status: DC | PRN

## 2023-12-17 NOTE — Telephone Encounter (Signed)
 Chief Complaint: Stephen Bennett to ED this morning/make PCP aware Symptoms: Hematuria, abdominal pain Pertinent Negatives: Patient denies pain right now Disposition: [] ED /[] Urgent Care (no appt availability in office) / [] Appointment(In office/virtual)/ []  [facility]/ [] Home Care/ [x] Refused Recommended Disposition /[] [facility]/ []  Follow-up with PCP Additional Notes: Pt was seen in office on 1/30 by Dr. Alphonsus Sias for pain in pelvis. Pt was prescribed pain medications. Pt is scheduled to see a urologist on 3/17. Pt went to ED today at 6 AM for abdominal pain and hematuria. Pt states they ruled out any infection in his blood. Pt was told he might have had a kidney stone that has now passed. Pt was discharged with pain medicine (he thinks hydrocodone), an antibiotic, and told to follow up with urology. Pt wants to make Dr. Alphonsus Sias aware and refuses making a follow up appt at this time. This RN educated pt on home care, new-worsening symptoms, when to call back/seek emergent care. Pt verbalized understanding and agrees to plan.   Copied from CRM [id number]. Topic: Clinical - Red Word Triage >> Dec 17, 2023 12:38 PM Irine Seal wrote: [facility] that prompted transfer to Nurse Triage: was seen today at ED, sue to pain that kept him awake last night, called EMS and taken to the hospital- right flank pain as well as hematuria.  According to the patient over the past few years he has been experiencing pain in his right flank/back however he states over the last 3 days it has become more significant and feels different than his typical back pain.  Patient also states he has noticed blood in his urine over the last 3 days as well.  He states back in October he briefly saw blood in his urine but it went away after 1 or 2 episodes.  Denies any dysuria.  No fever.  Pt was administered   sodium chloride 0.9 % bolus 1,000 mL  & ketorolac (TORADOL) 30 MG/ML injection 15 mg He stated the ED advised him to call and  notify PCP. Reason for Disposition  Blood in urine (red, pink, or tea-colored)  Answer Assessment - Initial Assessment Questions 1. LOCATION: "Where does it hurt?"      Abdomen, no pain right now 2. RADIATION: "Does the pain shoot anywhere else?" (e.g., chest, back)     Into stomach 6. SEVERITY: "How bad is the pain?"  (e.g., Scale 1-10; mild, moderate, or severe)    - MILD (1-3): Doesn't interfere with normal activities, abdomen soft and not tender to touch.     - MODERATE (4-7): Interferes with normal activities or awakens from sleep, abdomen tender to touch.     - SEVERE (8-10): Excruciating pain, doubled over, unable to do any normal activities.       No pain 8. CAUSE: "What do you think is causing the stomach pain?"     He thinks kidney stone 9. RELIEVING/AGGRAVATING FACTORS: "What makes it better or worse?" (e.g., antacids, bending or twisting motion, bowel movement)     Pain medicine,he thinks its hydrocodone  Protocols used: Abdominal Pain - Male-A-AH

## 2023-12-17 NOTE — Discharge Instructions (Addendum)
 Please take your antibiotics as prescribed for their entire course.  You may take your pain medication as needed but only as prescribed.  Do not drink alcohol or drive while taking this medication.  Please follow-up with your doctor as well as urology as soon as you are able for further evaluation.  Return to the emergency department for any symptom concerning to yourself.

## 2023-12-17 NOTE — ED Triage Notes (Signed)
 See First RN note.  Pt c/o R sided abdominal pain x1 year increasing x3-4 days and intermittent hematuria x4 days.  Pain score 10/10.

## 2023-12-17 NOTE — Telephone Encounter (Signed)
 Aware, Dr Alphonsus Sias is not in today but will route to him for further advisement if applicable  Agree with ER precautions

## 2023-12-17 NOTE — ED Notes (Addendum)
 Pt given urinal at this time, pt able to stand on the side of the bed and use the urinal. No assistance needed

## 2023-12-17 NOTE — ED Provider Notes (Signed)
 [facility] Provider Note    Event Date/Time   First MD Initiated Contact with Patient 12/17/23 [phone]     (approximate)  History   Chief Complaint: Abdominal Pain and Hematuria  HPI  Stephen Bennett. is a 83 y.o. male presents to the emergency department for right flank pain as well as hematuria.  According to the patient over the past few years he has been experiencing pain in his right flank/back however he states over the last 3 days it has become more significant and feels different than his typical back pain.  Patient also states he has noticed blood in his urine over the last 3 days as well.  He states back in October he briefly saw blood in his urine but it went away after 1 or 2 episodes.  Denies any dysuria.  No fever.  Physical Exam   Triage Vital Signs: ED Triage Vitals  Encounter Vitals Group     BP 12/17/23 0726 121/80     Systolic BP Percentile --      Diastolic BP Percentile --      Pulse Rate 12/17/23 0726 92     Resp 12/17/23 0726 18     Temp 12/17/23 0726 98.2 F (36.8 C)     Temp Source 12/17/23 0726 Oral     SpO2 12/17/23 0726 99 %     Weight 12/17/23 0727 123 lb (55.8 kg)     Height 12/17/23 0727 5\' 6"  (1.676 m)     Head Circumference --      Peak Flow --      Pain Score 12/17/23 0733 10     Pain Loc --      Pain Education --      Exclude from Growth Chart --     Most recent vital signs: Vitals:   12/17/23 0726  BP: 121/80  Pulse: 92  Resp: 18  Temp: 98.2 F (36.8 C)  SpO2: 99%    General: Awake, no distress.  CV:  Good peripheral perfusion.  Regular rate and rhythm  Resp:  Normal effort.  Equal breath sounds bilaterally.  Abd:  No distention.  Soft, nontender.  No rebound or guarding.  No reaction to abdominal palpation.  ED Results / Procedures / Treatments   MEDICATIONS ORDERED IN ED: Medications  sodium chloride 0.9 % bolus 1,000 mL (has no administration in time range)  ketorolac (TORADOL) 30 MG/ML  injection 15 mg (has no administration in time range)    IMPRESSION / MDM / ASSESSMENT AND PLAN / ED COURSE  I reviewed the triage vital signs and the nursing notes.  Patient's presentation is most consistent with acute presentation with potential threat to life or bodily function.  Patient presents to the emergency department for 3 days of right flank pain now with hematuria.  No history of kidney stones previously.  Denies any dysuria.  No fever.  Will check labs we will obtain a CT renal scan as well as a urine sample.  Differential would include ureterolithiasis, urinary tract infection or other intra-abdominal pathology.  Patient is lab work shows slight leukocytosis otherwise reassuring CBC, reassuring chemistry.  Normal LFTs, normal lipase.  Patient's urinalysis does show greater than 50 red cells with no bacteria.  CT scan has resulted showing right-sided hydronephrosis without definitive stone.  Possibly recently passed stone.  Given the hematuria I have sent a urine culture.  We will treat the patient's pain with Norco to be used as  needed.  Will have the patient follow-up with urology with whom he already has a follow-up appointment scheduled.  FINAL CLINICAL IMPRESSION(S) / ED DIAGNOSES   Right flank pain Hematuria  Note:  This document was prepared using Dragon voice recognition software and may include unintentional dictation errors.   Minna Antis, MD 12/17/23 1036

## 2023-12-17 NOTE — ED Notes (Signed)
 First Nurse Note: Pt to ED via [facility] from home for intermittent RLQ abdominal pain since the 1980's. This episode has been going on for the last few days. Pt states that the pain moves to the left side when he lays down. Pt also states that he some hematuria last night. Pt ambulatory. VSS with EMS.

## 2023-12-18 LAB — URINE CULTURE: Culture: NO GROWTH

## 2023-12-18 NOTE — Telephone Encounter (Signed)
 Left detailed message on VM per DPR with Dr Karle Starch message.

## 2023-12-24 ENCOUNTER — Other Ambulatory Visit: Payer: Self-pay | Admitting: Internal Medicine

## 2023-12-24 NOTE — Telephone Encounter (Signed)
 Copied from CRM [id number]. Topic: Clinical - Medication Refill >> Dec 24, 2023 12:40 PM Alcus Dad wrote: Most Recent Primary Care Visit:  Provider: Tillman Abide I  Department: [facility]  Visit Type: ACUTE  Date: 11/27/2023  Medication: cephALEXin (KEFLEX) 500 MG capsule HYDROcodone-acetaminophen (NORCO/VICODIN) 5-325 MG tablet  Has the patient contacted their pharmacy? No (Agent: If no, request that the patient contact the pharmacy for the refill. If patient does not wish to contact the pharmacy document the reason why and proceed with request.) (Agent: If yes, when and what did the pharmacy advise?)  Is this the correct pharmacy for this prescription? Yes If no, delete pharmacy and type the correct one.  This is the patient's preferred pharmacy:  [id number] [place], [place] - [address] [place] [address] Phone: [phone] Fax: [phone]  [facility] - [place], [place] - [address] [place] [address] Phone: [phone] Fax: [phone]   Has the prescription been filled recently? Yes  Is the patient out of the medication? Yes  Has the patient been seen for an appointment in the last year OR does the patient have an upcoming appointment? Yes  Can we respond through MyChart? No  Agent: Please be advised that Rx refills may take up to 3 business days. We ask that you follow-up with your pharmacy.

## 2023-12-25 ENCOUNTER — Other Ambulatory Visit: Payer: Self-pay | Admitting: Internal Medicine

## 2023-12-25 MED ORDER — HYDROCODONE-ACETAMINOPHEN 5-325 MG PO TABS: 1.0000 | ORAL_TABLET | ORAL | 0 refills | Status: DC | PRN

## 2023-12-25 NOTE — Telephone Encounter (Signed)
 Spoke to pt. Will contact urology tomorrow.

## 2023-12-25 NOTE — Telephone Encounter (Signed)
 Patient is needing those medications refilled he would like call back

## 2023-12-25 NOTE — Telephone Encounter (Signed)
 Copied from CRM [id number]. Topic: Clinical - Medication Refill >> Dec 25, 2023  3:38 PM Aletta Edouard wrote: Most Recent Primary Care Visit:  Provider: Tillman Abide I  Department: [facility]  Visit Type: ACUTE  Date: 11/27/2023  Medication: HYDROcodone-acetaminophen (NORCO/VICODIN)   and cephALEXin (KEFLEX) 500 MG capsule [ Has the patient contacted their pharmacy? Yes (Agent: If no, request that the patient contact the pharmacy for the refill. If patient does not wish to contact the pharmacy document the reason why and proceed with request.) (Agent: If yes, when and what did the pharmacy advise?)  Is this the correct pharmacy for this prescription? Yes If no, delete pharmacy and type the correct one.  This is the patient's preferred pharmacy:  [id number] [place], [place] - [address] [place] [address] Phone: [phone] Fax: [phone]  [facility] - [place], [place] - [address] [place] [address] Phone: [phone] Fax: [phone]   Has the prescription been filled recently? No  Is the patient out of the medication? Yes  Has the patient been seen for an appointment in the last year OR does the patient have an upcoming appointment? Yes  Can we respond through MyChart? No  Agent: Please be advised that Rx refills may take up to 3 business days. We ask that you follow-up with your pharmacy.

## 2023-12-25 NOTE — Telephone Encounter (Signed)
 Pt went to ER 12-17-23 for hematuria. He said he has called the urologist to try and get seen sooner but they have not responded back to him. He is still having pain. Said the tramadol he usually takes does not help the pain on the right side he is having. The hydrocodone helps that. Even on the cephalexin, he had blood most of the time.

## 2023-12-25 NOTE — Telephone Encounter (Signed)
 Duplicate requests

## 2023-12-25 NOTE — Telephone Encounter (Signed)
 I refilled the hydrocodone for the pain but not the cephalexin (since he doesn't seem to have any infection) Might want to make sure he is on the cancellation list at Dr Heywood Footman office--appt not till 3/17

## 2023-12-29 ENCOUNTER — Telehealth: Payer: Self-pay | Admitting: Internal Medicine

## 2023-12-29 NOTE — Telephone Encounter (Signed)
 Culley, Amieya1 hour ago (3:00 PM)   AC Pt called back returning Alantis Bethune's call. Told pt Dr. Norberta Keens response. Pt stated he was feeling a lot better, still taking traMADol (ULTRAM) 50 MG tablet. Pt stated he hasn't taken HYDROcodone-acetaminophen (NORCO/VICODIN) 5-325 MG tablet, in about 2/3 days. Pt also mentioned his urine has been clear of blood since last Sat, 3/1. Pt stated his appt with the urologist is 01/12/24. Pt had no questions/concerns. Call back # [phone].

## 2023-12-29 NOTE — Telephone Encounter (Signed)
 Left message to call office with an update on he is doing today. I was not able to call urology on Friday.

## 2023-12-29 NOTE — Telephone Encounter (Signed)
 Pt called back returning Shannon's call. Told pt Dr. Norberta Keens response. Pt stated he was feeling a lot better, still taking traMADol (ULTRAM) 50 MG tablet. Pt stated he hasn't taken HYDROcodone-acetaminophen (NORCO/VICODIN) 5-325 MG tablet, in about 2/3 days. Pt also mentioned his urine has been clear of blood since last Sat, 3/1. Pt stated his appt with the urologist is 01/12/24. Pt had no questions/concerns. Call back # [phone].

## 2023-12-29 NOTE — Telephone Encounter (Signed)
 Okay--glad he is better and okay to wait 2 weeks for urologist as long as things are better

## 2024-01-12 ENCOUNTER — Ambulatory Visit: Payer: Medicare Other | Admitting: Urology

## 2024-01-13 ENCOUNTER — Telehealth: Payer: Self-pay

## 2024-01-13 NOTE — Telephone Encounter (Signed)
 Patient called with some swelling of his R leg where surgery was done 09/02/23. Patient did not complain of any skin issues of the legs but ask for a prescription to help with the swelling. Should he contact another specialist or PCP or do you want to see patient?

## 2024-01-14 NOTE — Telephone Encounter (Signed)
 Left message for patient to return my call and scheduled him appt next week. aw

## 2024-01-19 NOTE — Telephone Encounter (Signed)
 Patient called the office today and scheduled appt with front desk. aw

## 2024-01-20 ENCOUNTER — Ambulatory Visit: Admitting: Dermatology

## 2024-01-20 ENCOUNTER — Other Ambulatory Visit: Payer: Self-pay | Admitting: Internal Medicine

## 2024-01-20 NOTE — Telephone Encounter (Signed)
 Copied from CRM [id number]. Topic: Clinical - Medication Refill >> Jan 20, 2024 12:52 PM Denese Killings wrote: Most Recent Primary Care Visit:  Provider: Tillman Abide I  Department: [facility]  Visit Type: ACUTE  Date: 11/27/2023  Medication: traMADol (ULTRAM) 50 MG tablet  Has the patient contacted their pharmacy? No (Agent: If no, request that the patient contact the pharmacy for the refill. If patient does not wish to contact the pharmacy document the reason why and proceed with request.) (Agent: If yes, when and what did the pharmacy advise?)  Is this the correct pharmacy for this prescription? Yes If no, delete pharmacy and type the correct one.  This is the patient's preferred pharmacy:  [id number] [place], [place] - [address] [place] [address] Phone: [phone] Fax: [phone]  Has the prescription been filled recently? No  Is the patient out of the medication? Yes 1 left  Has the patient been seen for an appointment in the last year OR does the patient have an upcoming appointment? Yes  Can we respond through MyChart? No  Agent: Please be advised that Rx refills may take up to 3 business days. We ask that you follow-up with your pharmacy.

## 2024-01-21 ENCOUNTER — Other Ambulatory Visit: Payer: Self-pay | Admitting: Internal Medicine

## 2024-01-21 NOTE — Telephone Encounter (Signed)
Left message on VM per DPR. 

## 2024-01-21 NOTE — Telephone Encounter (Signed)
 Last filled 11-15-23 #60 Last OV 11-27-23 Next [facility] 07[facility]01-24 [facility] [address]

## 2024-01-21 NOTE — Telephone Encounter (Signed)
 Copy and pasted this to the message already open about this. We were waiting on his call back.

## 2024-01-21 NOTE — Telephone Encounter (Signed)
 Copied from CRM [id number]. Topic: Clinical - Medication Question >> Jan 21, 2024  3:08 PM Tiffany S wrote: Reason for CRM: Patient called stating that he needs a refill on Tramdol and not the hydrocodone he stated he didn't need to make an appt for that please follow up with patient. He would like to have call back during the afternoon time

## 2024-01-21 NOTE — Telephone Encounter (Signed)
 Tristan Schroeder, RN     01/21/24  3:39 PM Note Copied from CRM [id number]. Topic: Clinical - Medication Question >> Jan 21, 2024  3:08 PM Tiffany S wrote: Reason for CRM: Patient called stating that he needs a refill on Tramdol and not the hydrocodone he stated he didn't need to make an appt for that please follow up with patient. He would like to have call back during the afternoon time

## 2024-01-22 MED ORDER — TRAMADOL HCL 50 MG PO TABS: 50.0000 mg | ORAL_TABLET | Freq: Three times a day (TID) | ORAL | 0 refills | Status: DC | PRN

## 2024-02-09 ENCOUNTER — Other Ambulatory Visit: Payer: Self-pay | Admitting: Internal Medicine

## 2024-02-09 MED ORDER — FUROSEMIDE 20 MG PO TABS
20.0000 mg | ORAL_TABLET | Freq: Every day | ORAL | 3 refills | Status: DC | PRN
Start: 2024-02-09 — End: 2024-05-15

## 2024-02-09 MED ORDER — HYDROCODONE-ACETAMINOPHEN 5-325 MG PO TABS: 1.0000 | ORAL_TABLET | ORAL | 0 refills | Status: DC | PRN

## 2024-02-09 NOTE — Telephone Encounter (Signed)
 Patient still needs refill of Hydrocodone.   Also, patient has been having left side pain in the past few days.   Please advise, thanks.

## 2024-02-09 NOTE — Addendum Note (Signed)
 Addended by: Curt Dover I on: 02/09/2024 04:37 PM   Modules accepted: Orders

## 2024-02-09 NOTE — Telephone Encounter (Signed)
 Let him know I sent a few more hydrocodone. Make sure he knows not to take with the tramadol

## 2024-02-09 NOTE — Telephone Encounter (Signed)
 Spoke to pt

## 2024-02-09 NOTE — Telephone Encounter (Signed)
 Spoke to pt. He canceled his appointment with urology because he said what the hospital did helped. The last few days the pain has come back. He is going to call urology to set up, again. He said he has a few left. I asked him was he supposed to be taking tramadol and the hydrocodone. I thought we had discussed not doing that, but not sure. Will forward to Dr Joelle Musca. He said he only takes the hydrocodone 1 randomly.

## 2024-02-09 NOTE — Telephone Encounter (Signed)
 Copied from CRM [id number]. Topic: Clinical - Medication Refill >> Feb 09, 2024  9:56 AM Oddis Bench wrote: Most Recent Primary Care Visit:  Provider: Curt Dover I  Department: [facility]  Visit Type: ACUTE  Date: 11/27/2023  Medication: furosemide (LASIX) 20 MG tablet  Has the patient contacted their pharmacy? No (Agent: If no, request that the patient contact the pharmacy for the refill. If patient does not wish to contact the pharmacy document the reason why and proceed with request.) (Agent: If yes, when and what did the pharmacy advise?)  Is this the correct pharmacy for this prescription? Yes If no, delete pharmacy and type the correct one.  This is the patient's preferred pharmacy:  [id number] [place], [place] - [address] [place] [address] Phone: [phone] Fax: [phone]  [facility] - [place], [place] - [address] [place] [address] Phone: [phone] Fax: [phone]   Has the prescription been filled recently? No  Is the patient out of the medication? No  Has the patient been seen for an appointment in the last year OR does the patient have an upcoming appointment? Yes  Can we respond through MyChart? No  Agent: Please be advised that Rx refills may take up to 3 business days. We ask that you follow-up with your pharmacy.

## 2024-02-24 ENCOUNTER — Ambulatory Visit: Admitting: Dermatology

## 2024-03-23 ENCOUNTER — Telehealth: Payer: Self-pay | Admitting: Internal Medicine

## 2024-03-23 NOTE — Telephone Encounter (Unsigned)
 Copied from CRM [id number]. Topic: Clinical - Medication Refill >> Mar 23, 2024  1:24 PM Kita Perish H wrote: Medication: traMADol  (ULTRAM ) 50 MG tablet   Has the patient contacted their pharmacy? No, wants provider to increase dosage/amount of pills (Agent: If no, request that the patient contact the pharmacy for the refill. If patient does not wish to contact the pharmacy document the reason why and proceed with request.) (Agent: If yes, when and what did the pharmacy advise?)  This is the patient's preferred pharmacy:  [id number] [place], [place] - [address] [place] [address] Phone: [phone] Fax: [phone]   Is this the correct pharmacy for this prescription? Yes If no, delete pharmacy and type the correct one.   Has the prescription been filled recently? No  Is the patient out of the medication? Yes  Has the patient been seen for an appointment in the last year OR does the patient have an upcoming appointment? Yes  Can we respond through MyChart? No  Agent: Please be advised that Rx refills may take up to 3 business days. We ask that you follow-up with your pharmacy.

## 2024-03-24 MED ORDER — TRAMADOL HCL 50 MG PO TABS: 50.0000 mg | ORAL_TABLET | Freq: Three times a day (TID) | ORAL | 0 refills | Status: DC | PRN

## 2024-03-24 NOTE — Telephone Encounter (Signed)
 Copied from CRM [id number]. Topic: Clinical - Medication Question >> Mar 24, 2024  2:09 PM Dewanda Foots wrote: Reason for CRM: Pt calling to check on the status of his medication refill for his tramadol . I let him know that it does take up to 3 business days for us  to refill it for him but that the request was sent in for him yesterday 5/27.  He requests that we please give him a call once we send it over for him at [phone] Thank you so much!

## 2024-03-24 NOTE — Telephone Encounter (Signed)
 Actually, it looks like the message was routed to [facility] Nurse Triage and was never addressed.   Last filled 01-22-24 #60 Last OV 11-27-23 [facility] [address]

## 2024-04-01 ENCOUNTER — Ambulatory Visit: Admitting: Urology

## 2024-04-07 ENCOUNTER — Telehealth: Payer: Self-pay | Admitting: Internal Medicine

## 2024-04-07 NOTE — Telephone Encounter (Signed)
 This will need to stay in Dr Alger Infield inbox to answer when he returns next week.

## 2024-04-07 NOTE — Telephone Encounter (Signed)
 Copied from CRM [id number]. Topic: Clinical - Order For Equipment >> Apr 07, 2024 10:26 AM Marlan Silva wrote: Reason for CRM: [facility] told patient to contact Dr. Joelle Musca and ask what wraps or shoes they should provide the patient.   [facility]  p: [phone]  f: [phone]

## 2024-04-15 NOTE — Telephone Encounter (Signed)
 Left detailed message on VM asking pt to call us  and let us  know what he is requesting.

## 2024-04-16 ENCOUNTER — Ambulatory Visit: Payer: Self-pay

## 2024-04-16 NOTE — Telephone Encounter (Signed)
 Left message to call office

## 2024-04-16 NOTE — Telephone Encounter (Signed)
 Copied from CRM [id number]. Topic: Clinical - Medication Question >> Apr 16, 2024 11:15 AM Dewanda Foots wrote: Reason for CRM: Pt is returning phone call to [facility] about the swelling in his feet. States the medical supply does not have any shoes are comfortable for him to wear right now. He wants to know if Dr. Joelle Musca would prescribe a stronger urosemide (LASIX ) 20 MG tablet for his swelling.  Pt callback is [phone]

## 2024-04-16 NOTE — Telephone Encounter (Addendum)
 FYI Only or Action Required?: Action required by provider: medication refill request.  Patient was last seen in primary care on 11/27/2023 by Helaine Llanos, MD. Called Nurse Triage reporting Foot Swelling. Symptoms began ongoing. Interventions attempted: Nothing. Symptoms are: gradually worsening. Both legs are swollen the right more so. Pt would like either a new medication for swelling or increase dose of current med. Pt was advised at the shoe store to try leg wraps, pt wants provider's opinion. Pt also has a container of Lymph support supplements he is considering taking.   Triage Disposition: See Physician Within 24 Hours  Patient/caregiver understands and will follow disposition?: No, refuses disposition - pt does not feel safe driving with swollen feet.          Copied from CRM [id number].              Topic: Clinical - Red Word Triage >> Apr 16, 2024 11:20 AM Dewanda Foots wrote: Red Word that prompted transfer to Nurse Triage: Pt has chronic swelling in  both feet which PCP is aware of. He is having trouble driving and would like to know if PCP could increase the dose of his urosemide (LASIX ) 20 MG tablet.  Needing some clinical advice Reason for Disposition  [1] Swelling of both ankles/feet AND [2] large  [1] MODERATE leg swelling (e.g., swelling extends up to knees) AND [2] new-onset or worsening  Answer Assessment - Initial Assessment Questions 1. ONSET: When did the swelling start? (e.g., minutes, hours, days)     Gotten worse 2. LOCATION: What part of the leg is swollen?  Are both legs swollen or just one leg?     Can go  up to knee -mostly in right leg 3. DEGREE OF SWELLING: How large is the swelling?  - LOCALIZED - Small area of swelling on part of one leg (estimate the size) - WIDESPREAD - Swelling involves a large part of leg (calf, thigh or whole leg) or both legs/feet     widespread 4. SEVERITY of WIDESPREAD SWELLING (e.g., Edema):  How bad is the swelling? - MILD edema - swelling limited to foot and ankle, pitting edema < 1/4 inch deep, rest and elevation eliminate most or all swelling - MODERATE edema - swelling of lower leg to knee, pitting edema > 1/4 inch deep, rest and elevation only partially reduce swelling - SEVERE edema - swelling extends above knee, facial or hand swelling also present      moderate 5. REDNESS: Does the swelling look red or infected?     Yes right leg where sx 6. PAIN: Is there any pain? If so, ask, How bad is it?     no 7. ITCH: Does the swelling itch? If so, ask, How much?     no 8. CAUSE: What do you think caused the swelling?      unsure 9. CHRONIC DISEASE: Does your child have kidney, heart or liver disease?     Infection awhile back  Answer Assessment - Initial Assessment Questions 1. ONSET: When did the swelling start? (e.g., minutes, hours, days)     Ongoing 2. LOCATION: What part of the leg is swollen?  Are both legs swollen or just one leg?     Both - more on the right 3. SEVERITY: How bad is the swelling? (e.g., localized; mild, moderate, severe)   - Localized: Small area of swelling localized to one leg.   - MILD pedal edema: Swelling limited to  foot and ankle, pitting edema < 1/4 inch (6 mm) deep, rest and elevation eliminate most or all swelling.   - MODERATE edema: Swelling of lower leg to knee, pitting edema > 1/4 inch (6 mm) deep, rest and elevation only partially reduce swelling.   - SEVERE edema: Swelling extends above knee, facial or hand swelling present.      moderate 4. REDNESS: Does the swelling look red or infected?     Right leg is red - pt thinks that may be from Sx 5. PAIN: Is the swelling painful to touch? If Yes, ask: How painful is it?   (Scale 1-10; mild, moderate or severe)     no 6. FEVER: Do you have a fever? If Yes, ask: What is it, how was it measured, and when did it start?      no 7. CAUSE: What do you think is  causing the leg swelling?     unsure 8. MEDICAL HISTORY: Do you have a history of blood clots (e.g., DVT), cancer, heart failure, kidney disease, or liver failure?     no 9. RECURRENT SYMPTOM: Have you had leg swelling before? If Yes, ask: When was the last time? What happened that time?     yes 10. OTHER SYMPTOMS: Do you have any other symptoms? (e.g., chest pain, difficulty breathing)       no  Protocols used: Leg or Foot Swelling-P-AH, Leg Swelling and Edema-A-AH

## 2024-04-16 NOTE — Telephone Encounter (Signed)
 See other note

## 2024-04-19 NOTE — Telephone Encounter (Signed)
 Left message on VM for pt with Dr Marval note. Advised him to call back and let us  know how he is taking the furosemide .

## 2024-04-19 NOTE — Telephone Encounter (Signed)
 Copied from CRM [id number]. Topic: Clinical - Medical Advice >> Apr 19, 2024  1:33 PM Stephen Bennett wrote: Reason for CRM: Patient is calling Clotilda back from phone call on 06/20. Patient is requesting a call back.

## 2024-04-28 ENCOUNTER — Ambulatory Visit: Admitting: Dermatology

## 2024-04-28 DIAGNOSIS — W908XXA Exposure to other nonionizing radiation, initial encounter: Secondary | ICD-10-CM

## 2024-04-28 DIAGNOSIS — C44629 Squamous cell carcinoma of skin of left upper limb, including shoulder: Secondary | ICD-10-CM

## 2024-04-28 DIAGNOSIS — D692 Other nonthrombocytopenic purpura: Secondary | ICD-10-CM | POA: Diagnosis not present

## 2024-04-28 DIAGNOSIS — L820 Inflamed seborrheic keratosis: Secondary | ICD-10-CM

## 2024-04-28 DIAGNOSIS — D492 Neoplasm of unspecified behavior of bone, soft tissue, and skin: Secondary | ICD-10-CM

## 2024-04-28 DIAGNOSIS — L578 Other skin changes due to chronic exposure to nonionizing radiation: Secondary | ICD-10-CM | POA: Diagnosis not present

## 2024-04-28 DIAGNOSIS — L570 Actinic keratosis: Secondary | ICD-10-CM | POA: Diagnosis not present

## 2024-04-28 DIAGNOSIS — L821 Other seborrheic keratosis: Secondary | ICD-10-CM | POA: Diagnosis not present

## 2024-04-28 DIAGNOSIS — D485 Neoplasm of uncertain behavior of skin: Secondary | ICD-10-CM

## 2024-04-28 NOTE — Progress Notes (Signed)
 Follow-Up Visit   Subjective  Stephen Alms. is a 83 y.o. male who presents for the following: Irregular, irritated skin lesion on the L elbow x 3 weeks, growing larger very fast. The patient has spots, moles and lesions to be evaluated, some may be new or changing and the patient may have concern these could be cancer.  The following portions of the chart were reviewed this encounter and updated as appropriate: medications, allergies, medical history  Review of Systems:  No other skin or systemic complaints except as noted in HPI or Assessment and Plan.  Objective  Well appearing patient in no apparent distress; mood and affect are within normal limits.   A focused examination was performed of the following areas: the face, scalp, arms, and legs   Relevant exam findings are noted in the Assessment and Plan.  L elbow 2.2 x 2.0 hyperkeratotic papule.  Face x 18 (18) Erythematous thin papules/macules with gritty scale.  R forearm x 1 Erythematous stuck-on, waxy papule or plaque  Assessment & Plan   NEOPLASM OF UNCERTAIN BEHAVIOR OF SKIN L elbow Epidermal / dermal shaving  Lesion diameter (cm):  2.2 Informed consent: discussed and consent obtained   Timeout: patient name, date of birth, surgical site, and procedure verified   Procedure prep:  Patient was prepped and draped in usual sterile fashion Prep type:  Isopropyl alcohol Anesthesia: the lesion was anesthetized in a standard fashion   Anesthetic:  1% lidocaine w/ epinephrine 1-100,000 buffered w/ 8.4% NaHCO3 Instrument used: flexible razor blade   Hemostasis achieved with: pressure, aluminum chloride and electrodesiccation   Outcome: patient tolerated procedure well   Post-procedure details: sterile dressing applied and wound care instructions given   Dressing type: bandage (Mupirocin  2% ointment)    Destruction of lesion Complexity: extensive   Destruction method: electrodesiccation and curettage   Informed  consent: discussed and consent obtained   Timeout:  patient name, date of birth, surgical site, and procedure verified Procedure prep:  Patient was prepped and draped in usual sterile fashion Prep type:  Isopropyl alcohol Anesthesia: the lesion was anesthetized in a standard fashion   Anesthetic:  1% lidocaine w/ epinephrine 1-100,000 buffered w/ 8.4% NaHCO3 Curettage performed in three different directions: Yes   Electrodesiccation performed over the curetted area: Yes   Lesion length (cm):  2.2 Lesion width (cm):  2 Margin per side (cm):  0.2 Final wound size (cm):  2.6 Hemostasis achieved with:  pressure, aluminum chloride and electrodesiccation Outcome: patient tolerated procedure well with no complications   Post-procedure details: sterile dressing applied and wound care instructions given   Dressing type: bandage and petrolatum    Specimen 1 - Surgical pathology Differential Diagnosis: D48.5 r/o SCC ED&C performed today Check Margins: No AK (ACTINIC KERATOSIS) (18) Face x 18 (18) Actinic keratoses are precancerous spots that appear secondary to cumulative UV radiation exposure/sun exposure over time. They are chronic with expected duration over 1 year. A portion of actinic keratoses will progress to squamous cell carcinoma of the skin. It is not possible to reliably predict which spots will progress to skin cancer and so treatment is recommended to prevent development of skin cancer.  Recommend daily broad spectrum sunscreen SPF 30+ to sun-exposed areas, reapply every 2 hours as needed.  Recommend staying in the shade or wearing long sleeves, sun glasses (UVA+UVB protection) and wide brim hats (4-inch brim around the entire circumference of the hat). Call for new or changing lesions.  Destruction of  lesion - Face x 18 (18) Complexity: simple   Destruction method: cryotherapy   Informed consent: discussed and consent obtained   Timeout:  patient name, date of birth, surgical  site, and procedure verified Lesion destroyed using liquid nitrogen: Yes   Region frozen until ice ball extended beyond lesion: Yes   Outcome: patient tolerated procedure well with no complications   Post-procedure details: wound care instructions given    INFLAMED SEBORRHEIC KERATOSIS R forearm x 1 Symptomatic, irritating, patient would like treated.  Destruction of lesion - R forearm x 1 Complexity: simple   Destruction method: cryotherapy   Informed consent: discussed and consent obtained   Timeout:  patient name, date of birth, surgical site, and procedure verified Lesion destroyed using liquid nitrogen: Yes   Region frozen until ice ball extended beyond lesion: Yes   Outcome: patient tolerated procedure well with no complications   Post-procedure details: wound care instructions given     ACTINIC DAMAGE - chronic, secondary to cumulative UV radiation exposure/sun exposure over time - diffuse scaly erythematous macules with underlying dyspigmentation - Recommend daily broad spectrum sunscreen SPF 30+ to sun-exposed areas, reapply every 2 hours as needed.  - Recommend staying in the shade or wearing long sleeves, sun glasses (UVA+UVB protection) and wide brim hats (4-inch brim around the entire circumference of the hat). - Call for new or changing lesions.  SEBORRHEIC KERATOSIS - Stuck-on, waxy, tan-brown papules and/or plaques  - Benign-appearing - Discussed benign etiology and prognosis. - Observe - Call for any changes  Purpura - Chronic; persistent and recurrent.  Treatable, but not curable. - Violaceous macules and patches - Benign - Related to trauma, age, sun damage and/or use of blood thinners, chronic use of topical and/or oral steroids - Observe - Can use OTC arnica containing moisturizer such as Dermend Bruise Formula if desired - Call for worsening or other concerns  Return in about 6 months (around 10/29/2024) for AK and skin CA follow up.  Stephen Bennett,  CMA, am acting as scribe for Alm Rhyme, MD .   Documentation: I have reviewed the above documentation for accuracy and completeness, and I agree with the above.  Alm Rhyme, MD

## 2024-04-28 NOTE — Patient Instructions (Signed)

## 2024-05-03 LAB — SURGICAL PATHOLOGY

## 2024-05-04 ENCOUNTER — Encounter: Payer: Self-pay | Admitting: Dermatology

## 2024-05-04 ENCOUNTER — Ambulatory Visit: Payer: Self-pay | Admitting: Dermatology

## 2024-05-04 NOTE — Telephone Encounter (Addendum)
 Called and discussed results with patient. He verbalized understanding and denied further questions. Will recheck at next follow up.  ----- Message from Alm Rhyme sent at 05/04/2024 12:40 PM EDT ----- FINAL DIAGNOSIS        1. Skin (M), L elbow :       MODERATELY DIFFERENTIATED SQUAMOUS CELL CARCINOMA   Cancer = SCC Moderately differentiated Already treated Recheck next visit ----- Message ----- From: Interface, Lab In Three [facility] One Sent: 05/04/2024  11:03 AM EDT To: Alm JAYSON Rhyme, MD

## 2024-05-06 ENCOUNTER — Telehealth: Payer: Self-pay | Admitting: Internal Medicine

## 2024-05-06 ENCOUNTER — Ambulatory Visit: Payer: Medicare Other | Admitting: Dermatology

## 2024-05-06 NOTE — Telephone Encounter (Signed)
 Patient needs to be rescheduled Dr Marval last day is August 28 he will be retiring   Copied from CRM [id number]. Topic: General - Call Back - No Documentation >> May 06, 2024  1:41 PM Stephen Bennett wrote: Reason for CRM: Patient stated he missed a call today from the office and agent could not locate what this might have been in regards too. Callback number [phone].

## 2024-05-07 ENCOUNTER — Other Ambulatory Visit: Payer: Self-pay | Admitting: Internal Medicine

## 2024-05-07 MED ORDER — TRAMADOL HCL 50 MG PO TABS: 50.0000 mg | ORAL_TABLET | Freq: Three times a day (TID) | ORAL | 0 refills | Status: DC | PRN

## 2024-05-07 NOTE — Telephone Encounter (Signed)
 Last filled 03-24-24 #60 Last OV 11-27-23 [facility]24 [facility] [address]

## 2024-05-07 NOTE — Telephone Encounter (Signed)
 Copied from CRM [id number]. Topic: Clinical - Medication Refill >> May 07, 2024  9:31 AM Rosina BIRCH wrote: Medication: traMADol  (ULTRAM ) 50 MG tablet  Has the patient contacted their pharmacy? No (Agent: If no, request that the patient contact the pharmacy for the refill. If patient does not wish to contact the pharmacy document the reason why and proceed with request.) (Agent: If yes, when and what did the pharmacy advise?)  This is the patient's preferred pharmacy:  [id number] [place], [place] - [address] [place] [address] Phone: [phone] Fax: [phone]  Is this the correct pharmacy for this prescription? Yes If no, delete pharmacy and type the correct one.   Has the prescription been filled recently? Yes  Is the patient out of the medication? Yes  Has the patient been seen for an appointment in the last year OR does the patient have an upcoming appointment? Yes  Can we respond through MyChart? No  Agent: Please be advised that Rx refills may take up to 3 business days. We ask that you follow-up with your pharmacy.

## 2024-05-13 ENCOUNTER — Ambulatory Visit: Admitting: Dermatology

## 2024-05-14 ENCOUNTER — Ambulatory Visit: Payer: Self-pay

## 2024-05-14 NOTE — Telephone Encounter (Signed)
 FYI Only or Action Required?: Action required by provider: update on patient condition and requesting medication .  Patient was last seen in primary care on 11/27/2023 by [name], MD.  Called Nurse Triage reporting Leg Swelling.  Symptoms began several days ago.  Interventions attempted: OTC medications: Lymph System Support, Prescription medications: Lasix , and Rest, hydration, or home remedies.  Symptoms are: gradually worsening.  Triage Disposition: See Physician Within 24 Hours  Patient/caregiver understands and will follow disposition?: No, refuses disposition                             Copied from CRM [id number]. Topic: Clinical - Red Word Triage >> May 14, 2024 12:17 PM Jasmin G wrote: [facility] that prompted transfer to Nurse Triage: Pt. Is experiencing swelling on right leg below knee, last Wednesday he noticed that he struggled putting his shoe on and took  HYDROcodone -acetaminophen  (NORCO/VICODIN) 5-325 MG tablet and that seemed to trigger the swelling even more, it is hard for pt. to walk atm. Reason for Disposition  [1] MODERATE leg swelling (e.g., swelling extends up to knees) AND [2] new-onset or getting worse  Answer Assessment - Initial Assessment Questions 1. ONSET: When did the swelling start? (e.g., minutes, hours, days)     A few days 2. LOCATION: What part of the leg is swollen?  Are both legs swollen or just one leg?     Right leg from knee down  3. SEVERITY: How bad is the swelling? (e.g., localized; mild, moderate, severe)     Localized 4. REDNESS: Is there redness or signs of infection?     Yes 5. PAIN: Is the swelling painful to touch? If Yes, ask: How painful is it?   (Scale 1-10; mild, moderate or severe)     Denies pain, generalized soreness 6. FEVER: Do you have a fever? If Yes, ask: What is it, how was it measured, and when did it start?      Denies 7. CAUSE: What do you think is causing the leg  swelling?     States he has had problems with retaining fluid in his legs in the past 8. MEDICAL HISTORY: Do you have a history of blood clots (e.g., DVT), cancer, heart failure, kidney disease, or liver failure?     Denies history of blood clots, denies HF 10. OTHER SYMPTOMS: Do you have any other symptoms? (e.g., chest pain, difficulty breathing)     Diarrhea, denies drainage, denies chest pain, denies difficulty breathing,     Takes furosemide  1-2x per day. First noticed swelling when his shoe did not fit. Advised patient to follow-up with provider in office. Patient declined appointment. Patient requesting provider to call in medication.  Protocols used: Leg Swelling and Edema-A-AH

## 2024-05-15 ENCOUNTER — Other Ambulatory Visit: Payer: Self-pay | Admitting: Internal Medicine

## 2024-05-15 MED ORDER — FUROSEMIDE 20 MG PO TABS: 20.0000 mg | ORAL_TABLET | Freq: Every day | ORAL | 0 refills | Status: DC | PRN

## 2024-05-15 NOTE — Telephone Encounter (Signed)
 See orders document

## 2024-05-15 NOTE — Progress Notes (Signed)
 Will reorder his furosemide ---I assume that is what he was asking for (please clarify on Monday)

## 2024-05-25 ENCOUNTER — Emergency Department
Admission: EM | Admit: 2024-05-25 | Discharge: 2024-05-25 | Disposition: A | Discharge status: Home / Self Care | Attending: Emergency Medicine | Admitting: Emergency Medicine

## 2024-05-25 ENCOUNTER — Ambulatory Visit: Payer: Self-pay

## 2024-05-25 DIAGNOSIS — R310 Gross hematuria: Secondary | ICD-10-CM

## 2024-05-25 DIAGNOSIS — R319 Hematuria, unspecified: Secondary | ICD-10-CM | POA: Diagnosis not present

## 2024-05-25 DIAGNOSIS — Z85828 Personal history of other malignant neoplasm of skin: Secondary | ICD-10-CM | POA: Diagnosis not present

## 2024-05-25 LAB — URINALYSIS, ROUTINE W REFLEX MICROSCOPIC
RBC / HPF: >50 RBC/hpf (ref 0–5)
Squamous Epithelial / HPF: 0 /HPF (ref 0–5)
WBC, UA: >50 WBC/hpf (ref 0–5)

## 2024-05-25 LAB — CBC
HCT: 29.1 % — ABNORMAL LOW (ref 39.0–52.0)
Hemoglobin: 9.6 g/dL — ABNORMAL LOW (ref 13.0–17.0)
MCH: 33.6 pg (ref 26.0–34.0)
MCHC: 33 g/dL (ref 30.0–36.0)
MCV: 101.7 fL — ABNORMAL HIGH (ref 80.0–100.0)
Platelets: 288 K/uL (ref 150–400)
RBC: 2.86 MIL/uL — ABNORMAL LOW (ref 4.22–5.81)
RDW: 24.4 % — ABNORMAL HIGH (ref 11.5–15.5)
WBC: 9.5 K/uL (ref 4.0–10.5)
nRBC: 0.2 % (ref 0.0–0.2)

## 2024-05-25 LAB — BASIC METABOLIC PANEL WITH GFR
Anion gap: 9 (ref 5–15)
BUN: 33 mg/dL — ABNORMAL HIGH (ref 8–23)
CO2: 29 mmol/L (ref 22–32)
Calcium: 9.4 mg/dL (ref 8.9–10.3)
Chloride: 99 mmol/L (ref 98–111)
Creatinine, Ser: 1.7 mg/dL — ABNORMAL HIGH (ref 0.61–1.24)
GFR, Estimated: 40 mL/min — ABNORMAL LOW (ref >60)
Glucose, Bld: 105 mg/dL — ABNORMAL HIGH (ref 70–99)
Potassium: 4.4 mmol/L (ref 3.5–5.1)
Sodium: 137 mmol/L (ref 135–145)

## 2024-05-25 NOTE — ED Notes (Signed)
Urinal at bedside for urine sample.

## 2024-05-25 NOTE — Discharge Instructions (Signed)
 Your blood tests today are okay.  Your urine test will be sent for culture to test for infection.  Please make an appointment to follow-up with urology for further evaluation of the blood in your urine.

## 2024-05-25 NOTE — Telephone Encounter (Signed)
 FYI Only or Action Required?: FYI only for provider.  Patient was last seen in primary care on 11/27/2023 by [name], MD.  Called Nurse Triage reporting Hematuria.  Symptoms began yesterday.  Interventions attempted: Rest, hydration, or home remedies.  Symptoms are: unchanged.  Triage Disposition: Go to ED Now (Notify PCP)  Patient/caregiver understands and will follow disposition?: Yes        Copied from CRM [id number]. Topic: Clinical - Red Word Triage >> May 25, 2024  3:59 PM Chasity T wrote: [facility] that prompted transfer to Nurse Triage: patient is stating blood in urine since last night. Reason for Disposition  Passing pure blood or large blood clots (i.e., size > a dime)  (Exception: Fleck or small strands.)  Answer Assessment - Initial Assessment Questions 1. COLOR of URINE: Describe the color of the urine.  (e.g., tea-colored, pink, red, bloody) Do you have blood clots in your urine? (e.g., none, pea, grape, small coin)     Red when he starts and then clears up  before he is done.  2. ONSET: When did the bleeding start?      Last night 3. EPISODES: How many times has there been blood in the urine? or How many times today?     Several times since he got up around 10 am. Maybe 8 times 4. PAIN with URINATION: Is there any pain with passing your urine? If Yes, ask: How bad is the pain?  (Scale 1-10; or mild, moderate, severe)     No pain 5. FEVER: Do you have a fever? If Yes, ask: What is your temperature, how was it measured, and when did it start?     No fever 6. ASSOCIATED SYMPTOMS: Are you passing urine more frequently than usual?     Going more frequent but also taking lasix  7. OTHER SYMPTOMS: Do you have any other symptoms? (e.g., back/flank pain, abdomen pain, vomiting)     Swelling in ankles,  Protocols used: Urine - Blood In-A-AH

## 2024-05-25 NOTE — ED Triage Notes (Addendum)
 Pt c/o dysuria and hematuria starting last night.  Pt noted to have blood on the front of his pants.   Pt was seen in February for similar.

## 2024-05-25 NOTE — ED Provider Notes (Signed)
 [facility] Provider Note    Event Date/Time   First MD Initiated Contact with Patient 05/25/24 1835     (approximate)   History   Chief Complaint: Hematuria   HPI  Stephen Bennett. is a 83 y.o. male with a history of skin cancer who comes to the ED complaining of hematuria.  Reports that starting today he has noticed frank blood in his urine, denies dysuria or abdominal pain or flank pain.  No fever or chills.  Reports this happened once before, and resolved on its own.  Denies taking blood thinners.        Past Medical History:  Diagnosis Date   Actinic keratosis 03/27/2023   left pretibial, LN2 04/15/23   Basal cell carcinoma 06/11/2018   L zygoma - excision 10/13/2018   BCC (basal cell carcinoma of skin) 06/11/2018   L cheek 5.0 cm ant to the earlobe   Compression fracture of fourth lumbar vertebra (HCC)    SCC (squamous cell carcinoma) 09/18/2023   left lateral leg ED&C done 09/18/23   SCC (squamous cell carcinoma) 04/28/2024   left elbow - treated with ED&C   Squamous cell carcinoma of skin 04/02/2018   R distal lat calf - ED&C   Squamous cell carcinoma of skin 01/31/2022   L med bicep - ED&C   Squamous cell carcinoma of skin 06/12/2022   R temple - ED&C   Squamous cell carcinoma of skin 07/03/2023   left medial calf, EDC   Squamous cell carcinoma of skin 07/03/2023   right medial calf, EDC    Current Outpatient Rx   Order #: [id number] Class: Historical Med   Order #: [id number] Class: Normal   Order #: [id number] Class: Historical Med   Order #: [id number] Class: Normal   Order #: [id number] Class: Normal   Order #: [id number] Class: Normal   Order #: [id number] Class: Historical Med   Order #: [id number] Class: Normal    Past Surgical History:  Procedure Laterality Date   SKIN CANCER EXCISION     several    Physical Exam   Triage Vital Signs: ED Triage Vitals  Encounter Vitals Group     BP 05/25/24 1828 122/72     Girls  Systolic BP Percentile --      Girls Diastolic BP Percentile --      Boys Systolic BP Percentile --      Boys Diastolic BP Percentile --      Pulse Rate 05/25/24 1828 73     Resp 05/25/24 1828 18     Temp 05/25/24 1828 97.6 F (36.4 C)     Temp Source 05/25/24 1828 Oral     SpO2 05/25/24 1828 98 %     Weight 05/25/24 1831 123 lb (55.8 kg)     Height 05/25/24 1831 5' 6 (1.676 m)     Head Circumference --      Peak Flow --      Pain Score 05/25/24 1829 4     Pain Loc --      Pain Education --      Exclude from Growth Chart --     Most recent vital signs: Vitals:   05/25/24 1828  BP: 122/72  Pulse: 73  Resp: 18  Temp: 97.6 F (36.4 C)  SpO2: 98%    General: Awake, no distress.  CV:  Good peripheral perfusion.  Regular rate and rhythm Resp:  Normal effort.  Abd:  No distention.  Soft nontender Other:  Moist oral mucosa  ED Results / Procedures / Treatments   Labs (all labs ordered are listed, but only abnormal results are displayed) Labs Reviewed  URINALYSIS, ROUTINE W REFLEX MICROSCOPIC - Abnormal; Notable for the following components:      Result Value   Color, Urine RED (*)    APPearance TURBID (*)    Glucose, UA   (*)    Value: TEST NOT REPORTED DUE TO COLOR INTERFERENCE OF URINE PIGMENT   Hgb urine dipstick   (*)    Value: TEST NOT REPORTED DUE TO COLOR INTERFERENCE OF URINE PIGMENT   Bilirubin Urine   (*)    Value: TEST NOT REPORTED DUE TO COLOR INTERFERENCE OF URINE PIGMENT   Ketones, ur   (*)    Value: TEST NOT REPORTED DUE TO COLOR INTERFERENCE OF URINE PIGMENT   Protein, ur   (*)    Value: TEST NOT REPORTED DUE TO COLOR INTERFERENCE OF URINE PIGMENT   Nitrite   (*)    Value: TEST NOT REPORTED DUE TO COLOR INTERFERENCE OF URINE PIGMENT   Leukocytes,Ua   (*)    Value: TEST NOT REPORTED DUE TO COLOR INTERFERENCE OF URINE PIGMENT   Bacteria, UA FEW (*)    All other components within normal limits  BASIC METABOLIC PANEL WITH GFR - Abnormal; Notable  for the following components:   Glucose, Bld 105 (*)    BUN 33 (*)    Creatinine, Ser 1.70 (*)    GFR, Estimated 40 (*)    All other components within normal limits  CBC - Abnormal; Notable for the following components:   RBC 2.86 (*)    Hemoglobin 9.6 (*)    HCT 29.1 (*)    MCV 101.7 (*)    RDW 24.4 (*)    All other components within normal limits     EKG    RADIOLOGY    PROCEDURES:  Procedures   MEDICATIONS ORDERED IN ED: Medications - No data to display   IMPRESSION / MDM / ASSESSMENT AND PLAN / ED COURSE  I reviewed the triage vital signs and the nursing notes.  DDx: UTI, AKI, anemia, thrombocytopenia  Patient's presentation is most consistent with acute presentation with potential threat to life or bodily function.  Patient presents with  hematuria without any associated pain or fever.  Had CT scan in February of this year which was unremarkable .  vital signs and exam are unremarkable, he is nontoxic.  Stable for discharge, recommend follow-up with urology       FINAL CLINICAL IMPRESSION(S) / ED DIAGNOSES   Final diagnoses:  Gross hematuria     Rx / DC Orders   ED Discharge Orders     None        Note:  This document was prepared using Dragon voice recognition software and may include unintentional dictation errors.   Viviann Pastor, MD 05/25/24 2030

## 2024-05-26 NOTE — Telephone Encounter (Signed)
 Left detailed message on VM per DPR with Dr Marval message. Reiterated that he needed to keep the appt he has scheduled with Dr Twylla on 05-28-24.

## 2024-05-26 NOTE — Addendum Note (Signed)
 Addended by: JIMMY ADE I on: 05/26/2024 07:32 AM   Modules accepted: Orders

## 2024-05-27 LAB — URINE CULTURE: Culture: NO GROWTH

## 2024-05-28 ENCOUNTER — Ambulatory Visit: Admitting: Urology

## 2024-05-29 ENCOUNTER — Other Ambulatory Visit: Payer: Self-pay

## 2024-05-29 ENCOUNTER — Emergency Department
Admission: EM | Admit: 2024-05-29 | Discharge: 2024-05-30 | Disposition: A | Discharge status: Home / Self Care | Source: Home / Self Care | Attending: Emergency Medicine | Admitting: Emergency Medicine

## 2024-05-29 DIAGNOSIS — N200 Calculus of kidney: Secondary | ICD-10-CM | POA: Diagnosis present

## 2024-05-29 DIAGNOSIS — R339 Retention of urine, unspecified: Secondary | ICD-10-CM | POA: Diagnosis not present

## 2024-05-29 DIAGNOSIS — N132 Hydronephrosis with renal and ureteral calculous obstruction: Secondary | ICD-10-CM | POA: Diagnosis not present

## 2024-05-29 DIAGNOSIS — F17210 Nicotine dependence, cigarettes, uncomplicated: Secondary | ICD-10-CM | POA: Diagnosis not present

## 2024-05-29 DIAGNOSIS — Z85828 Personal history of other malignant neoplasm of skin: Secondary | ICD-10-CM | POA: Diagnosis not present

## 2024-05-29 DIAGNOSIS — N400 Enlarged prostate without lower urinary tract symptoms: Secondary | ICD-10-CM | POA: Diagnosis present

## 2024-05-29 DIAGNOSIS — N739 Female pelvic inflammatory disease, unspecified: Secondary | ICD-10-CM | POA: Diagnosis not present

## 2024-05-29 DIAGNOSIS — R310 Gross hematuria: Secondary | ICD-10-CM | POA: Insufficient documentation

## 2024-05-29 DIAGNOSIS — K8020 Calculus of gallbladder without cholecystitis without obstruction: Secondary | ICD-10-CM | POA: Diagnosis not present

## 2024-05-29 DIAGNOSIS — N1330 Unspecified hydronephrosis: Secondary | ICD-10-CM | POA: Diagnosis not present

## 2024-05-29 DIAGNOSIS — Z809 Family history of malignant neoplasm, unspecified: Secondary | ICD-10-CM | POA: Diagnosis not present

## 2024-05-29 DIAGNOSIS — G894 Chronic pain syndrome: Secondary | ICD-10-CM | POA: Diagnosis present

## 2024-05-29 DIAGNOSIS — N136 Pyonephrosis: Secondary | ICD-10-CM | POA: Diagnosis not present

## 2024-05-29 DIAGNOSIS — M545 Low back pain, unspecified: Secondary | ICD-10-CM | POA: Diagnosis present

## 2024-05-29 DIAGNOSIS — N179 Acute kidney failure, unspecified: Secondary | ICD-10-CM | POA: Diagnosis present

## 2024-05-29 DIAGNOSIS — D62 Acute posthemorrhagic anemia: Secondary | ICD-10-CM | POA: Diagnosis present

## 2024-05-29 DIAGNOSIS — N134 Hydroureter: Secondary | ICD-10-CM | POA: Diagnosis not present

## 2024-05-29 DIAGNOSIS — Z01810 Encounter for preprocedural cardiovascular examination: Secondary | ICD-10-CM | POA: Diagnosis not present

## 2024-05-29 DIAGNOSIS — J449 Chronic obstructive pulmonary disease, unspecified: Secondary | ICD-10-CM | POA: Insufficient documentation

## 2024-05-29 DIAGNOSIS — D4959 Neoplasm of unspecified behavior of other genitourinary organ: Secondary | ICD-10-CM | POA: Diagnosis not present

## 2024-05-29 DIAGNOSIS — Z79899 Other long term (current) drug therapy: Secondary | ICD-10-CM | POA: Diagnosis not present

## 2024-05-29 DIAGNOSIS — Z87442 Personal history of urinary calculi: Secondary | ICD-10-CM | POA: Diagnosis not present

## 2024-05-29 DIAGNOSIS — R338 Other retention of urine: Secondary | ICD-10-CM | POA: Diagnosis not present

## 2024-05-29 DIAGNOSIS — N390 Urinary tract infection, site not specified: Secondary | ICD-10-CM | POA: Diagnosis not present

## 2024-05-29 DIAGNOSIS — D539 Nutritional anemia, unspecified: Secondary | ICD-10-CM | POA: Diagnosis present

## 2024-05-29 DIAGNOSIS — D469 Myelodysplastic syndrome, unspecified: Secondary | ICD-10-CM | POA: Diagnosis not present

## 2024-05-29 DIAGNOSIS — Z87448 Personal history of other diseases of urinary system: Secondary | ICD-10-CM | POA: Diagnosis not present

## 2024-05-29 DIAGNOSIS — R319 Hematuria, unspecified: Secondary | ICD-10-CM | POA: Diagnosis not present

## 2024-05-29 LAB — CBC WITH DIFFERENTIAL/PLATELET
Abs Immature Granulocytes: 0.04 K/uL (ref 0.00–0.07)
Basophils Absolute: 0.1 K/uL (ref 0.0–0.1)
Basophils Relative: 1 %
Eosinophils Absolute: 0 K/uL (ref 0.0–0.5)
Eosinophils Relative: 0 %
HCT: 29.4 % — ABNORMAL LOW (ref 39.0–52.0)
Hemoglobin: 9.5 g/dL — ABNORMAL LOW (ref 13.0–17.0)
Immature Granulocytes: 0 %
Lymphocytes Relative: 25 %
Lymphs Abs: 2.9 K/uL (ref 0.7–4.0)
MCH: 33.3 pg (ref 26.0–34.0)
MCHC: 32.3 g/dL (ref 30.0–36.0)
MCV: 103.2 fL — ABNORMAL HIGH (ref 80.0–100.0)
Monocytes Absolute: 1.1 K/uL — ABNORMAL HIGH (ref 0.1–1.0)
Monocytes Relative: 10 %
Neutro Abs: 7.3 K/uL (ref 1.7–7.7)
Neutrophils Relative %: 64 %
Platelets: 237 K/uL (ref 150–400)
RBC: 2.85 MIL/uL — ABNORMAL LOW (ref 4.22–5.81)
RDW: 24.7 % — ABNORMAL HIGH (ref 11.5–15.5)
WBC: 11.4 K/uL — ABNORMAL HIGH (ref 4.0–10.5)
nRBC: 0.2 % (ref 0.0–0.2)

## 2024-05-29 LAB — BASIC METABOLIC PANEL WITH GFR
Anion gap: 13 (ref 5–15)
BUN: 33 mg/dL — ABNORMAL HIGH (ref 8–23)
CO2: 24 mmol/L (ref 22–32)
Calcium: 9.1 mg/dL (ref 8.9–10.3)
Chloride: 101 mmol/L (ref 98–111)
Creatinine, Ser: 1.56 mg/dL — ABNORMAL HIGH (ref 0.61–1.24)
GFR, Estimated: 44 mL/min — ABNORMAL LOW (ref >60)
Glucose, Bld: 111 mg/dL — ABNORMAL HIGH (ref 70–99)
Potassium: 4.2 mmol/L (ref 3.5–5.1)
Sodium: 138 mmol/L (ref 135–145)

## 2024-05-29 LAB — URINALYSIS, ROUTINE W REFLEX MICROSCOPIC
RBC / HPF: >50 RBC/hpf (ref 0–5)
Squamous Epithelial / HPF: 0 /HPF (ref 0–5)
WBC, UA: >50 WBC/hpf (ref 0–5)

## 2024-05-29 MED ORDER — MORPHINE SULFATE (PF) 4 MG/ML IV SOLN
4.0000 mg | Freq: Once | INTRAVENOUS | Status: AC
  Administered 2024-05-29: 4 mg via INTRAVENOUS
  Filled 2024-05-29: qty 1

## 2024-05-29 MED ORDER — HYDROCODONE-ACETAMINOPHEN 5-325 MG PO TABS
1.0000 | ORAL_TABLET | Freq: Once | ORAL | Status: AC
  Administered 2024-05-29: 1 via ORAL
  Filled 2024-05-29: qty 1

## 2024-05-29 MED ORDER — CEPHALEXIN 500 MG PO CAPS: 500.0000 mg | ORAL_CAPSULE | Freq: Two times a day (BID) | ORAL | 0 refills | Status: DC

## 2024-05-29 MED ORDER — SODIUM CHLORIDE 0.9 % IV SOLN
1.0000 g | Freq: Once | INTRAVENOUS | Status: AC
  Administered 2024-05-29: 1 g via INTRAVENOUS
  Filled 2024-05-29: qty 10

## 2024-05-29 MED ORDER — SODIUM CHLORIDE 0.9 % IR SOLN
3000.0000 mL | Status: DC
  Administered 2024-05-29: 3000 mL

## 2024-05-29 NOTE — ED Triage Notes (Signed)
 Pt presented to ED with c/o dysuria and hematuria x 5 days. Seen on Tuesday for same. States urine is a dark red. States a stinging that started this evening. Denies thinners. Denies trauma.

## 2024-05-29 NOTE — ED Notes (Signed)
MD aware pt requesting more pain medication

## 2024-05-29 NOTE — ED Notes (Addendum)
 Jessup MD aware of bladder scan .

## 2024-05-29 NOTE — ED Notes (Signed)
 Jessup MD made aware pt requesting more pain medication

## 2024-05-29 NOTE — ED Provider Notes (Incomplete)
 Hematuria, receiving bladder irrigation. If clears, possibly discharge to home if doing well after 1-2 hours CBI. Started antibiotics and Rx provided for keflex  by Dr Willo. Has urology f/u already in place for same.  ----------------------------------------- 1:00 AM on 05/30/2024 ----------------------------------------- Patient has completed continuous bladder irrigation.  No further clots but has mild but clearing gross hematuria still present.  Discussed with the patient I recommended he be admitted due to concerns of ongoing hematuria, bleeding in the urine, need for further workup and potentially to continue with bladder irrigation.  Patient however declines admission.  Dr. Willo is also reported to me that he did offer the patient admission, and the patient had declined.  Patient understanding that he is to return right away if he cannot urinate, if he begins having fevers, severe pain, feels faint, has severe bleeding or other concerns.  He does report he has urology appointment coming up.  At this time patient alert will awake well oriented has excellent capacity, declines option for admission.  Patient understands that if he leaves and his bladder could become blocked could injure his kidneys or lead to worsening of his condition.  Patient declines admission reports feeling improved would like a dose of additional hydrocodone  as reports catheters causing discomfort in the bladder area.  He is awake alert fully oriented.  Quite hard of hearing.  I will discharge the patient, shared medical decision making, patient declining admission and given improvement in the hematuria we will remove Foley catheter and I have provided and discussed very careful return precautions for him.  He will return right away if weakness, nausea vomiting, fever inability urinate, abdominal pain or other concerns arise  Patient is not driving. Return precautions and treatment recommendations and follow-up discussed  with the patient who is agreeable with the plan.    Dicky Anes, MD 05/30/24 0106   Additionally, I did send an inbox message to Dr. Twylla requesting if possible the patient could be contacted for a more urgent appointment, informed of the patient coming to the ER twice and declining admission   Dicky Anes, MD 05/30/24 [phone]

## 2024-05-29 NOTE — ED Provider Notes (Signed)
 [facility] Provider Note    Event Date/Time   First MD Initiated Contact with Patient 05/29/24 2101     (approximate)   History   Chief Complaint Hematuria   HPI  Stephen Gerhold. is a 83 y.o. male with past medical history of myelodysplastic syndrome, COPD, and chronic pain syndrome who presents to the ED complaining of hematuria.  Patient reports that he has been dealing with blood in his urine for about the past 5 days, was seen in the ED earlier this week for similar symptoms with unremarkable workup.  He was discharged home with plan to follow-up with urology, states this is scheduled for 2 weeks from now.  He has continued to have hematuria since then, reports passing small clots and developing dysuria over the past 2 days.  He has not had any fevers, abdominal pain, or flank pain.  He does not take any blood thinners.     Physical Exam   Triage Vital Signs: ED Triage Vitals  Encounter Vitals Group     BP      Girls Systolic BP Percentile      Girls Diastolic BP Percentile      Boys Systolic BP Percentile      Boys Diastolic BP Percentile      Pulse      Resp      Temp      Temp src      SpO2      Weight      Height      Head Circumference      Peak Flow      Pain Score      Pain Loc      Pain Education      Exclude from Growth Chart     Most recent vital signs: Vitals:   05/29/24 2230 05/29/24 2300  BP: (!) 126/98 121/65  Pulse: 88 (!) 50  Resp:  18  Temp:    SpO2: 97% 98%    Constitutional: Alert and oriented. Eyes: Conjunctivae are normal. Head: Atraumatic. Nose: No congestion/rhinnorhea. Mouth/Throat: Mucous membranes are moist.  Cardiovascular: Normal rate, regular rhythm. Grossly normal heart sounds.  2+ radial pulses bilaterally. Respiratory: Normal respiratory effort.  No retractions. Lungs CTAB. Gastrointestinal: Soft and nontender. No distention. Musculoskeletal: No lower extremity tenderness nor edema.   Neurologic:  Normal speech and language. No gross focal neurologic deficits are appreciated.    ED Results / Procedures / Treatments   Labs (all labs ordered are listed, but only abnormal results are displayed) Labs Reviewed  CBC WITH DIFFERENTIAL/PLATELET - Abnormal; Notable for the following components:      Result Value   WBC 11.4 (*)    RBC 2.85 (*)    Hemoglobin 9.5 (*)    HCT 29.4 (*)    MCV 103.2 (*)    RDW 24.7 (*)    Monocytes Absolute 1.1 (*)    All other components within normal limits  BASIC METABOLIC PANEL WITH GFR - Abnormal; Notable for the following components:   Glucose, Bld 111 (*)    BUN 33 (*)    Creatinine, Ser 1.56 (*)    GFR, Estimated 44 (*)    All other components within normal limits  URINALYSIS, ROUTINE W REFLEX MICROSCOPIC - Abnormal; Notable for the following components:   Color, Urine RED (*)    APPearance TURBID (*)    Glucose, UA   (*)    Value: TEST NOT REPORTED  DUE TO COLOR INTERFERENCE OF URINE PIGMENT   Hgb urine dipstick   (*)    Value: TEST NOT REPORTED DUE TO COLOR INTERFERENCE OF URINE PIGMENT   Bilirubin Urine   (*)    Value: TEST NOT REPORTED DUE TO COLOR INTERFERENCE OF URINE PIGMENT   Ketones, ur   (*)    Value: TEST NOT REPORTED DUE TO COLOR INTERFERENCE OF URINE PIGMENT   Protein, ur   (*)    Value: TEST NOT REPORTED DUE TO COLOR INTERFERENCE OF URINE PIGMENT   Nitrite   (*)    Value: TEST NOT REPORTED DUE TO COLOR INTERFERENCE OF URINE PIGMENT   Leukocytes,Ua   (*)    Value: TEST NOT REPORTED DUE TO COLOR INTERFERENCE OF URINE PIGMENT   Bacteria, UA MANY (*)    Crystals PRESENT (*)    All other components within normal limits  URINE CULTURE    PROCEDURES:  Critical Care performed: No  Procedures   MEDICATIONS ORDERED IN ED: Medications  sodium chloride  irrigation 0.9 % 3,000 mL (3,000 mLs Irrigation New Bag/Given 05/29/24 2246)  cefTRIAXone  (ROCEPHIN ) 1 g in sodium chloride  0.9 % 100 mL IVPB (1 g Intravenous New  Bag/Given 05/29/24 2313)  HYDROcodone -acetaminophen  (NORCO/VICODIN) 5-325 MG per tablet 1 tablet (1 tablet Oral Given 05/29/24 2120)  morphine  (PF) 4 MG/ML injection 4 mg (4 mg Intravenous Given 05/29/24 2231)     IMPRESSION / MDM / ASSESSMENT AND PLAN / ED COURSE  I reviewed the triage vital signs and the nursing notes.                              83 y.o. male with past medical history of myelodysplastic syndrome, COPD, and chronic pain syndrome presents to the ED complaining of hematuria for the past 5 days now with dysuria over the past 2 days.  Patient's presentation is most consistent with acute presentation with potential threat to life or bodily function.  Differential diagnosis includes, but is not limited to, cystitis, pyelonephritis, kidney stone, malignancy, urinary obstruction.  Patient nontoxic-appearing and in no acute distress, vital signs are unremarkable.  He reports ongoing hematuria, has now had some dysuria and we will recheck urinalysis and send for culture.  No symptoms to suggest kidney stone or sepsis, patient does request pain medication to help with dysuria and we will give a dose of hydrocodone .  Initial bladder scan showed approximately 300 cc of urine, patient was then able to urinate about half of this, but has difficulty fully emptying his bladder with clots.  Three-way Foley was placed and we will begin CBI.  Labs are reassuring with stable chronic anemia and mild leukocytosis, renal function also stable compared to previous without acute electrolyte abnormality.  Urinalysis and urine culture are pending, given dysuria will treat with IV Rocephin .  Patient was offered admission to the hospital but prefers to go home if hematuria improves with CBI.  Patient turned over to 1 provider pending reassessment following additional bladder irrigation.      FINAL CLINICAL IMPRESSION(S) / ED DIAGNOSES   Final diagnoses:  Gross hematuria  Urinary retention     Rx / DC  Orders   ED Discharge Orders          Ordered    cephALEXin  (KEFLEX ) 500 MG capsule  2 times daily        05/29/24 2317  Note:  This document was prepared using Dragon voice recognition software and may include unintentional dictation errors.   Willo Dunnings, MD 05/29/24 [phone]

## 2024-05-30 ENCOUNTER — Other Ambulatory Visit: Payer: Self-pay

## 2024-05-30 ENCOUNTER — Inpatient Hospital Stay
Admission: EM | Admit: 2024-05-30 | Discharge: 2024-06-02 | DRG: 660 | Disposition: A | Discharge status: Home / Self Care | Attending: Internal Medicine | Admitting: Internal Medicine

## 2024-05-30 DIAGNOSIS — R310 Gross hematuria: Principal | ICD-10-CM | POA: Diagnosis present

## 2024-05-30 DIAGNOSIS — G894 Chronic pain syndrome: Secondary | ICD-10-CM | POA: Diagnosis present

## 2024-05-30 DIAGNOSIS — F17210 Nicotine dependence, cigarettes, uncomplicated: Secondary | ICD-10-CM | POA: Diagnosis present

## 2024-05-30 DIAGNOSIS — Z809 Family history of malignant neoplasm, unspecified: Secondary | ICD-10-CM

## 2024-05-30 DIAGNOSIS — Z87442 Personal history of urinary calculi: Secondary | ICD-10-CM

## 2024-05-30 DIAGNOSIS — D469 Myelodysplastic syndrome, unspecified: Secondary | ICD-10-CM | POA: Diagnosis present

## 2024-05-30 DIAGNOSIS — D62 Acute posthemorrhagic anemia: Principal | ICD-10-CM | POA: Diagnosis present

## 2024-05-30 DIAGNOSIS — N200 Calculus of kidney: Secondary | ICD-10-CM | POA: Diagnosis present

## 2024-05-30 DIAGNOSIS — F17200 Nicotine dependence, unspecified, uncomplicated: Secondary | ICD-10-CM | POA: Insufficient documentation

## 2024-05-30 DIAGNOSIS — E11621 Type 2 diabetes mellitus with foot ulcer: Secondary | ICD-10-CM | POA: Diagnosis present

## 2024-05-30 DIAGNOSIS — Z79899 Other long term (current) drug therapy: Secondary | ICD-10-CM

## 2024-05-30 DIAGNOSIS — M545 Low back pain, unspecified: Secondary | ICD-10-CM | POA: Diagnosis present

## 2024-05-30 DIAGNOSIS — D539 Nutritional anemia, unspecified: Secondary | ICD-10-CM | POA: Diagnosis present

## 2024-05-30 DIAGNOSIS — N136 Pyonephrosis: Principal | ICD-10-CM | POA: Diagnosis present

## 2024-05-30 DIAGNOSIS — N179 Acute kidney failure, unspecified: Secondary | ICD-10-CM | POA: Diagnosis present

## 2024-05-30 DIAGNOSIS — N400 Enlarged prostate without lower urinary tract symptoms: Secondary | ICD-10-CM | POA: Diagnosis present

## 2024-05-30 DIAGNOSIS — Z85828 Personal history of other malignant neoplasm of skin: Secondary | ICD-10-CM

## 2024-05-30 DIAGNOSIS — R339 Retention of urine, unspecified: Secondary | ICD-10-CM

## 2024-05-30 DIAGNOSIS — J449 Chronic obstructive pulmonary disease, unspecified: Secondary | ICD-10-CM | POA: Diagnosis present

## 2024-05-30 DIAGNOSIS — N390 Urinary tract infection, site not specified: Secondary | ICD-10-CM

## 2024-05-30 DIAGNOSIS — G8929 Other chronic pain: Secondary | ICD-10-CM | POA: Diagnosis present

## 2024-05-30 MED ORDER — HYDROCODONE-ACETAMINOPHEN 5-325 MG PO TABS
1.0000 | ORAL_TABLET | Freq: Once | ORAL | Status: AC
  Administered 2024-05-30: 1 via ORAL
  Filled 2024-05-30: qty 1

## 2024-05-30 NOTE — ED Notes (Signed)
 First nurse:  BIB [facility] from home for blood in urine. See here last week for same twice. No changes. Pt is alert and oriented.  Vitals : 93/42 95% RA  93 HR  97.8 T

## 2024-05-31 ENCOUNTER — Emergency Department

## 2024-05-31 ENCOUNTER — Observation Stay: Admitting: Anesthesiology

## 2024-05-31 ENCOUNTER — Encounter: Payer: Self-pay | Admitting: Internal Medicine

## 2024-05-31 ENCOUNTER — Observation Stay

## 2024-05-31 ENCOUNTER — Encounter
Admission: EM | Disposition: A | Discharge status: Home / Self Care | Payer: Self-pay | Source: Home / Self Care | Attending: Student in an Organized Health Care Education/Training Program

## 2024-05-31 DIAGNOSIS — N1330 Unspecified hydronephrosis: Secondary | ICD-10-CM | POA: Diagnosis not present

## 2024-05-31 DIAGNOSIS — D4959 Neoplasm of unspecified behavior of other genitourinary organ: Secondary | ICD-10-CM | POA: Diagnosis not present

## 2024-05-31 DIAGNOSIS — N739 Female pelvic inflammatory disease, unspecified: Secondary | ICD-10-CM | POA: Diagnosis not present

## 2024-05-31 DIAGNOSIS — D62 Acute posthemorrhagic anemia: Secondary | ICD-10-CM | POA: Insufficient documentation

## 2024-05-31 DIAGNOSIS — N179 Acute kidney failure, unspecified: Secondary | ICD-10-CM

## 2024-05-31 DIAGNOSIS — F17210 Nicotine dependence, cigarettes, uncomplicated: Secondary | ICD-10-CM

## 2024-05-31 DIAGNOSIS — R310 Gross hematuria: Secondary | ICD-10-CM

## 2024-05-31 DIAGNOSIS — R338 Other retention of urine: Secondary | ICD-10-CM

## 2024-05-31 DIAGNOSIS — F17200 Nicotine dependence, unspecified, uncomplicated: Secondary | ICD-10-CM | POA: Insufficient documentation

## 2024-05-31 DIAGNOSIS — Z01810 Encounter for preprocedural cardiovascular examination: Secondary | ICD-10-CM | POA: Diagnosis not present

## 2024-05-31 DIAGNOSIS — N200 Calculus of kidney: Secondary | ICD-10-CM

## 2024-05-31 HISTORY — PX: CYSTOSCOPY WITH BIOPSY: SHX5122

## 2024-05-31 HISTORY — PX: CYSTOSCOPY WITH FULGERATION: SHX6638

## 2024-05-31 HISTORY — PX: CYSTOSCOPY/URETEROSCOPY/HOLMIUM LASER: SHX6545

## 2024-05-31 HISTORY — PX: CYSTOSCOPY WITH STENT PLACEMENT: SHX5790

## 2024-05-31 LAB — BASIC METABOLIC PANEL WITH GFR
Anion gap: 11 (ref 5–15)
BUN: 43 mg/dL — ABNORMAL HIGH (ref 8–23)
CO2: 28 mmol/L (ref 22–32)
Calcium: 9.5 mg/dL (ref 8.9–10.3)
Chloride: 100 mmol/L (ref 98–111)
Creatinine, Ser: 2.2 mg/dL — ABNORMAL HIGH (ref 0.61–1.24)
GFR, Estimated: 29 mL/min — ABNORMAL LOW (ref >60)
Glucose, Bld: 141 mg/dL — ABNORMAL HIGH (ref 70–99)
Potassium: 4.5 mmol/L (ref 3.5–5.1)
Sodium: 139 mmol/L (ref 135–145)

## 2024-05-31 LAB — CBC
HCT: 26.9 % — ABNORMAL LOW (ref 39.0–52.0)
Hemoglobin: 8.6 g/dL — ABNORMAL LOW (ref 13.0–17.0)
MCH: 32.6 pg (ref 26.0–34.0)
MCHC: 32 g/dL (ref 30.0–36.0)
MCV: 101.9 fL — ABNORMAL HIGH (ref 80.0–100.0)
Platelets: 249 K/uL (ref 150–400)
RBC: 2.64 MIL/uL — ABNORMAL LOW (ref 4.22–5.81)
RDW: 25.1 % — ABNORMAL HIGH (ref 11.5–15.5)
WBC: 17.9 K/uL — AB (ref 4.0–10.5)
nRBC: 0.4 % — AB (ref 0.0–0.2)

## 2024-05-31 LAB — URINALYSIS, ROUTINE W REFLEX MICROSCOPIC
Bacteria, UA: NONE SEEN
RBC / HPF: >50 RBC/hpf (ref 0–5)
Squamous Epithelial / HPF: 0 /HPF (ref 0–5)
WBC, UA: >50 WBC/hpf (ref 0–5)

## 2024-05-31 LAB — HEMOGLOBIN
Hemoglobin: 6.8 g/dL — ABNORMAL LOW (ref 13.0–17.0)
Hemoglobin: 8.8 g/dL — ABNORMAL LOW (ref 13.0–17.0)

## 2024-05-31 LAB — ABO/RH: ABO/RH(D): O POS

## 2024-05-31 LAB — URINE CULTURE: Culture: <10000 — AB

## 2024-05-31 LAB — PREPARE RBC (CROSSMATCH)

## 2024-05-31 SURGERY — CYSTOSCOPY, WITH BLADDER FULGURATION
Anesthesia: General | Site: Ureter | Laterality: Right

## 2024-05-31 MED ORDER — PROPOFOL 500 MG/50ML IV EMUL
INTRAVENOUS | Status: DC | PRN
Start: 2024-05-31 — End: 2024-05-31
  Administered 2024-05-31: 125 ug/kg/min via INTRAVENOUS

## 2024-05-31 MED ORDER — HYOSCYAMINE SULFATE 0.125 MG PO TBDP
0.1250 mg | ORAL_TABLET | ORAL | Status: AC | PRN
  Administered 2024-05-31: 0.125 mg via ORAL
  Filled 2024-05-31: qty 1

## 2024-05-31 MED ORDER — DEXMEDETOMIDINE HCL IN NACL 80 MCG/20ML IV SOLN
INTRAVENOUS | Status: AC
  Filled 2024-05-31: qty 20

## 2024-05-31 MED ORDER — SODIUM CHLORIDE 0.9 % IV SOLN: 1.0000 g | INTRAVENOUS | Status: DC

## 2024-05-31 MED ORDER — IOHEXOL 300 MG/ML  SOLN
INTRAMUSCULAR | Status: DC | PRN
  Administered 2024-05-31: 20 mL via URETHRAL
  Administered 2024-05-31: 10 mL via URETHRAL

## 2024-05-31 MED ORDER — ACETAMINOPHEN 325 MG PO TABS: 650.0000 mg | ORAL_TABLET | Freq: Four times a day (QID) | ORAL | Status: DC | PRN

## 2024-05-31 MED ORDER — OXYCODONE HCL 5 MG PO TABS: 5.0000 mg | ORAL_TABLET | Freq: Once | ORAL | Status: DC | PRN

## 2024-05-31 MED ORDER — EPHEDRINE SULFATE-NACL 50-0.9 MG/10ML-% IV SOSY
PREFILLED_SYRINGE | INTRAVENOUS | Status: DC | PRN
  Administered 2024-05-31 (×2): 10 mg via INTRAVENOUS
  Administered 2024-05-31: 5 mg via INTRAVENOUS

## 2024-05-31 MED ORDER — OXYCODONE HCL 5 MG/5ML PO SOLN: 5.0000 mg | Freq: Once | ORAL | Status: DC | PRN

## 2024-05-31 MED ORDER — ONDANSETRON HCL 4 MG/2ML IJ SOLN: 4.0000 mg | Freq: Four times a day (QID) | INTRAMUSCULAR | Status: DC | PRN

## 2024-05-31 MED ORDER — FENTANYL CITRATE (PF) 100 MCG/2ML IJ SOLN: 25.0000 ug | INTRAMUSCULAR | Status: DC | PRN

## 2024-05-31 MED ORDER — MORPHINE SULFATE (PF) 2 MG/ML IV SOLN
2.0000 mg | INTRAVENOUS | Status: DC | PRN
  Administered 2024-05-31 – 2024-06-01 (×2): 2 mg via INTRAVENOUS
  Filled 2024-05-31 (×2): qty 1

## 2024-05-31 MED ORDER — LACTATED RINGERS IV BOLUS
1000.0000 mL | Freq: Once | INTRAVENOUS | Status: AC
  Administered 2024-05-31: 1000 mL via INTRAVENOUS

## 2024-05-31 MED ORDER — PROPOFOL 1000 MG/100ML IV EMUL
INTRAVENOUS | Status: AC
  Filled 2024-05-31: qty 100

## 2024-05-31 MED ORDER — SODIUM CHLORIDE 0.9 % IV SOLN
INTRAVENOUS | Status: DC | PRN
Start: 2024-05-31 — End: 2024-05-31

## 2024-05-31 MED ORDER — HYDROMORPHONE HCL 1 MG/ML IJ SOLN
INTRAMUSCULAR | Status: DC | PRN
  Administered 2024-05-31 (×2): .5 mg via INTRAVENOUS

## 2024-05-31 MED ORDER — SODIUM CHLORIDE 0.9 % IV SOLN
1.0000 g | INTRAVENOUS | Status: DC
  Administered 2024-05-31: 1 g via INTRAVENOUS
  Filled 2024-05-31: qty 10

## 2024-05-31 MED ORDER — HYDROMORPHONE HCL 1 MG/ML IJ SOLN
INTRAMUSCULAR | Status: AC
  Filled 2024-05-31: qty 1

## 2024-05-31 MED ORDER — SODIUM CHLORIDE 0.9% IV SOLUTION: Freq: Once | INTRAVENOUS | Status: AC

## 2024-05-31 MED ORDER — LIDOCAINE HCL URETHRAL/MUCOSAL 2 % EX GEL
1.0000 | Freq: Once | CUTANEOUS | Status: AC
  Administered 2024-05-31: 1 via URETHRAL
  Filled 2024-05-31: qty 10

## 2024-05-31 MED ORDER — MORPHINE SULFATE (PF) 2 MG/ML IV SOLN
2.0000 mg | Freq: Once | INTRAVENOUS | Status: AC
  Administered 2024-05-31: 2 mg via INTRAVENOUS
  Filled 2024-05-31: qty 1

## 2024-05-31 MED ORDER — ACETAMINOPHEN 650 MG RE SUPP: 650.0000 mg | Freq: Four times a day (QID) | RECTAL | Status: DC | PRN

## 2024-05-31 MED ORDER — DEXMEDETOMIDINE HCL IN NACL 80 MCG/20ML IV SOLN
INTRAVENOUS | Status: DC | PRN
  Administered 2024-05-31: 4 ug via INTRAVENOUS

## 2024-05-31 MED ORDER — ONDANSETRON HCL 4 MG PO TABS: 4.0000 mg | ORAL_TABLET | Freq: Four times a day (QID) | ORAL | Status: DC | PRN

## 2024-05-31 MED ORDER — HYDROCODONE-ACETAMINOPHEN 5-325 MG PO TABS
1.0000 | ORAL_TABLET | ORAL | Status: DC | PRN
  Administered 2024-05-31 – 2024-06-02 (×5): 1 via ORAL
  Filled 2024-05-31 (×5): qty 1

## 2024-05-31 MED ORDER — SODIUM CHLORIDE 0.9 % IR SOLN
Status: DC | PRN
  Administered 2024-05-31: 3500 mL via INTRAVESICAL
  Administered 2024-05-31: 3000 mL via INTRAVESICAL

## 2024-05-31 MED ORDER — SODIUM CHLORIDE 0.9 % IR SOLN
3000.0000 mL | Status: DC
  Administered 2024-05-31 (×4): 3000 mL

## 2024-05-31 MED ORDER — SODIUM CHLORIDE 0.9 % IV SOLN
1.0000 g | Freq: Once | INTRAVENOUS | Status: AC
  Administered 2024-05-31: 1 g via INTRAVENOUS
  Filled 2024-05-31: qty 10

## 2024-05-31 MED ORDER — NICOTINE 14 MG/24HR TD PT24
14.0000 mg | MEDICATED_PATCH | Freq: Every day | TRANSDERMAL | Status: DC
  Administered 2024-05-31 – 2024-06-01 (×2): 14 mg via TRANSDERMAL
  Filled 2024-05-31 (×3): qty 1

## 2024-05-31 MED ORDER — EPHEDRINE 5 MG/ML INJ
INTRAVENOUS | Status: AC
  Filled 2024-05-31: qty 5

## 2024-05-31 SURGICAL SUPPLY — 31 items
ADAPTER IRRIG TUBE 2 SPIKE SOL (ADAPTER) IMPLANT
BAG DRAIN SIEMENS DORNER NS (MISCELLANEOUS) ×3 IMPLANT
BAG URO DRAIN 4000ML (MISCELLANEOUS) IMPLANT
BASKET 3 PRONG GRASPER (BASKET) IMPLANT
BASKET ZERO TIP 1.9FR (BASKET) IMPLANT
CATH FOL 2WAY LX 20X30 (CATHETERS) IMPLANT
CATH URET FLEX-TIP 2 LUMEN 10F (CATHETERS) ×3 IMPLANT
CATH URETL OPEN END 4X70 (CATHETERS) IMPLANT
CATH URETL OPEN END 6FR 70 (CATHETERS) IMPLANT
CNTNR URN SCR LID CUP LEK RST (MISCELLANEOUS) IMPLANT
DRSG TELFA 3X4 N-ADH STERILE (GAUZE/BANDAGES/DRESSINGS) IMPLANT
ELECTRODE REM PT RTRN 9FT ADLT (ELECTROSURGICAL) ×3 IMPLANT
FIBER LASER MOSES 365 DFL (Laser) IMPLANT
FORCEPS BIOP PIRANHA Y (CUTTING FORCEPS) IMPLANT
GLOVE BIOGEL PI IND STRL 7.5 (GLOVE) ×3 IMPLANT
GOWN STRL REUS W/ TWL LRG LVL3 (GOWN DISPOSABLE) ×3 IMPLANT
GOWN STRL REUS W/ TWL XL LVL3 (GOWN DISPOSABLE) ×3 IMPLANT
GUIDEWIRE STR DUAL SENSOR (WIRE) ×6 IMPLANT
GUIDEWIRE STR ZIPWIRE 035X150 (MISCELLANEOUS) IMPLANT
NDL HYPO 25X1 1.5 SAFETY (NEEDLE) IMPLANT
NEEDLE HYPO 25X1 1.5 SAFETY (NEEDLE) ×3 IMPLANT
PACK CYSTO AR (MISCELLANEOUS) ×3 IMPLANT
SET CYSTO W/LG BORE CLAMP LF (SET/KITS/TRAYS/PACK) ×3 IMPLANT
SHEATH NAVIGATOR HD 12/14X36 (SHEATH) ×3 IMPLANT
SOL .9 NS 3000ML IRR UROMATIC (IV SOLUTION) ×3 IMPLANT
STENT URET 6FRX22 CONTOUR (STENTS) IMPLANT
SYRINGE TOOMEY IRRIG 70ML (MISCELLANEOUS) IMPLANT
VALVE UROSEAL ADJ ENDO (VALVE) IMPLANT
WATER STERILE IRR 1000ML POUR (IV SOLUTION) ×3 IMPLANT
WATER STERILE IRR 3000ML UROMA (IV SOLUTION) ×3 IMPLANT
WATER STERILE IRR 500ML POUR (IV SOLUTION) ×3 IMPLANT

## 2024-05-31 NOTE — Plan of Care (Signed)

## 2024-05-31 NOTE — Op Note (Signed)
 Preoperative diagnosis:  Gross hematuria with clot retention  Postoperative diagnosis:  Gross hematuria with clot retention Right distal ureteral tumor  Procedure: Cystoscopy with clot evacuation Right retrograde pyelogram with interpretation Right ureteroscopy with biopsy ureteral tumor Laser ablation right ureteral tumor (partial for hemostasis)  Surgeon: Glendia JAYSON Barba, MD  Anesthesia: MAC  Complications: None  Intraoperative findings:  Cystoscopy: Urethra normal in color without stricture.  Moderate lateral lobe enlargement prostate.  Mild erythema bladder base secondary to indwelling Foley.  No solid or papillary tumors.  Blood clot right ureteral orifice Right retrograde pyelogram: Filling defect right distal ureter measuring 10 x 25 mm.  No contrast seen proximal to the filling defect.  Proximal retrograde pyelogram with moderate hydronephrosis and hydroureter to the distal ureter Right ureteroscopy: Mixed papillary, solid ureter distal ureter  EBL: Minimal  Specimens:  Urine cytology right renal pelvis Biopsies right ureteral tumor  Indication: Stephen Bennett. is a 83 y.o. male with a several day history of recurrent gross hematuria.  Admitted early this morning with worsening hematuria and clot retention.  CT showed large amount of clot in the bladder and distal ureter with hydronephrosis to the distal ureter.  Prior CT February 2025 showed hydronephrosis with a dilated ureter to the distal portion.  After reviewing the management options for treatment, he elected to proceed with the above surgical procedure(s). We have discussed the potential benefits and risks of the procedure, side effects of the proposed treatment, the likelihood of the patient achieving the goals of the procedure, and any potential problems that might occur during the procedure or recuperation. Informed consent has been obtained.  Description of procedure:  The patient was taken to the  operating room and sedation was obtained by anesthesia.  The patient was placed in the dorsal lithotomy position, prepped and draped in the usual sterile fashion, and preoperative antibiotics were administered. A preoperative time-out was performed.   A 21 French cystoscope with 30 degree lens was lubricated, placed per urethra and advanced proximally into the bladder.  Visualization was suboptimal secondary to clot.  Several syringeful's of clot were removed from the bladder with a Toomey syringe until the effluent was clear.  Repeat cystoscopy was then performed with findings as described above.  A 0.038 Sensor wire was placed through the cystoscope and into the right UO however the wire would not advance beyond the distal ureter.  A 27F open-ended ureteral catheter was then placed to the cystoscope and into the distal ureter.  10 cc of Omnipaque  was injected with findings as described above.  A 0.035 Zip wire was placed through the ureteral catheter however would not advance beyond the distal ureter.  The cystoscope was removed and a 4.68F semirigid ureteroscope was placed per urethra and advanced into the bladder.  The scope was advanced into the distal ureter where a mixed solid and papillary tumor was identified as described above.  The tip of the scope was placed anteriorly and the Zip wire was easily advanced into the renal pelvis.  The ureteroscope was removed and the 27F catheter was advanced over the wire.  Urine was aspirated from the renal pelvis and sent for cytology.  Repeat retrograde pyelogram was again performed with findings as described above.  The Sensor wire was placed into the ureteral catheter followed by catheter removal.  The 4.68F semirigid ureteroscope was repassed and advanced into the right ureter.  Biopsies of the tumor were then performed with a 1.75F nitinol basket and  Piranha forceps.  Once adequate specimen was obtained a 365 m holmium laser fiber was placed through the  ureteroscope hemostasis was obtained at a setting of 1J/15 Hz.  The ureteroscope was then removed.  A 77F/22 cm Contour ureteral stent was then placed under fluoroscopic guidance.  A good curl was noted proximally and distally under fluoroscopy.  The cystoscope was repassed and no bleeding was noted from the right UO.  The bladder was again inspected and no mucosal abnormalities were identified.  The cystoscope was removed and a 82F Foley catheter was placed to gravity drainage with return of clear effluent.  He was then transported to the PACU in stable condition.  Plan: Foley catheter drainage DC Foley when urine clear-pink   Glendia JAYSON Barba, M.D.

## 2024-05-31 NOTE — Assessment & Plan Note (Signed)
 Chronic microcytic anemia Has followed with oncology-last visit 2023

## 2024-05-31 NOTE — Anesthesia Preprocedure Evaluation (Signed)
 Anesthesia Evaluation  Patient identified by MRN, date of birth, ID band Patient awake    Reviewed: Allergy & Precautions, NPO status , Patient's Chart, lab work & pertinent test results  History of Anesthesia Complications Negative for: history of anesthetic complications  Airway Mallampati: III  TM Distance: >3 FB Neck ROM: full    Dental  (+) Edentulous Upper, Edentulous Lower   Pulmonary COPD,  COPD inhaler, Current SmokerPatient did not abstain from smoking.   Pulmonary exam normal        Cardiovascular negative cardio ROS Normal cardiovascular exam     Neuro/Psych  Neuromuscular disease  negative psych ROS   GI/Hepatic negative GI ROS, Neg liver ROS,,,  Endo/Other  negative endocrine ROS    Renal/GU Renal disease  negative genitourinary   Musculoskeletal   Abdominal   Peds  Hematology  (+) Blood dyscrasia, anemia   Anesthesia Other Findings Past Medical History: 03/27/2023: Actinic keratosis     Comment:  left pretibial, LN2 04/15/23 06/11/2018: Basal cell carcinoma     Comment:  L zygoma - excision 10/13/2018 06/11/2018: BCC (basal cell carcinoma of skin)     Comment:  L cheek 5.0 cm ant to the earlobe No date: Compression fracture of fourth lumbar vertebra (HCC) 09/18/2023: SCC (squamous cell carcinoma)     Comment:  left lateral leg ED&C done 09/18/23 04/28/2024: SCC (squamous cell carcinoma)     Comment:  left elbow - treated with [facility] 04/02/2018: Squamous cell carcinoma of skin     Comment:  R distal lat calf - ED&C 01/31/2022: Squamous cell carcinoma of skin     Comment:  L med bicep - ED&C 06/12/2022: Squamous cell carcinoma of skin     Comment:  R temple - ED&C 07/03/2023: Squamous cell carcinoma of skin     Comment:  left medial calf, EDC 07/03/2023: Squamous cell carcinoma of skin     Comment:  right medial calf, EDC  Past Surgical History: No date: SKIN CANCER EXCISION     Comment:   several  BMI    Body Mass Index: 18.36 kg/m      Reproductive/Obstetrics negative OB ROS                              Anesthesia Physical Anesthesia Plan  ASA: 3  Anesthesia Plan: General   Post-op Pain Management: Tylenol  PO (pre-op)*   Induction: Intravenous  PONV Risk Score and Plan: 2 and Propofol  infusion and TIVA  Airway Management Planned: Natural Airway and Nasal Cannula  Additional Equipment:   Intra-op Plan:   Post-operative Plan:   Informed Consent: I have reviewed the patients History and Physical, chart, labs and discussed the procedure including the risks, benefits and alternatives for the proposed anesthesia with the patient or authorized representative who has indicated his/her understanding and acceptance.     Dental Advisory Given  Plan Discussed with: Anesthesiologist, CRNA and Surgeon  Anesthesia Plan Comments: (Patient consented for risks of anesthesia including but not limited to:  - adverse reactions to medications - risk of airway placement if required - damage to eyes, teeth, lips or other oral mucosa - nerve damage due to positioning  - sore throat or hoarseness - Damage to heart, brain, nerves, lungs, other parts of body or loss of life  Patient voiced understanding and assent.)         Anesthesia Quick Evaluation

## 2024-05-31 NOTE — Assessment & Plan Note (Deleted)
 Chronic anemia secondary to MGUS Hemoglobin 8.6, down from 9.5 a couple days prior Serial H&H and transfuse if needed

## 2024-05-31 NOTE — H&P (Addendum)
 History and Physical    Patient: Stephen Bennett. FMW:[id number] DOB: [birth year] DOA: 05/30/2024 DOS: the patient was seen and examined on 05/31/2024 PCP: [name], MD  Patient coming from: Home  Chief Complaint:  Chief Complaint  Patient presents with   Hematuria    HPI: Stephen Bennett. is a 83 y.o. male with medical history significant for Chronic macrocytic anemia attributed to MDS, chronic back pain, tobacco use disorder, right renal calculus with hydronephrosis symptomatic for intermittent hematuria and right flank pain since 11/2023 not yet followed by urology, being admitted for gross hematuria, persistent in spite of CBI during a prior ED visit ton 05/29/2024 when he declined hospitalization.  He reports that he has burning at the tip of his penis and feels like he is unable to empty his bladder.  Denies abdominal pain, fever or chills. In the ED, BP 102/59 with otherwise normal vitals. Labs notable for WBC 18,000 and WBC 8.6, down from baseline of 9.5 on his earlier visit on 8/2.  Creatinine 2.2 above baseline around 1.28 in the past 6 months.  Urinalysis showing red turbid urine without bacteria or WBCs. CT renal stone study pending-> Mild left-sided hydronephrosis and moderate to severe right hydronephrosis and hydroureter, down to the level of the bladder. Catheter in the bladder with partially decompressed bladder; the bladder is filled with hyperdense material, likely blood products/hematoma. There is reflux of hyperdense material into the dilated distal right ureter. Underlying mass not excluded  Patient started on CBI, given a LR bolus and morphine . Admission requested.     Review of Systems: As mentioned in the history of present illness. All other systems reviewed and are negative.  Past Medical History:  Diagnosis Date   Actinic keratosis 03/27/2023   left pretibial, LN2 04/15/23   Basal cell carcinoma 06/11/2018   L zygoma - excision 10/13/2018    BCC (basal cell carcinoma of skin) 06/11/2018   L cheek 5.0 cm ant to the earlobe   Compression fracture of fourth lumbar vertebra (HCC)    SCC (squamous cell carcinoma) 09/18/2023   left lateral leg ED&C done 09/18/23   SCC (squamous cell carcinoma) 04/28/2024   left elbow - treated with ED&C   Squamous cell carcinoma of skin 04/02/2018   R distal lat calf - ED&C   Squamous cell carcinoma of skin 01/31/2022   L med bicep - ED&C   Squamous cell carcinoma of skin 06/12/2022   R temple - ED&C   Squamous cell carcinoma of skin 07/03/2023   left medial calf, EDC   Squamous cell carcinoma of skin 07/03/2023   right medial calf, EDC   Past Surgical History:  Procedure Laterality Date   SKIN CANCER EXCISION     several   Social History:  reports that he has been smoking cigarettes. He has a 31.5 pack-year smoking history. He has been exposed to tobacco smoke. He has never used smokeless tobacco. He reports that he does not currently use alcohol. He reports that he does not currently use drugs.  No Known Allergies  Family History  Problem Relation Age of Onset   Cancer Mother    Heart disease Neg Hx    Diabetes Neg Hx     Prior to Admission medications   Medication Sig Start Date End Date Taking? Authorizing Provider  calcium carbonate (TUMS - DOSED IN MG ELEMENTAL CALCIUM) 500 MG chewable tablet Chew 2-3 tablets by mouth daily.    [provider]  cephALEXin  (KEFLEX ) 500 MG capsule Take 1 capsule (500 mg total) by mouth 2 (two) times daily for 7 days. 05/29/24 06/05/24  Willo Dunnings, MD  cholecalciferol (VITAMIN D) 1000 units tablet Take 1,000 Units by mouth daily.    [provider]  furosemide  (LASIX ) 20 MG tablet Take 1-2 tablets (20-40 mg total) by mouth daily as needed. For increased morning leg swelling 05/15/24   [name], MD  HYDROcodone -acetaminophen  (NORCO/VICODIN) 5-325 MG tablet Take 1 tablet by mouth every 4 (four) hours as needed. 02/09/24    Letvak, Richard I, MD  mupirocin  ointment (BACTROBAN ) 2 % Apply to healing wound QD. 10/09/23   Hester Alm BROCKS, MD  nicotine  (NICOTROL ) 10 MG inhaler Inhale 1 continuous puffing into the lungs as needed for smoking cessation.    [provider]  traMADol  (ULTRAM ) 50 MG tablet Take 1 tablet (50 mg total) by mouth 3 (three) times daily as needed. 05/07/24   [name], MD    Physical Exam: Vitals:   05/30/24 2332 05/30/24 2334 05/31/24 0100 05/31/24 0118  BP:  (!) 102/59 (!) 105/58   Pulse:  94  87  Resp:  16    Temp:  98.2 F (36.8 C)    SpO2:  94%  99%  Height: 5' 6 (1.676 m)      Physical Exam Vitals and nursing note reviewed.  Constitutional:      General: He is not in acute distress.    Comments: Thin male, in no distress  HENT:     Head: Normocephalic and atraumatic.  Cardiovascular:     Rate and Rhythm: Normal rate and regular rhythm.     Heart sounds: Normal heart sounds.  Pulmonary:     Effort: Pulmonary effort is normal.     Breath sounds: Normal breath sounds.  Abdominal:     Palpations: Abdomen is soft.     Tenderness: There is no abdominal tenderness.  Neurological:     Mental Status: Mental status is at baseline.     Labs on Admission: I have personally reviewed following labs and imaging studies  CBC: Recent Labs  Lab 05/25/24 1833 05/29/24 2113 05/30/24 2343  WBC 9.5 11.4* 17.9*  NEUTROABS  --  7.3  --   HGB 9.6* 9.5* 8.6*  HCT 29.1* 29.4* 26.9*  MCV 101.7* 103.2* 101.9*  PLT 288 237 249   Basic Metabolic Panel: Recent Labs  Lab 05/25/24 1833 05/29/24 2113 05/30/24 2343  NA 137 138 139  K 4.4 4.2 4.5  CL 99 101 100  CO2 29 24 28   GLUCOSE 105* 111* 141*  BUN 33* 33* 43*  CREATININE 1.70* 1.56* 2.20*  CALCIUM 9.4 9.1 9.5   GFR: Estimated Creatinine Clearance: 20.1 mL/min (A) (by C-G formula based on SCr of 2.2 mg/dL (H)). Liver Function Tests: No results for input(s): AST, ALT, ALKPHOS, BILITOT, PROT,  ALBUMIN in the last 168 hours. No results for input(s): LIPASE, AMYLASE in the last 168 hours. No results for input(s): AMMONIA in the last 168 hours. Coagulation Profile: No results for input(s): INR, PROTIME in the last 168 hours. Cardiac Enzymes: No results for input(s): CKTOTAL, CKMB, CKMBINDEX, TROPONINI in the last 168 hours. BNP (last 3 results) No results for input(s): PROBNP in the last 8760 hours. HbA1C: No results for input(s): HGBA1C in the last 72 hours. CBG: No results for input(s): GLUCAP in the last 168 hours. Lipid Profile: No results for input(s): CHOL, HDL, LDLCALC, TRIG, CHOLHDL, LDLDIRECT in the  last 72 hours. Thyroid  Function Tests: No results for input(s): TSH, T4TOTAL, FREET4, T3FREE, THYROIDAB in the last 72 hours. Anemia Panel: No results for input(s): VITAMINB12, FOLATE, FERRITIN, TIBC, IRON, RETICCTPCT in the last 72 hours. Urine analysis:    Component Value Date/Time   COLORURINE RED (A) 05/30/2024 2333   APPEARANCEUR TURBID (A) 05/30/2024 2333   LABSPEC  05/30/2024 2333    TEST NOT REPORTED DUE TO COLOR INTERFERENCE OF URINE PIGMENT   PHURINE  05/30/2024 2333    TEST NOT REPORTED DUE TO COLOR INTERFERENCE OF URINE PIGMENT   GLUCOSEU (A) 05/30/2024 2333    TEST NOT REPORTED DUE TO COLOR INTERFERENCE OF URINE PIGMENT   HGBUR (A) 05/30/2024 2333    TEST NOT REPORTED DUE TO COLOR INTERFERENCE OF URINE PIGMENT   BILIRUBINUR (A) 05/30/2024 2333    TEST NOT REPORTED DUE TO COLOR INTERFERENCE OF URINE PIGMENT   BILIRUBINUR negative 11/27/2023 1448   KETONESUR (A) 05/30/2024 2333    TEST NOT REPORTED DUE TO COLOR INTERFERENCE OF URINE PIGMENT   PROTEINUR (A) 05/30/2024 2333    TEST NOT REPORTED DUE TO COLOR INTERFERENCE OF URINE PIGMENT   UROBILINOGEN 0.2 11/27/2023 1448   NITRITE (A) 05/30/2024 2333    TEST NOT REPORTED DUE TO COLOR INTERFERENCE OF URINE PIGMENT   LEUKOCYTESUR (A) 05/30/2024  2333    TEST NOT REPORTED DUE TO COLOR INTERFERENCE OF URINE PIGMENT    Radiological Exams on Admission: No results found. Data Reviewed for HPI: Relevant notes from primary care and specialist visits, past discharge summaries as available in EHR, including Care Everywhere. Prior diagnostic testing as pertinent to current admission diagnoses Updated medications and problem lists for reconciliation ED course, including vitals, labs, imaging, treatment and response to treatment Triage notes, nursing and pharmacy notes and ED provider's notes Notable results as noted above in HPI      Assessment and Plan: * Gross hematuria Acute blood loss anemia Bilateral hydronephrosis History of right nephrolithiasis with hydronephrosis 11/2023 Continue CBI Serial H&H and transfuse if needed Pain control Follow-up urine culture to evaluate for UTI Urology consult: Per Dr Watt (addendum) He probably needs hand irrigation over CBI.  Unless he has already been irrigated free of clot.  There is too much clot in the bladder for the CBI to be of any help and it is more likely to cause problems.  --Manual irrigation ordered Keep n.p.o. in case of procedure - Dr Watt advised will be signing out to Dr Twylla SCD for DVT prophylaxis  AKI (acute kidney injury) (HCC) Creatinine 2.2, up from 0.68 about 6 months prior Secondary to obstructive uropathy Expecting improvement with clearance of clot in bladder and urologic intervention  Tobacco use disorder Nicotine  patch if agreeable  Myelodysplasia (myelodysplastic syndrome) (HCC) Chronic microcytic anemia Has followed with oncology-last visit 2023  Chronic low back pain On as needed opiates hydrocodone /tramadol         DVT prophylaxis: SCD  Consults: Urology, Dr. Watt  Advance Care Planning: full code  Family Communication: none  Disposition Plan: Back to previous home environment  Severity of Illness: The appropriate patient  status for this patient is OBSERVATION. Observation status is judged to be reasonable and necessary in order to provide the required intensity of service to ensure the patient's safety. The patient's presenting symptoms, physical exam findings, and initial radiographic and laboratory data in the context of their medical condition is felt to place them at decreased risk for further clinical deterioration. Furthermore, it is  anticipated that the patient will be medically stable for discharge from the hospital within 2 midnights of admission.   Author: Delayne LULLA Solian, MD 05/31/2024 1:29 AM  For on call review [email or web address].

## 2024-05-31 NOTE — Hospital Course (Signed)
 SABRA

## 2024-05-31 NOTE — Transfer of Care (Signed)
 Immediate Anesthesia Transfer of Care Note  Patient: Stephen Bennett.  Procedure(s) Performed: CYSTOSCOPY, WITH URETERAL FULGURATION CYSTOSCOPY, WITH BIOPSY (Right) CYSTOSCOPY, WITH STENT INSERTION (Right: Ureter)  Patient Location: PACU  Anesthesia Type:General  Level of Consciousness: sedated  Airway & Oxygen Therapy: Patient Spontanous Breathing and Patient connected to nasal cannula oxygen  Post-op Assessment: Report given to RN and Post -op Vital signs reviewed and stable  Post vital signs: Reviewed and stable  Last Vitals:  Vitals Value Taken Time  BP 114/68 05/31/24 18:39  Temp    Pulse 36 05/31/24 18:40  Resp 7 05/31/24 18:40  SpO2 100 % 05/31/24 18:40  Vitals shown include unfiled device data.  Last Pain:  Vitals:   05/31/24 1415  TempSrc: Oral  PainSc:          Complications: No notable events documented.

## 2024-05-31 NOTE — Consult Note (Signed)
 Urology Consult  I have been asked to see the patient by Dr. Lanetta, for evaluation and management of gross hematuria and hydronephrosis.  Chief Complaint: Gross hematuria  History of Present Illness: Stephen Skora. is a 83 y.o. year old male with PMH chronic macrocytic anemia due to MDS, left renal stone, and a recent history of intermittent hematuria, hydronephrosis, and right flank pain since February admitted overnight with recurrent hematuria and difficulty voiding.  CT stone study on presentation notable for mild left hydronephrosis and moderate to severe right hydroureteronephrosis to the level of a partially decompressed bladder with Foley catheter in place.  There are blood products within the bladder refluxing up the right ureter.  Prostate is enlarged.  Admission labs notable for UA with 50 RBC/hpf, 50 WBC/hpf, and no bacteria; urine culture pending, on antibiotics as below; creatinine 2.20 (baseline unclear, possibly 1.3-1.5); white count 17.9; and hemoglobin 8.6.  He reports an extensive, approximate 100-pack-year smoking history, though he admits that he does not inhale anymore like he used to.  He has been referred to our practice several times for his recurrent gross hematuria since February, though he has canceled these visits.  Foley catheter in place draining clear, red urine on slow drip CBI.  He has been n.p.o. since at least 2 AM.  Anti-infectives (From admission, onward)    Start     Dose/Rate Route Frequency Ordered Stop   05/31/24 0730  cefTRIAXone  (ROCEPHIN ) 1 g in sodium chloride  0.9 % 100 mL IVPB  Status:  Discontinued        1 g 200 mL/hr over 30 Minutes Intravenous Every 24 hours 05/31/24 0644 05/31/24 0644   05/31/24 0045  cefTRIAXone  (ROCEPHIN ) 1 g in sodium chloride  0.9 % 100 mL IVPB        1 g 200 mL/hr over 30 Minutes Intravenous  Once 05/31/24 0043 05/31/24 9787       Past Medical History:  Diagnosis Date   Actinic keratosis 03/27/2023    left pretibial, LN2 04/15/23   Basal cell carcinoma 06/11/2018   L zygoma - excision 10/13/2018   BCC (basal cell carcinoma of skin) 06/11/2018   L cheek 5.0 cm ant to the earlobe   Compression fracture of fourth lumbar vertebra (HCC)    SCC (squamous cell carcinoma) 09/18/2023   left lateral leg ED&C done 09/18/23   SCC (squamous cell carcinoma) 04/28/2024   left elbow - treated with ED&C   Squamous cell carcinoma of skin 04/02/2018   R distal lat calf - ED&C   Squamous cell carcinoma of skin 01/31/2022   L med bicep - ED&C   Squamous cell carcinoma of skin 06/12/2022   R temple - ED&C   Squamous cell carcinoma of skin 07/03/2023   left medial calf, EDC   Squamous cell carcinoma of skin 07/03/2023   right medial calf, EDC    Past Surgical History:  Procedure Laterality Date   SKIN CANCER EXCISION     several    Home Medications:  Current Meds  Medication Sig   cephALEXin  (KEFLEX ) 500 MG capsule Take 1 capsule (500 mg total) by mouth 2 (two) times daily for 7 days.   furosemide  (LASIX ) 20 MG tablet Take 1-2 tablets (20-40 mg total) by mouth daily as needed. For increased morning leg swelling    Allergies: No Known Allergies  Family History  Problem Relation Age of Onset   Cancer Mother    Heart disease Neg Hx  Diabetes Neg Hx     Social History:  reports that he has been smoking cigarettes. He has a 31.5 pack-year smoking history. He has been exposed to tobacco smoke. He has never used smokeless tobacco. He reports that he does not currently use alcohol. He reports that he does not currently use drugs.  ROS: A complete review of systems was performed.  All systems are negative except for pertinent findings as noted.  Physical Exam:  Vital signs in last 24 hours: Temp:  [97.8 F (36.6 C)-98.4 F (36.9 C)] 97.8 F (36.6 C) (08/04 0823) Pulse Rate:  [57-94] 64 (08/04 0823) Resp:  [16-18] 17 (08/04 0823) BP: (102-127)/(58-64) 103/64 (08/04 0823) SpO2:  [94  %-100 %] 98 % (08/04 0823) Weight:  [51.6 kg] 51.6 kg (08/04 0301) Constitutional:  Alert, frail appearing, no acute distress HEENT: [place] AT, moist mucus membranes Cardiovascular: No clubbing, cyanosis, or edema Respiratory: Normal respiratory effort Skin: No rashes, bruises or suspicious lesions Neurologic: Grossly intact, no focal deficits, moving all 4 extremities Psychiatric: Normal mood and affect  Laboratory Data:  Recent Labs    05/29/24 2113 05/30/24 2343 05/31/24 0538  WBC 11.4* 17.9*  --   HGB 9.5* 8.6* 6.8*  HCT 29.4* 26.9*  --    Recent Labs    05/29/24 2113 05/30/24 2343  NA 138 139  K 4.2 4.5  CL 101 100  CO2 24 28  GLUCOSE 111* 141*  BUN 33* 43*  CREATININE 1.56* 2.20*  CALCIUM 9.1 9.5   Urinalysis    Component Value Date/Time   COLORURINE RED (A) 05/30/2024 2333   APPEARANCEUR TURBID (A) 05/30/2024 2333   LABSPEC  05/30/2024 2333    TEST NOT REPORTED DUE TO COLOR INTERFERENCE OF URINE PIGMENT   PHURINE  05/30/2024 2333    TEST NOT REPORTED DUE TO COLOR INTERFERENCE OF URINE PIGMENT   GLUCOSEU (A) 05/30/2024 2333    TEST NOT REPORTED DUE TO COLOR INTERFERENCE OF URINE PIGMENT   HGBUR (A) 05/30/2024 2333    TEST NOT REPORTED DUE TO COLOR INTERFERENCE OF URINE PIGMENT   BILIRUBINUR (A) 05/30/2024 2333    TEST NOT REPORTED DUE TO COLOR INTERFERENCE OF URINE PIGMENT   BILIRUBINUR negative 11/27/2023 1448   KETONESUR (A) 05/30/2024 2333    TEST NOT REPORTED DUE TO COLOR INTERFERENCE OF URINE PIGMENT   PROTEINUR (A) 05/30/2024 2333    TEST NOT REPORTED DUE TO COLOR INTERFERENCE OF URINE PIGMENT   UROBILINOGEN 0.2 11/27/2023 1448   NITRITE (A) 05/30/2024 2333    TEST NOT REPORTED DUE TO COLOR INTERFERENCE OF URINE PIGMENT   LEUKOCYTESUR (A) 05/30/2024 2333    TEST NOT REPORTED DUE TO COLOR INTERFERENCE OF URINE PIGMENT   Results for orders placed or performed during the hospital encounter of 05/25/24  Urine Culture     Status: None   Collection Time:  05/25/24  7:12 PM   Specimen: Urine, Clean Catch  Result Value Ref Range Status   Specimen Description   Final    URINE, CLEAN CATCH Performed at [facility], [address] [place], [place] [address]    Special Requests   Final    NONE Performed at [facility], [address] [place], [place] [address]    Culture   Final    NO GROWTH Performed at [facility], [address], [place], [place] [address]    Report Status 05/27/2024 FINAL  Final    Radiologic Imaging: CT Renal Stone  Study Result Date: 05/31/2024 CLINICAL DATA:  Hematuria EXAM: CT ABDOMEN AND PELVIS WITHOUT CONTRAST TECHNIQUE: Multidetector CT imaging of the abdomen and pelvis was performed following the standard protocol without IV contrast. RADIATION DOSE REDUCTION: This exam was performed according to the departmental dose-optimization program which includes automated exposure control, adjustment of the mA and/or kV according to patient size and/or use of iterative reconstruction technique. COMPARISON:  CT 12/17/2023, 12/07/2019. FINDINGS: Lower chest: Lung bases demonstrate emphysema. Mild scarring left base. No acute airspace disease. Cardiomegaly. Differential cardiac blood pool suggesting anemia Hepatobiliary: Gallstones. No biliary dilatation. Stable 1.6 cm hypodensity in the left hepatic lobe. Pancreas: Unremarkable. No pancreatic ductal dilatation or surrounding inflammatory changes. Spleen: Normal in size without focal abnormality. Adrenals/Urinary Tract: Adrenal glands are normal. 3 mm nonobstructing stone in the lower pole left kidney. Mild left hydronephrosis. Moderate to severe right hydronephrosis with hydroureter, down to the level of the bladder. Catheter in the bladder with displacement of the balloon to the left. Partially decompressed bladder, filled with hyperdense material. There is reflux of hyper dense material into the dilated distal right ureter. Stomach/Bowel: Stomach  nonenlarged. No dilated small bowel. No acute bowel wall thickening. Moderate stool burden Vascular/Lymphatic: Advanced aortic atherosclerosis. No aneurysm. No suspicious lymph nodes Reproductive: Negative prostate Other: Negative for free air.  No pelvic effusion. Musculoskeletal: Chronic compression fractures at L2, L3 and L4. Chronic compression fracture at T12. IMPRESSION: 1. Mild left-sided hydronephrosis and moderate to severe right hydronephrosis and hydroureter, down to the level of the bladder. Catheter in the bladder with partially decompressed bladder; the bladder is filled with hyperdense material, likely blood products/hematoma. There is reflux of hyperdense material into the dilated distal right ureter. Underlying mass not excluded. 2. Gallstones. 3. Cardiomegaly. 4. Aortic atherosclerosis. Aortic Atherosclerosis (ICD10-I70.0). Electronically Signed   By: Luke Bun M.D.   On: 05/31/2024 01:55   Assessment & Plan:  83 y.o. male with PMH chronic macrocytic anemia due to MDS, left renal stone, and a recent history of intermittent hematuria and hydronephrosis in the setting of an extensive smoking history now readmitted with clot retention and bilateral, R>L, hydronephrosis.  Given the extent of his clot burden on imaging, I do not believe I will be able to manually irrigate this out at the bedside.  Instead, I offered him cystoscopy with clot evacuation, fulguration, and possible biopsy versus tumor resection with Dr. Twylla later today.  He is in agreement with this plan.  We discussed that his smoking is a risk factor for bladder cancer, and given his recent hydronephrosis I do question a possible underlying bladder mass at the trigone.  I stopped his CBI for now given OR plans.  Recommendations: - Keep n.p.o. in advance of procedure - Continue antibiotics and follow cultures - Cystoscopy with clot evacuation, fulguration, and possible biopsy/tumor resection with Dr. Twylla this  afternoon - Continue Foley catheter, okay to keep CBI clamped in advance of procedure  Thank you for involving me in this patient's care, I will continue to follow along.  Stephen Tejera, PA-C 05/31/2024 9:10 AM

## 2024-05-31 NOTE — Progress Notes (Signed)
 Samantha, PA at bedside and gave order to discontinue continuous bladder irrigation.

## 2024-05-31 NOTE — Assessment & Plan Note (Signed)
 Creatinine 2.2, up from 0.68 about 6 months prior Secondary to obstructive uropathy Expecting improvement with clearance of clot in bladder and urologic intervention

## 2024-05-31 NOTE — Progress Notes (Signed)
 Pt tolerating clear liquid diet orders to advance as tolerated. No nausea/vomiting noted. Diet advance to full liquid. Apple sauce provided.

## 2024-05-31 NOTE — Plan of Care (Signed)
  Problem: Education: Goal: Knowledge of General Education information will improve Description: Including pain rating scale, medication(s)/side effects and non-pharmacologic comfort measures Outcome: Progressing   Problem: Clinical Measurements: Goal: Cardiovascular complication will be avoided Outcome: Progressing   Problem: Coping: Goal: Level of anxiety will decrease Outcome: Progressing   Problem: Pain Managment: Goal: General experience of comfort will improve and/or be controlled Outcome: Progressing   Problem: Safety: Goal: Ability to remain free from injury will improve Outcome: Progressing   Problem: Skin Integrity: Goal: Risk for impaired skin integrity will decrease Outcome: Progressing

## 2024-05-31 NOTE — ED Notes (Signed)
Bladder scan 0mL.

## 2024-05-31 NOTE — Assessment & Plan Note (Signed)
Nicotine patch if agreeable °

## 2024-05-31 NOTE — H&P (View-Only) (Signed)
 Urology Consult  I have been asked to see the patient by Dr. Lanetta, for evaluation and management of gross hematuria and hydronephrosis.  Chief Complaint: Gross hematuria  History of Present Illness: Stephen Skora. is a 83 y.o. year old male with PMH chronic macrocytic anemia due to MDS, left renal stone, and a recent history of intermittent hematuria, hydronephrosis, and right flank pain since February admitted overnight with recurrent hematuria and difficulty voiding.  CT stone study on presentation notable for mild left hydronephrosis and moderate to severe right hydroureteronephrosis to the level of a partially decompressed bladder with Foley catheter in place.  There are blood products within the bladder refluxing up the right ureter.  Prostate is enlarged.  Admission labs notable for UA with 50 RBC/hpf, 50 WBC/hpf, and no bacteria; urine culture pending, on antibiotics as below; creatinine 2.20 (baseline unclear, possibly 1.3-1.5); white count 17.9; and hemoglobin 8.6.  He reports an extensive, approximate 100-pack-year smoking history, though he admits that he does not inhale anymore like he used to.  He has been referred to our practice several times for his recurrent gross hematuria since February, though he has canceled these visits.  Foley catheter in place draining clear, red urine on slow drip CBI.  He has been n.p.o. since at least 2 AM.  Anti-infectives (From admission, onward)    Start     Dose/Rate Route Frequency Ordered Stop   05/31/24 0730  cefTRIAXone  (ROCEPHIN ) 1 g in sodium chloride  0.9 % 100 mL IVPB  Status:  Discontinued        1 g 200 mL/hr over 30 Minutes Intravenous Every 24 hours 05/31/24 0644 05/31/24 0644   05/31/24 0045  cefTRIAXone  (ROCEPHIN ) 1 g in sodium chloride  0.9 % 100 mL IVPB        1 g 200 mL/hr over 30 Minutes Intravenous  Once 05/31/24 0043 05/31/24 9787       Past Medical History:  Diagnosis Date   Actinic keratosis 03/27/2023    left pretibial, LN2 04/15/23   Basal cell carcinoma 06/11/2018   L zygoma - excision 10/13/2018   BCC (basal cell carcinoma of skin) 06/11/2018   L cheek 5.0 cm ant to the earlobe   Compression fracture of fourth lumbar vertebra (HCC)    SCC (squamous cell carcinoma) 09/18/2023   left lateral leg ED&C done 09/18/23   SCC (squamous cell carcinoma) 04/28/2024   left elbow - treated with ED&C   Squamous cell carcinoma of skin 04/02/2018   R distal lat calf - ED&C   Squamous cell carcinoma of skin 01/31/2022   L med bicep - ED&C   Squamous cell carcinoma of skin 06/12/2022   R temple - ED&C   Squamous cell carcinoma of skin 07/03/2023   left medial calf, EDC   Squamous cell carcinoma of skin 07/03/2023   right medial calf, EDC    Past Surgical History:  Procedure Laterality Date   SKIN CANCER EXCISION     several    Home Medications:  Current Meds  Medication Sig   cephALEXin  (KEFLEX ) 500 MG capsule Take 1 capsule (500 mg total) by mouth 2 (two) times daily for 7 days.   furosemide  (LASIX ) 20 MG tablet Take 1-2 tablets (20-40 mg total) by mouth daily as needed. For increased morning leg swelling    Allergies: No Known Allergies  Family History  Problem Relation Age of Onset   Cancer Mother    Heart disease Neg Hx  Diabetes Neg Hx     Social History:  reports that he has been smoking cigarettes. He has a 31.5 pack-year smoking history. He has been exposed to tobacco smoke. He has never used smokeless tobacco. He reports that he does not currently use alcohol. He reports that he does not currently use drugs.  ROS: A complete review of systems was performed.  All systems are negative except for pertinent findings as noted.  Physical Exam:  Vital signs in last 24 hours: Temp:  [97.8 F (36.6 C)-98.4 F (36.9 C)] 97.8 F (36.6 C) (08/04 0823) Pulse Rate:  [57-94] 64 (08/04 0823) Resp:  [16-18] 17 (08/04 0823) BP: (102-127)/(58-64) 103/64 (08/04 0823) SpO2:  [94  %-100 %] 98 % (08/04 0823) Weight:  [51.6 kg] 51.6 kg (08/04 0301) Constitutional:  Alert, frail appearing, no acute distress HEENT: [place] AT, moist mucus membranes Cardiovascular: No clubbing, cyanosis, or edema Respiratory: Normal respiratory effort Skin: No rashes, bruises or suspicious lesions Neurologic: Grossly intact, no focal deficits, moving all 4 extremities Psychiatric: Normal mood and affect  Laboratory Data:  Recent Labs    05/29/24 2113 05/30/24 2343 05/31/24 0538  WBC 11.4* 17.9*  --   HGB 9.5* 8.6* 6.8*  HCT 29.4* 26.9*  --    Recent Labs    05/29/24 2113 05/30/24 2343  NA 138 139  K 4.2 4.5  CL 101 100  CO2 24 28  GLUCOSE 111* 141*  BUN 33* 43*  CREATININE 1.56* 2.20*  CALCIUM 9.1 9.5   Urinalysis    Component Value Date/Time   COLORURINE RED (A) 05/30/2024 2333   APPEARANCEUR TURBID (A) 05/30/2024 2333   LABSPEC  05/30/2024 2333    TEST NOT REPORTED DUE TO COLOR INTERFERENCE OF URINE PIGMENT   PHURINE  05/30/2024 2333    TEST NOT REPORTED DUE TO COLOR INTERFERENCE OF URINE PIGMENT   GLUCOSEU (A) 05/30/2024 2333    TEST NOT REPORTED DUE TO COLOR INTERFERENCE OF URINE PIGMENT   HGBUR (A) 05/30/2024 2333    TEST NOT REPORTED DUE TO COLOR INTERFERENCE OF URINE PIGMENT   BILIRUBINUR (A) 05/30/2024 2333    TEST NOT REPORTED DUE TO COLOR INTERFERENCE OF URINE PIGMENT   BILIRUBINUR negative 11/27/2023 1448   KETONESUR (A) 05/30/2024 2333    TEST NOT REPORTED DUE TO COLOR INTERFERENCE OF URINE PIGMENT   PROTEINUR (A) 05/30/2024 2333    TEST NOT REPORTED DUE TO COLOR INTERFERENCE OF URINE PIGMENT   UROBILINOGEN 0.2 11/27/2023 1448   NITRITE (A) 05/30/2024 2333    TEST NOT REPORTED DUE TO COLOR INTERFERENCE OF URINE PIGMENT   LEUKOCYTESUR (A) 05/30/2024 2333    TEST NOT REPORTED DUE TO COLOR INTERFERENCE OF URINE PIGMENT   Results for orders placed or performed during the hospital encounter of 05/25/24  Urine Culture     Status: None   Collection Time:  05/25/24  7:12 PM   Specimen: Urine, Clean Catch  Result Value Ref Range Status   Specimen Description   Final    URINE, CLEAN CATCH Performed at [facility], [address] [place], [place] [address]    Special Requests   Final    NONE Performed at [facility], [address] [place], [place] [address]    Culture   Final    NO GROWTH Performed at [facility], [address], [place], [place] [address]    Report Status 05/27/2024 FINAL  Final    Radiologic Imaging: CT Renal Stone  Study Result Date: 05/31/2024 CLINICAL DATA:  Hematuria EXAM: CT ABDOMEN AND PELVIS WITHOUT CONTRAST TECHNIQUE: Multidetector CT imaging of the abdomen and pelvis was performed following the standard protocol without IV contrast. RADIATION DOSE REDUCTION: This exam was performed according to the departmental dose-optimization program which includes automated exposure control, adjustment of the mA and/or kV according to patient size and/or use of iterative reconstruction technique. COMPARISON:  CT 12/17/2023, 12/07/2019. FINDINGS: Lower chest: Lung bases demonstrate emphysema. Mild scarring left base. No acute airspace disease. Cardiomegaly. Differential cardiac blood pool suggesting anemia Hepatobiliary: Gallstones. No biliary dilatation. Stable 1.6 cm hypodensity in the left hepatic lobe. Pancreas: Unremarkable. No pancreatic ductal dilatation or surrounding inflammatory changes. Spleen: Normal in size without focal abnormality. Adrenals/Urinary Tract: Adrenal glands are normal. 3 mm nonobstructing stone in the lower pole left kidney. Mild left hydronephrosis. Moderate to severe right hydronephrosis with hydroureter, down to the level of the bladder. Catheter in the bladder with displacement of the balloon to the left. Partially decompressed bladder, filled with hyperdense material. There is reflux of hyper dense material into the dilated distal right ureter. Stomach/Bowel: Stomach  nonenlarged. No dilated small bowel. No acute bowel wall thickening. Moderate stool burden Vascular/Lymphatic: Advanced aortic atherosclerosis. No aneurysm. No suspicious lymph nodes Reproductive: Negative prostate Other: Negative for free air.  No pelvic effusion. Musculoskeletal: Chronic compression fractures at L2, L3 and L4. Chronic compression fracture at T12. IMPRESSION: 1. Mild left-sided hydronephrosis and moderate to severe right hydronephrosis and hydroureter, down to the level of the bladder. Catheter in the bladder with partially decompressed bladder; the bladder is filled with hyperdense material, likely blood products/hematoma. There is reflux of hyperdense material into the dilated distal right ureter. Underlying mass not excluded. 2. Gallstones. 3. Cardiomegaly. 4. Aortic atherosclerosis. Aortic Atherosclerosis (ICD10-I70.0). Electronically Signed   By: Luke Bun M.D.   On: 05/31/2024 01:55   Assessment & Plan:  83 y.o. male with PMH chronic macrocytic anemia due to MDS, left renal stone, and a recent history of intermittent hematuria and hydronephrosis in the setting of an extensive smoking history now readmitted with clot retention and bilateral, R>L, hydronephrosis.  Given the extent of his clot burden on imaging, I do not believe I will be able to manually irrigate this out at the bedside.  Instead, I offered him cystoscopy with clot evacuation, fulguration, and possible biopsy versus tumor resection with Dr. Twylla later today.  He is in agreement with this plan.  We discussed that his smoking is a risk factor for bladder cancer, and given his recent hydronephrosis I do question a possible underlying bladder mass at the trigone.  I stopped his CBI for now given OR plans.  Recommendations: - Keep n.p.o. in advance of procedure - Continue antibiotics and follow cultures - Cystoscopy with clot evacuation, fulguration, and possible biopsy/tumor resection with Dr. Twylla this  afternoon - Continue Foley catheter, okay to keep CBI clamped in advance of procedure  Thank you for involving me in this patient's care, I will continue to follow along.  Stephen Fleener, PA-C 05/31/2024 9:10 AM

## 2024-05-31 NOTE — Interval H&P Note (Signed)
 History and Physical Interval Note:  05/31/2024 4:41 PM  Stephen FORBES Dietrich Bennett.  has presented today for surgery, with the diagnosis of Gross hematuria with clot retention.  The various methods of treatment have been discussed with the patient and family. After consideration of risks, benefits and other options for treatment, the patient has consented to  Procedure(s) with comments: CYSTOSCOPY, WITH BLADDER FULGURATION (N/A) - possible bladder biopsy and possible tumor resection as a surgical intervention.  The patient's history has been reviewed, patient examined, no change in status, stable for surgery.  I have reviewed the patient's chart and labs.  Questions were answered to the patient's satisfaction.   I also discussed possible need for placement of a right ureteral stent and right ureteroscopy.  CV: RRR Lungs: Clear  Heliodoro Domagalski C Jaclin Finks

## 2024-05-31 NOTE — Progress Notes (Signed)
 Dr. Lanetta gave order to recheck hemoglobin 5 hours after PRBC transfusion ended.

## 2024-05-31 NOTE — Assessment & Plan Note (Signed)
 On as needed opiates hydrocodone /tramadol 

## 2024-05-31 NOTE — Assessment & Plan Note (Addendum)
 Acute blood loss anemia Bilateral hydronephrosis History of right nephrolithiasis with hydronephrosis 11/2023 Continue CBI Serial H&H and transfuse if needed Pain control Follow-up urine culture to evaluate for UTI Urology consult: Per Dr Watt (addendum) He probably needs hand irrigation over CBI.  Unless he has already been irrigated free of clot.  There is too much clot in the bladder for the CBI to be of any help and it is more likely to cause problems.  --Manual irrigation ordered Keep n.p.o. in case of procedure - Dr Watt advised will be signing out to Dr Twylla SCD for DVT prophylaxis

## 2024-05-31 NOTE — Progress Notes (Signed)
 RN went in room and patient smoking cigarette. Patient educated that he is not allowed to smoke inside nor on hospital property. Patient verbalized understanding and put cigarette out. RN took cigarette pack and lighter away from patient.

## 2024-05-31 NOTE — Progress Notes (Signed)
 Nonbillable note Patient admitted for acute blood loss anemia secondary to gross hematuria requiring transfusion of 1 unit of packed RBC.  Urology consult pending for bilateral hydronephrosis noted on CT scan of abdomen and pelvis without contrast.  Patient noted to have an AKI most likely postobstructive. Seen during rounds and had been smoking in his room.  Nicotine  patch ordered and placed.  Patient counseled on the need to abstain from further nicotine  use while admitted. Encouraged to remain hospitalized so that he can see urology for possible stent placement.

## 2024-05-31 NOTE — ED Provider Notes (Signed)
 [facility] Provider Note    Event Date/Time   First MD Initiated Contact with Patient 05/30/24 2357     (approximate)   History   Chief Complaint Hematuria   HPI  Stephen Bennett. is a 83 y.o. male with past medical history of mild dysplastic syndrome, COPD, and chronic pain syndrome who presents to the ED complaining of hematuria.  Patient has been dealing with persistent hematuria for about the past week and this is his third ED visit for the symptoms.  When he was seen in the ED last night, patient had three-way Foley catheter placed with CBI, but he declined admission and catheter was removed.  Since then, he states that the hematuria is worsening and he has been passing numerous small clots.  He feels like he cannot completely empty his bladder and continues to have dysuria.  He denies any fevers or flank pain, did take Keflex  that was prescribed during visit yesterday.  He does not take a blood thinner.     Physical Exam   Triage Vital Signs: ED Triage Vitals  Encounter Vitals Group     BP 05/30/24 2334 (!) 102/59     Girls Systolic BP Percentile --      Girls Diastolic BP Percentile --      Boys Systolic BP Percentile --      Boys Diastolic BP Percentile --      Pulse Rate 05/30/24 2334 94     Resp 05/30/24 2334 16     Temp 05/30/24 2334 98.2 F (36.8 C)     Temp src --      SpO2 05/30/24 2334 94 %     Weight --      Height 05/30/24 2332 5' 6 (1.676 m)     Head Circumference --      Peak Flow --      Pain Score 05/30/24 2332 0     Pain Loc --      Pain Education --      Exclude from Growth Chart --     Most recent vital signs: Vitals:   05/30/24 2334  BP: (!) 102/59  Pulse: 94  Resp: 16  Temp: 98.2 F (36.8 C)  SpO2: 94%    Constitutional: Alert and oriented. Eyes: Conjunctivae are normal. Head: Atraumatic. Nose: No congestion/rhinnorhea. Mouth/Throat: Mucous membranes are moist.  Cardiovascular: Normal rate, regular  rhythm. Grossly normal heart sounds.  2+ radial pulses bilaterally. Respiratory: Normal respiratory effort.  No retractions. Lungs CTAB. Gastrointestinal: Soft and nontender. No distention. Musculoskeletal: No lower extremity tenderness nor edema.  Neurologic:  Normal speech and language. No gross focal neurologic deficits are appreciated.    ED Results / Procedures / Treatments   Labs (all labs ordered are listed, but only abnormal results are displayed) Labs Reviewed  URINALYSIS, ROUTINE W REFLEX MICROSCOPIC - Abnormal; Notable for the following components:      Result Value   Color, Urine RED (*)    APPearance TURBID (*)    Glucose, UA   (*)    Value: TEST NOT REPORTED DUE TO COLOR INTERFERENCE OF URINE PIGMENT   Hgb urine dipstick   (*)    Value: TEST NOT REPORTED DUE TO COLOR INTERFERENCE OF URINE PIGMENT   Bilirubin Urine   (*)    Value: TEST NOT REPORTED DUE TO COLOR INTERFERENCE OF URINE PIGMENT   Ketones, ur   (*)    Value: TEST NOT REPORTED DUE TO COLOR  INTERFERENCE OF URINE PIGMENT   Protein, ur   (*)    Value: TEST NOT REPORTED DUE TO COLOR INTERFERENCE OF URINE PIGMENT   Nitrite   (*)    Value: TEST NOT REPORTED DUE TO COLOR INTERFERENCE OF URINE PIGMENT   Leukocytes,Ua   (*)    Value: TEST NOT REPORTED DUE TO COLOR INTERFERENCE OF URINE PIGMENT   All other components within normal limits  BASIC METABOLIC PANEL WITH GFR - Abnormal; Notable for the following components:   Glucose, Bld 141 (*)    BUN 43 (*)    Creatinine, Ser 2.20 (*)    GFR, Estimated 29 (*)    All other components within normal limits  CBC - Abnormal; Notable for the following components:   WBC 17.9 (*)    RBC 2.64 (*)    Hemoglobin 8.6 (*)    HCT 26.9 (*)    MCV 101.9 (*)    RDW 25.1 (*)    nRBC 0.4 (*)    All other components within normal limits  URINE CULTURE    PROCEDURES:  Critical Care performed: No  Procedures   MEDICATIONS ORDERED IN ED: Medications  sodium chloride   irrigation 0.9 % 3,000 mL (has no administration in time range)  cefTRIAXone  (ROCEPHIN ) 1 g in sodium chloride  0.9 % 100 mL IVPB (has no administration in time range)  morphine  (PF) 2 MG/ML injection 2 mg (2 mg Intravenous Given 05/31/24 0047)  lidocaine  (XYLOCAINE ) 2 % jelly 1 Application (1 Application Urethral Given 05/31/24 0049)  lactated ringers  bolus 1,000 mL (1,000 mLs Intravenous New Bag/Given 05/31/24 0046)     IMPRESSION / MDM / ASSESSMENT AND PLAN / ED COURSE  I reviewed the triage vital signs and the nursing notes.                              83 y.o. male with past medical history of myelodysplastic syndrome, COPD, and chronic pain syndrome who presents to the ED complaining of ongoing hematuria with difficulty emptying his bladder and dysuria.  Patient's presentation is most consistent with acute presentation with potential threat to life or bodily function.  Differential diagnosis includes, but is not limited to, hematuria, anemia, electrolyte abnormality, AKI, urinary retention, cystitis, pyelonephritis.  Patient nontoxic-appearing and in no acute distress, vital signs remarkable for borderline hypotension but otherwise reassuring.  He feels like he cannot quite empty his bladder, but no signs of severe urinary retention at this time, although he did have elevated postvoid residual measurements during ED visit yesterday.  We will replace three-way Foley catheter and initiate CBI, labs show drop of hemoglobin by 1 point since his visit yesterday.  Patient also with AKI, likely due to retention but will give IV fluid bolus.  Patient also noted to have leukocytosis and with his dysuria we will treat with IV Rocephin .  Urine was again sent for culture, culture from 24 hours ago has not yet resulted.  Case discussed with hospitalist for admission.      FINAL CLINICAL IMPRESSION(S) / ED DIAGNOSES   Final diagnoses:  Gross hematuria  Lower urinary tract infectious disease  Urinary  retention     Rx / DC Orders   ED Discharge Orders     None        Note:  This document was prepared using Dragon voice recognition software and may include unintentional dictation errors.   Willo Dunnings, MD 05/31/24 [phone]

## 2024-06-01 ENCOUNTER — Encounter: Payer: Self-pay | Admitting: Urology

## 2024-06-01 DIAGNOSIS — N200 Calculus of kidney: Secondary | ICD-10-CM | POA: Diagnosis present

## 2024-06-01 DIAGNOSIS — J449 Chronic obstructive pulmonary disease, unspecified: Secondary | ICD-10-CM | POA: Diagnosis present

## 2024-06-01 DIAGNOSIS — D539 Nutritional anemia, unspecified: Secondary | ICD-10-CM | POA: Diagnosis present

## 2024-06-01 DIAGNOSIS — Z85828 Personal history of other malignant neoplasm of skin: Secondary | ICD-10-CM | POA: Diagnosis not present

## 2024-06-01 DIAGNOSIS — D4959 Neoplasm of unspecified behavior of other genitourinary organ: Secondary | ICD-10-CM | POA: Diagnosis not present

## 2024-06-01 DIAGNOSIS — R310 Gross hematuria: Secondary | ICD-10-CM | POA: Diagnosis present

## 2024-06-01 DIAGNOSIS — Z79899 Other long term (current) drug therapy: Secondary | ICD-10-CM | POA: Diagnosis not present

## 2024-06-01 DIAGNOSIS — N390 Urinary tract infection, site not specified: Secondary | ICD-10-CM | POA: Diagnosis present

## 2024-06-01 DIAGNOSIS — N400 Enlarged prostate without lower urinary tract symptoms: Secondary | ICD-10-CM | POA: Diagnosis present

## 2024-06-01 DIAGNOSIS — Z87442 Personal history of urinary calculi: Secondary | ICD-10-CM | POA: Diagnosis not present

## 2024-06-01 DIAGNOSIS — N136 Pyonephrosis: Secondary | ICD-10-CM | POA: Diagnosis present

## 2024-06-01 DIAGNOSIS — N179 Acute kidney failure, unspecified: Secondary | ICD-10-CM | POA: Diagnosis present

## 2024-06-01 DIAGNOSIS — M545 Low back pain, unspecified: Secondary | ICD-10-CM | POA: Diagnosis present

## 2024-06-01 DIAGNOSIS — D62 Acute posthemorrhagic anemia: Secondary | ICD-10-CM | POA: Diagnosis present

## 2024-06-01 DIAGNOSIS — G894 Chronic pain syndrome: Secondary | ICD-10-CM | POA: Diagnosis present

## 2024-06-01 DIAGNOSIS — Z809 Family history of malignant neoplasm, unspecified: Secondary | ICD-10-CM | POA: Diagnosis not present

## 2024-06-01 DIAGNOSIS — E11621 Type 2 diabetes mellitus with foot ulcer: Secondary | ICD-10-CM | POA: Diagnosis present

## 2024-06-01 DIAGNOSIS — F17210 Nicotine dependence, cigarettes, uncomplicated: Secondary | ICD-10-CM | POA: Diagnosis present

## 2024-06-01 DIAGNOSIS — D469 Myelodysplastic syndrome, unspecified: Secondary | ICD-10-CM | POA: Diagnosis present

## 2024-06-01 LAB — URINE CULTURE: Culture: NO GROWTH

## 2024-06-01 LAB — BASIC METABOLIC PANEL WITH GFR
Anion gap: 4 — ABNORMAL LOW (ref 5–15)
BUN: 36 mg/dL — ABNORMAL HIGH (ref 8–23)
CO2: 27 mmol/L (ref 22–32)
Calcium: 8.3 mg/dL — ABNORMAL LOW (ref 8.9–10.3)
Chloride: 108 mmol/L (ref 98–111)
Creatinine, Ser: 1.41 mg/dL — ABNORMAL HIGH (ref 0.61–1.24)
GFR, Estimated: 49 mL/min — ABNORMAL LOW (ref >60)
Glucose, Bld: 104 mg/dL — ABNORMAL HIGH (ref 70–99)
Potassium: 3.9 mmol/L (ref 3.5–5.1)
Sodium: 139 mmol/L (ref 135–145)

## 2024-06-01 LAB — TYPE AND SCREEN
ABO/RH(D): O POS
Antibody Screen: NEGATIVE
Unit division: 0

## 2024-06-01 LAB — BPAM RBC
Blood Product Expiration Date: 202508302359
ISSUE DATE / TIME: 202508041137
Unit Type and Rh: 5100

## 2024-06-01 LAB — CBC
HCT: 25.2 % — ABNORMAL LOW (ref 39.0–52.0)
Hemoglobin: 8.2 g/dL — ABNORMAL LOW (ref 13.0–17.0)
MCH: 32.4 pg (ref 26.0–34.0)
MCHC: 32.5 g/dL (ref 30.0–36.0)
MCV: 99.6 fL (ref 80.0–100.0)
Platelets: 189 K/uL (ref 150–400)
RBC: 2.53 MIL/uL — ABNORMAL LOW (ref 4.22–5.81)
RDW: 25 % — ABNORMAL HIGH (ref 11.5–15.5)
WBC: 13.2 K/uL — ABNORMAL HIGH (ref 4.0–10.5)
nRBC: 0.2 % (ref 0.0–0.2)

## 2024-06-01 MED ORDER — CHLORHEXIDINE GLUCONATE CLOTH 2 % EX PADS
6.0000 | MEDICATED_PAD | Freq: Every day | CUTANEOUS | Status: DC
  Administered 2024-06-01: 6 via TOPICAL

## 2024-06-01 NOTE — Plan of Care (Signed)

## 2024-06-01 NOTE — Progress Notes (Signed)
 PROGRESS NOTE  Stephen Bennett Dietrich Mickey.    DOB: January 05, 1941, 83 y.o.  FMW:[id number]    Code Status: Full Code   DOA: 05/30/2024   LOS: 0   Brief hospital course  Stephen Bennett. is a 84 y.o. male with medical history significant for Chronic macrocytic anemia attributed to MDS, chronic back pain, tobacco use disorder, right renal calculus with hydronephrosis symptomatic for intermittent hematuria and right flank pain since 11/2023. He is presenting with hematuria.  ED course: BP 102/59 with otherwise normal vitals. WBC 18,000 and hgb 8.6, down from baseline of 9.5 on his earlier visit on 8/2.  Creatinine 2.2 above baseline around 1.28 in the past 6 months.  Urinalysis showing red turbid urine without bacteria or WBCs. CT renal stone study: Mild left-sided hydronephrosis and moderate to severe right hydronephrosis and hydroureter, down to the level of the bladder. Catheter in the bladder with partially decompressed bladder; the bladder is filled with hyperdense material, likely blood products/hematoma. There is reflux of hyperdense material into the dilated distal right ureter. Underlying mass not excluded   Patient started on CBI, given a LR bolus and morphine . Urology was consulted- underwent cystoscopy with ureteral fulguration, R ureter stent placement, and biopsy of ureteral tumor with ablation for hemostasis.   06/01/24 -denies concerns. Wants to go home.   Assessment & Plan  Principal Problem:   Gross hematuria Active Problems:   Right nephrolithiasis   AKI (acute kidney injury) (HCC)   Chronic low back pain   Myelodysplasia (myelodysplastic syndrome) (HCC)   ABLA (acute blood loss anemia)   Tobacco use disorder  Gross hematuria  Acute blood loss anemia Bilateral hydronephrosis History of right nephrolithiasis with hydronephrosis 11/2023 R ureteral tumor- s/p cystoscopy with ureteral fulguration, R ureter stent placement, and biopsy of ureteral tumor with ablation for  hemostasis 8/4. - urology following, appreciate your care - Serial H&H and transfuse if needed - Pain control - continue foley and manual irrigation. Continues to have dark red urine in collection system this am. - f/u bx results   AKI Secondary to obstructive uropathy- greatly improved after stent placement. Cr 2.2>>1.41 rtoday Creatinine 2.2, up from 0.68 about 6 months prior   Tobacco use disorder Nicotine  patch if agreeable   Myelodysplasia (myelodysplastic syndrome) (HCC) Chronic microcytic anemia Has followed with oncology-last visit 2023   Chronic low back pain On as needed opiates hydrocodone /tramadol   Body mass index is 18.36 kg/m.  VTE ppx: SCDs Start: 05/31/24 9786  Diet:     Diet   Diet regular Room service appropriate? Yes; Fluid consistency: Thin   Consultants: Urology   Subjective 06/01/24    Pt reports feeling fine. He wants to go home since he has stuff to do. Discussed his indication for staying in the hospital and my recommendations and he would be high risk of return given still significant blood grossly present in urine.    Objective  Blood pressure 101/60, pulse 66, temperature 99.1 F (37.3 C), resp. rate 18, height 5' 6 (1.676 m), weight 51.6 kg, SpO2 95%.  Intake/Output Summary (Last 24 hours) at 06/01/2024 0745 Last data filed at 06/01/2024 0600 Gross per 24 hour  Intake 4373 ml  Output 6400 ml  Net -2027 ml   Filed Weights   05/31/24 0301  Weight: 51.6 kg    Physical Exam:  General: awake, alert, NAD HEENT: atraumatic, clear conjunctiva, anicteric sclera, MMM, hearing grossly normal Respiratory: normal respiratory effort. Cardiovascular: quick capillary refill, normal S1/S2, RRR,  no JVD, murmurs Gastrointestinal: soft, NT, ND Nervous: A&O x2. normal speech Extremities: moves all equally, no edema, normal tone Skin: dry, intact, significant ecchymosis  Psychiatry: anxious, mildly agitated   Labs   I have personally reviewed the  following labs and imaging studies CBC    Component Value Date/Time   WBC 13.2 (H) 06/01/2024 0301   RBC 2.53 (L) 06/01/2024 0301   HGB 8.2 (L) 06/01/2024 0301   HCT 25.2 (L) 06/01/2024 0301   PLT 189 06/01/2024 0301   MCV 99.6 06/01/2024 0301   MCH 32.4 06/01/2024 0301   MCHC 32.5 06/01/2024 0301   RDW 25.0 (H) 06/01/2024 0301   LYMPHSABS 2.9 05/29/2024 2113   MONOABS 1.1 (H) 05/29/2024 2113   EOSABS 0.0 05/29/2024 2113   BASOSABS 0.1 05/29/2024 2113      Latest Ref Rng & Units 06/01/2024    3:01 AM 05/30/2024   11:43 PM 05/29/2024    9:13 PM  BMP  Glucose 70 - 99 mg/dL 895  858  888   BUN 8 - 23 mg/dL 36  43  33   Creatinine 0.61 - 1.24 mg/dL 8.58  7.79  8.43   Sodium 135 - 145 mmol/L 139  139  138   Potassium 3.5 - 5.1 mmol/L 3.9  4.5  4.2   Chloride 98 - 111 mmol/L 108  100  101   CO2 22 - 32 mmol/L 27  28  24    Calcium 8.9 - 10.3 mg/dL 8.3  9.5  9.1     DG OR UROLOGY CYSTO IMAGE ([facility] ONLY) Result Date: 05/31/2024 There is no interpretation for this exam.  This order is for images obtained during a surgical procedure.  Please See Surgeries Tab for more information regarding the procedure.   CT Renal Stone Study Result Date: 05/31/2024 CLINICAL DATA:  Hematuria EXAM: CT ABDOMEN AND PELVIS WITHOUT CONTRAST TECHNIQUE: Multidetector CT imaging of the abdomen and pelvis was performed following the standard protocol without IV contrast. RADIATION DOSE REDUCTION: This exam was performed according to the departmental dose-optimization program which includes automated exposure control, adjustment of the mA and/or kV according to patient size and/or use of iterative reconstruction technique. COMPARISON:  CT 12/17/2023, 12/07/2019. FINDINGS: Lower chest: Lung bases demonstrate emphysema. Mild scarring left base. No acute airspace disease. Cardiomegaly. Differential cardiac blood pool suggesting anemia Hepatobiliary: Gallstones. No biliary dilatation. Stable 1.6 cm hypodensity in the left  hepatic lobe. Pancreas: Unremarkable. No pancreatic ductal dilatation or surrounding inflammatory changes. Spleen: Normal in size without focal abnormality. Adrenals/Urinary Tract: Adrenal glands are normal. 3 mm nonobstructing stone in the lower pole left kidney. Mild left hydronephrosis. Moderate to severe right hydronephrosis with hydroureter, down to the level of the bladder. Catheter in the bladder with displacement of the balloon to the left. Partially decompressed bladder, filled with hyperdense material. There is reflux of hyper dense material into the dilated distal right ureter. Stomach/Bowel: Stomach nonenlarged. No dilated small bowel. No acute bowel wall thickening. Moderate stool burden Vascular/Lymphatic: Advanced aortic atherosclerosis. No aneurysm. No suspicious lymph nodes Reproductive: Negative prostate Other: Negative for free air.  No pelvic effusion. Musculoskeletal: Chronic compression fractures at L2, L3 and L4. Chronic compression fracture at T12. IMPRESSION: 1. Mild left-sided hydronephrosis and moderate to severe right hydronephrosis and hydroureter, down to the level of the bladder. Catheter in the bladder with partially decompressed bladder; the bladder is filled with hyperdense material, likely blood products/hematoma. There is reflux of hyperdense material into the dilated distal right  ureter. Underlying mass not excluded. 2. Gallstones. 3. Cardiomegaly. 4. Aortic atherosclerosis. Aortic Atherosclerosis (ICD10-I70.0). Electronically Signed   By: Luke Bun M.D.   On: 05/31/2024 01:55    Disposition Plan & Communication  Patient status: Observation  Admitted From: Home Planned disposition location: Home Anticipated discharge date: TBD pending bleeding stabilized  Family Communication: none    Author: Marien LITTIE Piety, DO [facility] 06/01/2024, 7:45 AM   Available by Epic secure chat 7AM-7PM. If 7PM-7AM, please contact night-coverage.  [facility] contact information  found on [email or web address].

## 2024-06-01 NOTE — Progress Notes (Signed)
 Urology Inpatient Progress Note  Subjective: No acute events overnight. He is afebrile, VSS. Hemoglobin stable, 8.2.  White count down, 13.2.  Creatinine down, 1.41.  Urine culture negative, on antibiotics as below. Foley catheter in place draining clear, light red urine without obstructing clots off CBI. He denies pain today.  No acute concerns.  Anti-infectives: Anti-infectives (From admission, onward)    Start     Dose/Rate Route Frequency Ordered Stop   06/01/24 0000  cefTRIAXone  (ROCEPHIN ) 1 g in sodium chloride  0.9 % 100 mL IVPB        1 g 200 mL/hr over 30 Minutes Intravenous Every 24 hours 05/31/24 1359     05/31/24 0730  cefTRIAXone  (ROCEPHIN ) 1 g in sodium chloride  0.9 % 100 mL IVPB  Status:  Discontinued        1 g 200 mL/hr over 30 Minutes Intravenous Every 24 hours 05/31/24 0644 05/31/24 0644   05/31/24 0045  cefTRIAXone  (ROCEPHIN ) 1 g in sodium chloride  0.9 % 100 mL IVPB        1 g 200 mL/hr over 30 Minutes Intravenous  Once 05/31/24 0043 05/31/24 0212       Current Facility-Administered Medications  Medication Dose Route Frequency Provider Last Rate Last Admin   acetaminophen  (TYLENOL ) tablet 650 mg  650 mg Oral Q6H PRN Stoioff, Scott C, MD       Or   acetaminophen  (TYLENOL ) suppository 650 mg  650 mg Rectal Q6H PRN Stoioff, Scott C, MD       cefTRIAXone  (ROCEPHIN ) 1 g in sodium chloride  0.9 % 100 mL IVPB  1 g Intravenous Q24H Stoioff, Scott C, MD   Stopped at 05/31/24 2338   Chlorhexidine  Gluconate Cloth 2 % PADS 6 each  6 each Topical Daily Lenon Marien CROME, MD       HYDROcodone -acetaminophen  (NORCO/VICODIN) 5-325 MG per tablet 1 tablet  1 tablet Oral Q4H PRN Stoioff, Scott C, MD   1 tablet at 05/31/24 1009   morphine  (PF) 2 MG/ML injection 2 mg  2 mg Intravenous Q2H PRN Stoioff, Scott C, MD   2 mg at 06/01/24 0100   nicotine  (NICODERM CQ  - dosed in mg/24 hours) patch 14 mg  14 mg Transdermal Daily Stoioff, Scott C, MD   14 mg at 06/01/24 9078   ondansetron   (ZOFRAN ) tablet 4 mg  4 mg Oral Q6H PRN Stoioff, Scott C, MD       Or   ondansetron  (ZOFRAN ) injection 4 mg  4 mg Intravenous Q6H PRN Stoioff, Scott C, MD       Objective: Vital signs in last 24 hours: Temp:  [97.1 F (36.2 C)-99.1 F (37.3 C)] 98.1 F (36.7 C) (08/05 0743) Pulse Rate:  [30-70] 66 (08/05 0743) Resp:  [6-26] 17 (08/05 0743) BP: (101-126)/(47-68) 109/62 (08/05 0743) SpO2:  [92 %-100 %] 98 % (08/05 0743)  Intake/Output from previous day: 08/04 0701 - 08/05 0700 In: 4373 [P.O.:240; I.V.:750; Blood:283; IV Piggyback:100] Out: 6400 [Urine:6400] Intake/Output this shift: No intake/output data recorded.  Physical Exam Vitals and nursing note reviewed.  Constitutional:      General: He is not in acute distress.    Appearance: He is not ill-appearing, toxic-appearing or diaphoretic.  HENT:     Head: Normocephalic and atraumatic.  Pulmonary:     Effort: Pulmonary effort is normal. No respiratory distress.  Skin:    General: Skin is warm and dry.  Neurological:     Mental Status: He is alert and oriented to person,  place, and time.  Psychiatric:        Mood and Affect: Mood normal.        Behavior: Behavior normal.    Lab Results:  Recent Labs    05/30/24 2343 05/31/24 0538 05/31/24 2123 06/01/24 0301  WBC 17.9*  --   --  13.2*  HGB 8.6*   < > 8.8* 8.2*  HCT 26.9*  --   --  25.2*  PLT 249  --   --  189   < > = values in this interval not displayed.   BMET Recent Labs    05/30/24 2343 06/01/24 0301  NA 139 139  K 4.5 3.9  CL 100 108  CO2 28 27  GLUCOSE 141* 104*  BUN 43* 36*  CREATININE 2.20* 1.41*  CALCIUM 9.5 8.3*   Studies/Results: DG OR UROLOGY CYSTO IMAGE ([facility] ONLY) Result Date: 05/31/2024 There is no interpretation for this exam.  This order is for images obtained during a surgical procedure.  Please See Surgeries Tab for more information regarding the procedure.   CT Renal Stone Study Result Date: 05/31/2024 CLINICAL DATA:  Hematuria  EXAM: CT ABDOMEN AND PELVIS WITHOUT CONTRAST TECHNIQUE: Multidetector CT imaging of the abdomen and pelvis was performed following the standard protocol without IV contrast. RADIATION DOSE REDUCTION: This exam was performed according to the departmental dose-optimization program which includes automated exposure control, adjustment of the mA and/or kV according to patient size and/or use of iterative reconstruction technique. COMPARISON:  CT 12/17/2023, 12/07/2019. FINDINGS: Lower chest: Lung bases demonstrate emphysema. Mild scarring left base. No acute airspace disease. Cardiomegaly. Differential cardiac blood pool suggesting anemia Hepatobiliary: Gallstones. No biliary dilatation. Stable 1.6 cm hypodensity in the left hepatic lobe. Pancreas: Unremarkable. No pancreatic ductal dilatation or surrounding inflammatory changes. Spleen: Normal in size without focal abnormality. Adrenals/Urinary Tract: Adrenal glands are normal. 3 mm nonobstructing stone in the lower pole left kidney. Mild left hydronephrosis. Moderate to severe right hydronephrosis with hydroureter, down to the level of the bladder. Catheter in the bladder with displacement of the balloon to the left. Partially decompressed bladder, filled with hyperdense material. There is reflux of hyper dense material into the dilated distal right ureter. Stomach/Bowel: Stomach nonenlarged. No dilated small bowel. No acute bowel wall thickening. Moderate stool burden Vascular/Lymphatic: Advanced aortic atherosclerosis. No aneurysm. No suspicious lymph nodes Reproductive: Negative prostate Other: Negative for free air.  No pelvic effusion. Musculoskeletal: Chronic compression fractures at L2, L3 and L4. Chronic compression fracture at T12. IMPRESSION: 1. Mild left-sided hydronephrosis and moderate to severe right hydronephrosis and hydroureter, down to the level of the bladder. Catheter in the bladder with partially decompressed bladder; the bladder is filled with  hyperdense material, likely blood products/hematoma. There is reflux of hyperdense material into the dilated distal right ureter. Underlying mass not excluded. 2. Gallstones. 3. Cardiomegaly. 4. Aortic atherosclerosis. Aortic Atherosclerosis (ICD10-I70.0). Electronically Signed   By: Luke Bun M.D.   On: 05/31/2024 01:55   Assessment & Plan: 83 y.o. male with PMH chronic macrocytic anemia due to MDS, left renal stone, and a recent history of intermittent hematuria and hydronephrosis now admitted with clot retention and bilateral hydronephrosis s/p cystoscopy with clot evacuation, right retrograde pyelogram, and biopsy/laser ablation of distal right ureteral tumor.  Blood counts are stable.  He still having gross hematuria, there are no indication for resuming CBI at this time.  Okay for inpatient voiding trial tomorrow morning.  We reviewed intraoperative findings of a distal right ureteral tumor  that is concerning for urologic malignancy.  I am getting him scheduled for outpatient follow-up with Dr. Twylla to discuss pathology findings.  He expressed understanding.  Recommendations: - Okay to discontinue antibiotics given multiple negative cultures - Recommend inpatient voiding trial tomorrow with early morning Foley removal and postvoid bladder scan at least 4 hours after Foley removal, or sooner if unable to void and having pain - Outpatient follow-up with Dr. Twylla, scheduling in progress  Lucie Hones, PA-C 06/01/2024

## 2024-06-01 NOTE — Anesthesia Postprocedure Evaluation (Signed)
 Anesthesia Post Note  Patient: Stephen Bennett.  Procedure(s) Performed: CYSTOSCOPY, WITH URETERAL FULGURATION CYSTOSCOPY, WITH BIOPSY (Right) CYSTOSCOPY, WITH STENT INSERTION (Right: Ureter) CYSTOURETEROSCOPY, USING HOLMIUM LASER (Right: Ureter)  Patient location during evaluation: PACU Anesthesia Type: General Level of consciousness: awake and alert Pain management: pain level controlled Vital Signs Assessment: post-procedure vital signs reviewed and stable Respiratory status: spontaneous breathing, nonlabored ventilation, respiratory function stable and patient connected to nasal cannula oxygen Cardiovascular status: blood pressure returned to baseline and stable Postop Assessment: no apparent nausea or vomiting Anesthetic complications: no   No notable events documented.   Last Vitals:  Vitals:   06/01/24 0100 06/01/24 0225  BP: 111/66 101/60  Pulse: 68 66  Resp:  18  Temp: 36.6 C 37.3 C  SpO2: 94% (!) 87%    Last Pain:  Vitals:   06/01/24 0130  TempSrc:   PainSc: 2                  Lendia LITTIE Mae

## 2024-06-01 NOTE — Care Management Obs Status (Signed)
 MEDICARE OBSERVATION STATUS NOTIFICATION   Patient Details  Name: Stephen Bennett. MRN: [id number] Date of Birth: [birth year]   Medicare Observation Status Notification Given:  Yes    Greenlee Ancheta W, CMA 06/01/2024, 9:58 AM

## 2024-06-02 ENCOUNTER — Other Ambulatory Visit: Payer: Self-pay | Admitting: Physician Assistant

## 2024-06-02 DIAGNOSIS — R310 Gross hematuria: Secondary | ICD-10-CM

## 2024-06-02 LAB — CBC
HCT: 24.2 % — ABNORMAL LOW (ref 39.0–52.0)
Hemoglobin: 7.7 g/dL — ABNORMAL LOW (ref 13.0–17.0)
MCH: 32 pg (ref 26.0–34.0)
MCHC: 31.8 g/dL (ref 30.0–36.0)
MCV: 100.4 fL — ABNORMAL HIGH (ref 80.0–100.0)
Platelets: 163 K/uL (ref 150–400)
RBC: 2.41 MIL/uL — ABNORMAL LOW (ref 4.22–5.81)
RDW: 24.1 % — ABNORMAL HIGH (ref 11.5–15.5)
WBC: 7.1 K/uL (ref 4.0–10.5)
nRBC: 0.4 % — ABNORMAL HIGH (ref 0.0–0.2)

## 2024-06-02 LAB — BASIC METABOLIC PANEL WITH GFR
Anion gap: 6 (ref 5–15)
BUN: 28 mg/dL — ABNORMAL HIGH (ref 8–23)
CO2: 27 mmol/L (ref 22–32)
Calcium: 8.2 mg/dL — ABNORMAL LOW (ref 8.9–10.3)
Chloride: 105 mmol/L (ref 98–111)
Creatinine, Ser: 0.89 mg/dL (ref 0.61–1.24)
GFR, Estimated: >60 mL/min (ref >60)
Glucose, Bld: 95 mg/dL (ref 70–99)
Potassium: 3.8 mmol/L (ref 3.5–5.1)
Sodium: 138 mmol/L (ref 135–145)

## 2024-06-02 LAB — SURGICAL PATHOLOGY

## 2024-06-02 LAB — CYTOLOGY - NON PAP

## 2024-06-02 MED ORDER — HYDROCODONE-ACETAMINOPHEN 5-325 MG PO TABS: 1.0000 | ORAL_TABLET | Freq: Four times a day (QID) | ORAL | 0 refills | Status: AC | PRN

## 2024-06-02 NOTE — Plan of Care (Signed)

## 2024-06-02 NOTE — Plan of Care (Signed)

## 2024-06-02 NOTE — Discharge Summary (Addendum)
 Physician Discharge Summary  Stephen Bennett. FMW:[id number] DOB: 04-08-1941 DOA: 05/30/2024  PCP: [name], MD  Admit date: 05/30/2024 Discharge date: 06/02/2024  Admitted From: home Disposition: home  Recommendations for Outpatient Follow-up:  Follow up with PCP in 1-2 weeks Will get CBC tomorrow 06/03/24 at the hospital lab to check H&H, ordered by uro F/u w/ uro, Dr. Twylla, in 1-2 weeks   Home Health: no  Equipment/Devices:  Discharge Condition: stable  CODE STATUS: full  Diet recommendation: Regular   Brief/Interim Summary: HPI was taken from Dr. Cleatus: Stephen Bennett. is a 83 y.o. male with medical history significant for Chronic macrocytic anemia attributed to MDS, chronic back pain, tobacco use disorder, right renal calculus with hydronephrosis symptomatic for intermittent hematuria and right flank pain since 11/2023 not yet followed by urology, being admitted for gross hematuria, persistent in spite of CBI during a prior ED visit ton 05/29/2024 when he declined hospitalization.  He reports that he has burning at the tip of his penis and feels like he is unable to empty his bladder.  Denies abdominal pain, fever or chills. In the ED, BP 102/59 with otherwise normal vitals. Labs notable for WBC 18,000 and WBC 8.6, down from baseline of 9.5 on his earlier visit on 8/2.  Creatinine 2.2 above baseline around 1.28 in the past 6 months.  Urinalysis showing red turbid urine without bacteria or WBCs. CT renal stone study pending-> Mild left-sided hydronephrosis and moderate to severe right hydronephrosis and hydroureter, down to the level of the bladder. Catheter in the bladder with partially decompressed bladder; the bladder is filled with hyperdense material, likely blood products/hematoma. There is reflux of hyperdense material into the dilated distal right ureter. Underlying mass not excluded   Patient started on CBI, given a LR bolus and morphine . Admission  requested.   Discharge Diagnoses:  Principal Problem:   Gross hematuria Active Problems:   Right nephrolithiasis   AKI (acute kidney injury) (HCC)   Chronic low back pain   Myelodysplasia (myelodysplastic syndrome) (HCC)   ABLA (acute blood loss anemia)   Tobacco use disorder   Diabetic foot ulcer (HCC)  Gross hematuria: w/ b/l hydronephrosis. 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 Still present. S/p cystoscopy with ureteral fulguration, R ureter stent placement, and biopsy of ureteral tumor with ablation for hemostasis 8/4. H&H are trending down. Foley was d/c 06/02/24 as per uro.  Will f/u tomorrow 06/03/24 for CBC to check to make sure H&H does not continue to drop which was ordered by uro. Discussed w/ the pt that if the pt's blood counts get too low and pt does not receive a blood transfusion that his organs can shut down and he can die from low of blood. Pt verbalized his understanding  Acute blood loss anemia: w/ hx of MDS. S/p 1 unit of pRBC transfused   AKI: likely secondary to obstructive uropathy. Resolved   MDS: management per onco outpatient. Has not been transfusion dependent as per pt.    Chronic low back pain: norco prn   Discharge Instructions  Discharge Instructions     Diet general   Complete by: As directed    Discharge instructions   Complete by: As directed    F/u w/ uro, Dr. Neale office tomorrow, 06/03/24, to get labs checked (CBC) to make sure Hb and Hct are not dropping. Will also need to f/u appt w/ uro, Dr. Twylla, in 1-2 weeks. F/u w/ PCP in 1 week.  Increase activity slowly   Complete by: As directed    No wound care   Complete by: As directed       Allergies as of 06/02/2024   No Known Allergies      Medication List     STOP taking these medications    cephALEXin  500 MG capsule Commonly known as: KEFLEX    traMADol  50 MG tablet Commonly known as: ULTRAM        TAKE these medications    calcium carbonate 500 MG chewable  tablet Commonly known as: TUMS - dosed in mg elemental calcium Chew 2-3 tablets by mouth daily.   cholecalciferol 1000 units tablet Commonly known as: VITAMIN D Take 1,000 Units by mouth daily.   furosemide  20 MG tablet Commonly known as: LASIX  Take 1-2 tablets (20-40 mg total) by mouth daily as needed. For increased morning leg swelling   HYDROcodone -acetaminophen  5-325 MG tablet Commonly known as: NORCO/VICODIN Take 1 tablet by mouth every 6 (six) hours as needed for up to 5 days for moderate pain (pain score 4-6) or severe pain (pain score 7-10). What changed:  when to take this reasons to take this   mupirocin  ointment 2 % Commonly known as: BACTROBAN  Apply to healing wound QD.   nicotine  10 MG inhaler Commonly known as: NICOTROL  Inhale 1 continuous puffing into the lungs as needed for smoking cessation.        No Known Allergies  Consultations: Uro    Procedures/Studies: DG OR UROLOGY CYSTO IMAGE ([facility] ONLY) Result Date: 05/31/2024 There is no interpretation for this exam.  This order is for images obtained during a surgical procedure.  Please See Surgeries Tab for more information regarding the procedure.   CT Renal Stone Study Result Date: 05/31/2024 CLINICAL DATA:  Hematuria EXAM: CT ABDOMEN AND PELVIS WITHOUT CONTRAST TECHNIQUE: Multidetector CT imaging of the abdomen and pelvis was performed following the standard protocol without IV contrast. RADIATION DOSE REDUCTION: This exam was performed according to the departmental dose-optimization program which includes automated exposure control, adjustment of the mA and/or kV according to patient size and/or use of iterative reconstruction technique. COMPARISON:  CT 12/17/2023, 12/07/2019. FINDINGS: Lower chest: Lung bases demonstrate emphysema. Mild scarring left base. No acute airspace disease. Cardiomegaly. Differential cardiac blood pool suggesting anemia Hepatobiliary: Gallstones. No biliary dilatation. Stable 1.6  cm hypodensity in the left hepatic lobe. Pancreas: Unremarkable. No pancreatic ductal dilatation or surrounding inflammatory changes. Spleen: Normal in size without focal abnormality. Adrenals/Urinary Tract: Adrenal glands are normal. 3 mm nonobstructing stone in the lower pole left kidney. Mild left hydronephrosis. Moderate to severe right hydronephrosis with hydroureter, down to the level of the bladder. Catheter in the bladder with displacement of the balloon to the left. Partially decompressed bladder, filled with hyperdense material. There is reflux of hyper dense material into the dilated distal right ureter. Stomach/Bowel: Stomach nonenlarged. No dilated small bowel. No acute bowel wall thickening. Moderate stool burden Vascular/Lymphatic: Advanced aortic atherosclerosis. No aneurysm. No suspicious lymph nodes Reproductive: Negative prostate Other: Negative for free air.  No pelvic effusion. Musculoskeletal: Chronic compression fractures at L2, L3 and L4. Chronic compression fracture at T12. IMPRESSION: 1. Mild left-sided hydronephrosis and moderate to severe right hydronephrosis and hydroureter, down to the level of the bladder. Catheter in the bladder with partially decompressed bladder; the bladder is filled with hyperdense material, likely blood products/hematoma. There is reflux of hyperdense material into the dilated distal right ureter. Underlying mass not excluded. 2. Gallstones. 3. Cardiomegaly. 4. Aortic atherosclerosis.  Aortic Atherosclerosis (ICD10-I70.0). Electronically Signed   By: Luke Bun M.D.   On: 05/31/2024 01:55   (Echo, Carotid, EGD, Colonoscopy, ERCP)    Subjective: Pt c/o wanting to go home today    Discharge Exam: Vitals:   06/02/24 0241 06/02/24 0815  BP: 106/62 108/71  Pulse: 63 60  Resp: 20 15  Temp: 98.3 F (36.8 C) 97.8 F (36.6 C)  SpO2: 97% 96%   Vitals:   06/01/24 1514 06/01/24 2208 06/02/24 0241 06/02/24 0815  BP: 121/61 108/75 106/62 108/71  Pulse:  (!) 59 (!) 105 63 60  Resp: 17 20 20 15   Temp: 98.3 F (36.8 C) 98.4 F (36.9 C) 98.3 F (36.8 C) 97.8 F (36.6 C)  TempSrc:    Oral  SpO2: 98% 98% 97% 96%  Weight:      Height:        General: Pt is alert, awake, not in acute distress. Frail appearing  Cardiovascular:  S1/S2 +, no rubs, no gallops Respiratory: CTA bilaterally, no wheezing, no rhonchi Abdominal: Soft, NT, ND, bowel sounds + Extremities: no edema, no cyanosis    The results of significant diagnostics from this hospitalization (including imaging, microbiology, ancillary and laboratory) are listed below for reference.     Microbiology: Recent Results (from the past 240 hours)  Urine Culture     Status: None   Collection Time: 05/25/24  7:12 PM   Specimen: Urine, Clean Catch  Result Value Ref Range Status   Specimen Description   Final    URINE, CLEAN CATCH Performed at [facility], [address], [place], [place] [address]    Special Requests   Final    NONE Performed at [facility], [address], [place], [place] [address]    Culture   Final    NO GROWTH Performed at [facility], [address], [place], [place] [address]    Report Status 05/27/2024 FINAL  Final  Urine Culture     Status: Abnormal   Collection Time: 05/29/24  9:59 PM   Specimen: Urine, Clean Catch  Result Value Ref Range Status   Specimen Description   Final    URINE, CLEAN CATCH Performed at [facility], [address] [place], [place] [address]    Special Requests   Final    NONE Performed at [facility], [address] [place], [place] [address]    Culture (A)  Final    <10,000 COLONIES/mL INSIGNIFICANT GROWTH Performed at [facility], [address] [place], [place] [address]    Report Status 05/31/2024 FINAL  Final  Urine Culture     Status: None   Collection Time: 05/30/24 11:33 PM   Specimen: Urine, Clean Catch  Result Value Ref Range Status   Specimen  Description   Final    URINE, CLEAN CATCH Performed at [facility], [address] [place], [place] [address]    Special Requests   Final    NONE Performed at [facility], [address] [place], [place] [address]    Culture   Final    NO GROWTH Performed at [facility], [address], [place], [place] [address]    Report Status 06/01/2024 FINAL  Final     Labs: BNP (last 3 results) No results for input(s): BNP in the last 8760 hours. Basic Metabolic Panel: Recent Labs  Lab 05/29/24 2113 05/30/24 2343 06/01/24 0301 06/02/24 0508  NA 138  139 139 138  K 4.2 4.5 3.9 3.8  CL 101 100 108 105  CO2 24 28 27 27   GLUCOSE 111* 141* 104* 95  BUN 33* 43* 36* 28*  CREATININE 1.56* 2.20* 1.41* 0.89  CALCIUM 9.1 9.5 8.3* 8.2*   Liver Function Tests: No results for input(s): AST, ALT, ALKPHOS, BILITOT, PROT, ALBUMIN in the last 168 hours. No results for input(s): LIPASE, AMYLASE in the last 168 hours. No results for input(s): AMMONIA in the last 168 hours. CBC: Recent Labs  Lab 05/29/24 2113 05/30/24 2343 05/31/24 0538 05/31/24 2123 06/01/24 0301 06/02/24 0508  WBC 11.4* 17.9*  --   --  13.2* 7.1  NEUTROABS 7.3  --   --   --   --   --   HGB 9.5* 8.6* 6.8* 8.8* 8.2* 7.7*  HCT 29.4* 26.9*  --   --  25.2* 24.2*  MCV 103.2* 101.9*  --   --  99.6 100.4*  PLT 237 249  --   --  189 163   Cardiac Enzymes: No results for input(s): CKTOTAL, CKMB, CKMBINDEX, TROPONINI in the last 168 hours. BNP: Invalid input(s): POCBNP CBG: No results for input(s): GLUCAP in the last 168 hours. D-Dimer No results for input(s): DDIMER in the last 72 hours. Hgb A1c No results for input(s): HGBA1C in the last 72 hours. Lipid Profile No results for input(s): CHOL, HDL, LDLCALC, TRIG, CHOLHDL, LDLDIRECT in the last 72 hours. Thyroid  function studies No results for input(s): TSH, T4TOTAL, T3FREE, THYROIDAB in  the last 72 hours.  Invalid input(s): FREET3 Anemia work up No results for input(s): VITAMINB12, FOLATE, FERRITIN, TIBC, IRON, RETICCTPCT in the last 72 hours. Urinalysis    Component Value Date/Time   COLORURINE RED (A) 05/30/2024 2333   APPEARANCEUR TURBID (A) 05/30/2024 2333   LABSPEC  05/30/2024 2333    TEST NOT REPORTED DUE TO COLOR INTERFERENCE OF URINE PIGMENT   PHURINE  05/30/2024 2333    TEST NOT REPORTED DUE TO COLOR INTERFERENCE OF URINE PIGMENT   GLUCOSEU (A) 05/30/2024 2333    TEST NOT REPORTED DUE TO COLOR INTERFERENCE OF URINE PIGMENT   HGBUR (A) 05/30/2024 2333    TEST NOT REPORTED DUE TO COLOR INTERFERENCE OF URINE PIGMENT   BILIRUBINUR (A) 05/30/2024 2333    TEST NOT REPORTED DUE TO COLOR INTERFERENCE OF URINE PIGMENT   BILIRUBINUR negative 11/27/2023 1448   KETONESUR (A) 05/30/2024 2333    TEST NOT REPORTED DUE TO COLOR INTERFERENCE OF URINE PIGMENT   PROTEINUR (A) 05/30/2024 2333    TEST NOT REPORTED DUE TO COLOR INTERFERENCE OF URINE PIGMENT   UROBILINOGEN 0.2 11/27/2023 1448   NITRITE (A) 05/30/2024 2333    TEST NOT REPORTED DUE TO COLOR INTERFERENCE OF URINE PIGMENT   LEUKOCYTESUR (A) 05/30/2024 2333    TEST NOT REPORTED DUE TO COLOR INTERFERENCE OF URINE PIGMENT   Sepsis Labs Recent Labs  Lab 05/29/24 2113 05/30/24 2343 06/01/24 0301 06/02/24 0508  WBC 11.4* 17.9* 13.2* 7.1   Microbiology Recent Results (from the past 240 hours)  Urine Culture     Status: None   Collection Time: 05/25/24  7:12 PM   Specimen: Urine, Clean Catch  Result Value Ref Range Status   Specimen Description   Final    URINE, CLEAN CATCH Performed at [facility], [address], [place], [place] [address]    Special Requests   Final    NONE Performed at [facility], [address] [place],  [place] [address]    Culture   Final    NO GROWTH Performed at [facility], [address], [place], [place] [address]    Report  Status 05/27/2024 FINAL  Final  Urine Culture     Status: Abnormal   Collection Time: 05/29/24  9:59 PM   Specimen: Urine, Clean Catch  Result Value Ref Range Status   Specimen Description   Final    URINE, CLEAN CATCH Performed at [facility], [address], [place], [place] [address]    Special Requests   Final    NONE Performed at [facility], [address] [place], [place] [address]    Culture (A)  Final    <10,000 COLONIES/mL INSIGNIFICANT GROWTH Performed at [facility], [address] [place], [place] [address]    Report Status 05/31/2024 FINAL  Final  Urine Culture     Status: None   Collection Time: 05/30/24 11:33 PM   Specimen: Urine, Clean Catch  Result Value Ref Range Status   Specimen Description   Final    URINE, CLEAN CATCH Performed at [facility], [address] [place], [place] [address]    Special Requests   Final    NONE Performed at [facility], [address], [place], [place] [address]    Culture   Final    NO GROWTH Performed at [facility], [address] [place], [place] [address]    Report Status 06/01/2024 FINAL  Final     Time coordinating discharge: 38 minutes  SIGNED:   Anthony CHRISTELLA Pouch, MD  [facility] 06/02/2024, 12:51 PM Pager   If 7PM-7AM, please contact night-coverage [email or web address]

## 2024-06-02 NOTE — Progress Notes (Signed)
 Brief Urology Progress Note  Hemoglobin down today, 7.7. He passed an AM voiding trial but continues to have gross hematuria. I would prefer for him to stay another night for continued CBC monitoring, but he has reported to Dr. Trudy that he prefers to go home today. I'll set him up for AM CBC in our clinic tomorrow morning for recheck.  Reghan Thul, PA-C 06/02/24  12:48 PM

## 2024-06-03 ENCOUNTER — Other Ambulatory Visit
Admission: RE | Admit: 2024-06-03 | Discharge: 2024-06-03 | Disposition: A | Discharge status: Home / Self Care | Attending: Physician Assistant | Admitting: Physician Assistant

## 2024-06-03 ENCOUNTER — Other Ambulatory Visit

## 2024-06-03 ENCOUNTER — Telehealth: Payer: Self-pay

## 2024-06-03 ENCOUNTER — Ambulatory Visit: Payer: Self-pay | Admitting: Physician Assistant

## 2024-06-03 DIAGNOSIS — R310 Gross hematuria: Secondary | ICD-10-CM | POA: Diagnosis not present

## 2024-06-03 LAB — CBC
HCT: 26.3 % — ABNORMAL LOW (ref 39.0–52.0)
Hemoglobin: 8.4 g/dL — ABNORMAL LOW (ref 13.0–17.0)
MCH: 32.6 pg (ref 26.0–34.0)
MCHC: 31.9 g/dL (ref 30.0–36.0)
MCV: 101.9 fL — ABNORMAL HIGH (ref 80.0–100.0)
Platelets: 201 K/uL (ref 150–400)
RBC: 2.58 MIL/uL — ABNORMAL LOW (ref 4.22–5.81)
RDW: 23.4 % — ABNORMAL HIGH (ref 11.5–15.5)
WBC: 6.9 K/uL (ref 4.0–10.5)
nRBC: 0.4 % — ABNORMAL HIGH (ref 0.0–0.2)

## 2024-06-03 NOTE — Telephone Encounter (Signed)
-----   Message from [facility] sent at 06/03/2024  3:52 PM EDT ----- His blood counts are improved today, great news.  Please keep plans for follow-up with Dr. Twylla later this month. ----- Message ----- From: Interface, Lab In [place] Sent: 06/03/2024   1:56 PM EDT To: Samantha Vaillancourt, PA-C

## 2024-06-03 NOTE — Transitions of Care (Post Inpatient/ED Visit) (Signed)
   06/03/2024  Name: Stephen Bennett. MRN: [id number] DOB: 02/09/1941  Today's TOC FU Call Status: Today's TOC FU Call Status:: Unsuccessful Call (1st Attempt) Unsuccessful Call (1st Attempt) Date: 06/03/24  Attempted to reach the patient regarding the most recent Inpatient/ED visit.  Follow Up Plan: Additional outreach attempts will be made to reach the patient to complete the Transitions of Care (Post Inpatient/ED visit) call.   Arvin Seip RN, BSN, CCM [employer], Population Health Case Manager Phone: [phone]

## 2024-06-03 NOTE — Telephone Encounter (Signed)
 Patient aware of results and recommendations.

## 2024-06-04 ENCOUNTER — Telehealth: Payer: Self-pay

## 2024-06-04 NOTE — Transitions of Care (Post Inpatient/ED Visit) (Signed)
   06/04/2024  Name: Stephen Bennett. MRN: [id number] DOB: [birth year]  Today's TOC FU Call Status: Today's TOC FU Call Status:: Unsuccessful Call (2nd Attempt) Unsuccessful Call (2nd Attempt) Date: 06/04/24  Attempted to reach the patient regarding the most recent Inpatient/ED visit.  Follow Up Plan: Additional outreach attempts will be made to reach the patient to complete the Transitions of Care (Post Inpatient/ED visit) call.   Jayah Balthazar J. Dashawn Bartnick RN, MSN [facility], [facility] RN Care Manager Direct Dial: [phone]  Fax: [phone] Website: [email or web address]

## 2024-06-07 ENCOUNTER — Telehealth: Payer: Self-pay

## 2024-06-07 NOTE — Transitions of Care (Post Inpatient/ED Visit) (Signed)
   06/07/2024  Name: Stephen Bennett. MRN: [id number] DOB: 22-Mar-1941  Today's TOC FU Call Status: Today's TOC FU Call Status:: Unsuccessful Call (3rd Attempt) Unsuccessful Call (3rd Attempt) Date: 06/07/24  Attempted to reach the patient regarding the most recent Inpatient/ED visit.  Follow Up Plan: No further outreach attempts will be made at this time. We have been unable to contact the patient.  Alan Ee, RN, BSN, CEN [employer]- Transition of Care Team.  [facility] [phone]

## 2024-06-14 ENCOUNTER — Encounter: Admitting: Internal Medicine

## 2024-06-15 ENCOUNTER — Ambulatory Visit (INDEPENDENT_AMBULATORY_CARE_PROVIDER_SITE_OTHER): Admitting: Urology

## 2024-06-15 VITALS — BP 106/62 | HR 71 | Ht 66.0 in | Wt 113.0 lb

## 2024-06-15 DIAGNOSIS — D4959 Neoplasm of unspecified behavior of other genitourinary organ: Secondary | ICD-10-CM | POA: Diagnosis not present

## 2024-06-15 DIAGNOSIS — R310 Gross hematuria: Secondary | ICD-10-CM | POA: Diagnosis not present

## 2024-06-15 LAB — URINALYSIS, COMPLETE

## 2024-06-15 LAB — MICROSCOPIC EXAMINATION: RBC, Urine: >30 /HPF — AB (ref 0–2)

## 2024-06-16 ENCOUNTER — Telehealth: Payer: Self-pay

## 2024-06-16 NOTE — Progress Notes (Signed)
 06/15/2024 5:28 PM   [name] Dietrich Mickey. February 15, 1941 [id number]  Referring provider: [name], MD [address] [place],  [place] [address]  Chief Complaint  Patient presents with   Hematuria    HPI: Nakhi Choi. is a 83 y.o. male presents for follow-up visit after recent hospitalization for gross hematuria.  Admitted 05/31/2024 for gross hematuria with clot retention.  Several visits for gross hematuria leading up to his hospital admission.  CT on admission showed moderate-severe left hydronephrosis with hydroureter to the UVJ. Prior CT February 2025 showed mild right hydronephrosis with hydroureter to the UVJ Foley catheter was placed and he was started on CBI Taken to OR 05/31/2024.  Minimal clot was in the bladder and there was a blood clot at the right UO.  Retrograde pyelogram demonstrated a filling defect in the right distal ureter measuring 2.5 x 1 cm.  Urine uroscopy showed a mixed papillary, solid tumor of the distal ureter.  Multiple biopsies were taken; pathology showed purulent exudate with limited denuded urothelial mucosa.  Right ureteral stent was placed.  His hematuria resolved and he was discharged 06/02/2024 As noted intermittent mild hematuria since discharge.  Mild stent storage related symptoms   PMH: Past Medical History:  Diagnosis Date   Actinic keratosis 03/27/2023   left pretibial, LN2 04/15/23   Basal cell carcinoma 06/11/2018   L zygoma - excision 10/13/2018   BCC (basal cell carcinoma of skin) 06/11/2018   L cheek 5.0 cm ant to the earlobe   Compression fracture of fourth lumbar vertebra (HCC)    SCC (squamous cell carcinoma) 09/18/2023   left lateral leg ED&C done 09/18/23   SCC (squamous cell carcinoma) 04/28/2024   left elbow - treated with ED&C   Squamous cell carcinoma of skin 04/02/2018   R distal lat calf - ED&C   Squamous cell carcinoma of skin 01/31/2022   L med bicep - ED&C   Squamous cell carcinoma of skin 06/12/2022    R temple - ED&C   Squamous cell carcinoma of skin 07/03/2023   left medial calf, EDC   Squamous cell carcinoma of skin 07/03/2023   right medial calf, EDC    Surgical History: Past Surgical History:  Procedure Laterality Date   CYSTOSCOPY WITH BIOPSY Right 05/31/2024   Procedure: CYSTOSCOPY, WITH BIOPSY;  Surgeon: Twylla Glendia BROCKS, MD;  Location: [facility] ORS;  Service: Urology;  Laterality: Right;   CYSTOSCOPY WITH FULGERATION N/A 05/31/2024   Procedure: CYSTOSCOPY, WITH URETERAL FULGURATION;  Surgeon: Twylla Glendia BROCKS, MD;  Location: [facility] ORS;  Service: Urology;  Laterality: N/A;  possible bladder biopsy and possible tumor resection   CYSTOSCOPY WITH STENT PLACEMENT Right 05/31/2024   Procedure: CYSTOSCOPY, WITH STENT INSERTION;  Surgeon: Twylla Glendia BROCKS, MD;  Location: [facility] ORS;  Service: Urology;  Laterality: Right;   CYSTOSCOPY/URETEROSCOPY/HOLMIUM LASER Right 05/31/2024   Procedure: CYSTOURETEROSCOPY, USING HOLMIUM LASER;  Surgeon: Twylla Glendia BROCKS, MD;  Location: [facility] ORS;  Service: Urology;  Laterality: Right;   SKIN CANCER EXCISION     several    Home Medications:  Allergies as of 06/15/2024   No Known Allergies      Medication List        Accurate as of June 15, 2024 11:59 PM. If you have any questions, ask your nurse or doctor.          STOP taking these medications    mupirocin  ointment 2 % Commonly known as: BACTROBAN  Stopped by: Glendia  C Goldy Calandra       TAKE these medications    calcium carbonate 500 MG chewable tablet Commonly known as: TUMS - dosed in mg elemental calcium Chew 2-3 tablets by mouth daily.   cholecalciferol 1000 units tablet Commonly known as: VITAMIN D Take 1,000 Units by mouth daily.   furosemide  20 MG tablet Commonly known as: LASIX  Take 1-2 tablets (20-40 mg total) by mouth daily as needed. For increased morning leg swelling   nicotine  10 MG inhaler Commonly known as: NICOTROL  Inhale 1 continuous puffing into the lungs as needed  for smoking cessation.        Allergies: No Known Allergies  Family History: Family History  Problem Relation Age of Onset   Cancer Mother    Heart disease Neg Hx    Diabetes Neg Hx     Social History:  reports that he has been smoking cigarettes. He has a 31.5 pack-year smoking history. He has been exposed to tobacco smoke. He has never used smokeless tobacco. He reports that he does not currently use alcohol. He reports that he does not currently use drugs.   Physical Exam: BP 106/62   Pulse 71   Ht 5' 6 (1.676 m)   Wt 113 lb (51.3 kg)   BMI 18.24 kg/m   Constitutional:  Alert, No acute distress. HEENT: [place] AT Respiratory: Normal respiratory effort, no increased work of breathing. Psychiatric: Normal mood and affect.   Assessment & Plan:    1.  Right distal ureteral tumor We discussed intraoperative findings that were suspicious for urothelial carcinoma of the distal ureter Recommend scheduling follow-up ureteroscopy with biopsies and laser ablation of the ureteral tumor The procedure was discussed in detail including potential risks of bleeding and infection. We discussed possible need for further treatment if he is found to have high-grade urothelial carcinoma   Glendia JAYSON Barba, MD  [facility] [address] [place], [place] [address] [phone]

## 2024-06-16 NOTE — Telephone Encounter (Signed)
 Copied from CRM #[id number]. Topic: Clinical - Lab/Test Results >> Jun 16, 2024  4:10 PM Viola F wrote: Reason for CRM: Patient would like Dr. Jimmy to review note/labs from yesterday with urologist Dr. Twylla, he couldn't reschedule his appt yet because he needs to set up a ride

## 2024-06-17 ENCOUNTER — Telehealth: Payer: Self-pay | Admitting: Urology

## 2024-06-17 NOTE — Telephone Encounter (Signed)
 Message left

## 2024-06-17 NOTE — Telephone Encounter (Signed)
 The patient called the office requesting an update on the tumor. After reviewing the notes, he saw a recommendation for surgery to perform biopsies and would like guidance on how to proceed.

## 2024-06-17 NOTE — Telephone Encounter (Signed)
 Discussed with him He needs to proceed with the ureteroscopy as soon as possible--he agrees

## 2024-06-21 ENCOUNTER — Telehealth: Payer: Self-pay

## 2024-06-21 NOTE — Telephone Encounter (Signed)
 Received after hours fax that patient wanted surgery scheduled. Haven't received orders to schedule patients surgery. Send urgent message to physician to send over. Will schedule once orders are received.

## 2024-06-22 NOTE — Telephone Encounter (Signed)
 Patient called to follow up on possible surgery. He said this needs to be done as soon as possible. I advised him that message was sent to doctor on 06/21/24, and that our surgery coordinator would contact him to schedule once orders are received.

## 2024-06-23 ENCOUNTER — Other Ambulatory Visit: Payer: Self-pay | Admitting: Urology

## 2024-06-23 ENCOUNTER — Other Ambulatory Visit: Payer: Self-pay

## 2024-06-23 ENCOUNTER — Encounter: Payer: Self-pay | Admitting: Urology

## 2024-06-23 DIAGNOSIS — D4959 Neoplasm of unspecified behavior of other genitourinary organ: Secondary | ICD-10-CM

## 2024-06-23 NOTE — Progress Notes (Unsigned)
 Surgical Physician Order Form [facility]  Dr. Glendia Barba, MD  * Scheduling expectation : Next Available  *Length of Case: 60 minutes  *Clearance needed: Per anesthesia  *Anticoagulation Instructions: N/A  *Aspirin Instructions: N/A  *Post-op visit Date/Instructions:  1-2 week with pathology review  *Diagnosis: Right distal ureteral tumor  *Procedure: Right ureteroscopy with biopsy right ureteral tumor; laser ablation right ureteral tumor; right ureteral stent exchange   Additional orders: N/A  -Admit type: OUTpatient  -Anesthesia: General  -VTE Prophylaxis Standing Order SCD's       Other:   -Standing Lab Orders Per Anesthesia    Lab other: UA&Urine Culture  -Standing Test orders EKG/Chest x-ray per Anesthesia       Test other:   - Medications: Based on preop urine culture  -Other orders:  N/A

## 2024-06-23 NOTE — Telephone Encounter (Signed)
 Spoke to pt. He is asking if Dr Jimmy could contact Dr Twylla.

## 2024-06-23 NOTE — Telephone Encounter (Signed)
 Copied from CRM [id number]. Topic: General - Other >> Jun 23, 2024  9:13 AM Emylou G wrote: Reason for CRM: Please call patient.. he said he has been waiting on [facility] Glendia [facility] to call back in regards to doing his surgery.. Can we please review he inquired - ( says blood in urine is concerning for him )

## 2024-06-23 NOTE — Telephone Encounter (Signed)
 Spoke to pt.

## 2024-06-24 ENCOUNTER — Telehealth: Payer: Self-pay

## 2024-06-24 NOTE — Progress Notes (Signed)
   [facility] Surgical Posting Form  Surgery Date: Date: 07/22/2024  Surgeon: Dr. Glendia Barba, MD  Inpt ( No  )   Outpt (Yes)   Obs ( No  )   Diagnosis: D49.59 Right Ureteral Tumor  -CPT: 47648, 47645, [id number]  Surgery: Right Ureteroscopy with Biopsy, Laser Ablation of Right Ureteral Tumor, Right Ureteral Stent Exchange  Stop Anticoagulations: N/A  Cardiac/Medical/Pulmonary Clearance needed: no  *Orders entered into EPIC  Date: 06/24/24   *Case booked in [place]  Date: 06/23/2024  *Notified pt of Surgery: Date: 06/23/2024  PRE-OP UA & CX: Yes, will obtain in clinic on 07/08/2024  *Placed into Prior Authorization Work [place] Date: 06/24/24  Assistant/laser/rep:No

## 2024-06-24 NOTE — Telephone Encounter (Signed)
 Per Dr. Twylla, Patient is to be scheduled for Right Ureteroscopy with Biopsy, Laser Ablation of Right Ureteral Tumor, Right Ureteral Stent Exchange   Mr. Conchas was contacted and possible surgical dates were discussed, September 25th, 2025 was agreed upon for surgery.   Patient was instructed that Dr. Twylla will require them to provide a pre-op UA & CX prior to surgery. This was ordered and scheduled drop off appointment was made for 07/08/2024.    Patient was directed to call [phone] between 1-3pm the day before surgery to find out surgical arrival time.  Instructions were given not to eat or drink from midnight on the night before surgery and have a driver for the day of surgery. On the surgery day patient was instructed to enter through the [facility] entrance of [facility] report the Same Day Surgery desk.   Pre-Admit Testing will be in contact via phone to set up an interview with the anesthesia team to review your history and medications prior to surgery.   Reminder of this information was sent via Mail to the patient.

## 2024-07-01 ENCOUNTER — Telehealth: Payer: Self-pay | Admitting: Internal Medicine

## 2024-07-01 ENCOUNTER — Other Ambulatory Visit: Payer: Self-pay | Admitting: Internal Medicine

## 2024-07-01 ENCOUNTER — Encounter: Payer: Medicare Other | Admitting: Internal Medicine

## 2024-07-01 ENCOUNTER — Ambulatory Visit (INDEPENDENT_AMBULATORY_CARE_PROVIDER_SITE_OTHER)

## 2024-07-01 VITALS — Ht 66.0 in | Wt 113.0 lb

## 2024-07-01 DIAGNOSIS — Z0000 Encounter for general adult medical examination without abnormal findings: Secondary | ICD-10-CM | POA: Diagnosis not present

## 2024-07-01 MED ORDER — FUROSEMIDE 20 MG PO TABS: 20.0000 mg | ORAL_TABLET | Freq: Every day | ORAL | 0 refills | Status: DC | PRN

## 2024-07-01 NOTE — Progress Notes (Signed)
 Subjective:   Stephen Bennett. is a 83 y.o. who presents for a Medicare Wellness preventive visit.  As a reminder, Annual Wellness Visits don't include a physical exam, and some assessments may be limited, especially if this visit is performed virtually. We may recommend an in-person follow-up visit with your provider if needed.  Visit Complete: Virtual I connected with  [name] Dietrich Mickey. on 07/01/24 by a audio enabled telemedicine application and verified that I am speaking with the correct person using two identifiers.  Patient Location: Home  Provider Location: Home Office  I discussed the limitations of evaluation and management by telemedicine. The patient expressed understanding and agreed to proceed.  Vital Signs: Because this visit was a virtual/telehealth visit, some criteria may be missing or patient reported. Any vitals not documented were not able to be obtained and vitals that have been documented are patient reported.  VideoDeclined- This patient declined Librarian, academic. Therefore the visit was completed with audio only.  Persons Participating in Visit: Patient.  AWV Questionnaire: No: Patient Medicare AWV questionnaire was not completed prior to this visit.  Cardiac Risk Factors include: advanced age (>39men, >47 women);male gender;sedentary lifestyle;smoking/ tobacco exposure     Objective:    Today's Vitals   07/01/24 1306  Weight: 113 lb (51.3 kg)  Height: 5' 6 (1.676 m)   Body mass index is 18.24 kg/m.     07/01/2024    1:19 PM 05/31/2024    4:35 PM 05/31/2024    2:41 AM 05/30/2024   11:33 PM 05/29/2024    9:23 PM 05/25/2024    6:32 PM 12/17/2023    7:34 AM  Advanced Directives  Does Patient Have a Medical Advance Directive? Yes Yes Yes Yes Yes No Yes  Type of Estate agent of [place];Living will Healthcare Power of [place];Living will Healthcare Power of [place];Living will    Healthcare Power of  [place];Living will  Does patient want to make changes to medical advance directive?  No - Patient declined No - Patient declined    No - Patient declined  Copy of Healthcare Power of Attorney in Chart? Yes - validated most recent copy scanned in chart (See row information) No - copy requested No - copy requested    No - copy requested  Would patient like information on creating a medical advance directive?      No - Patient declined     Current Medications (verified) Outpatient Encounter Medications as of 07/01/2024  Medication Sig   calcium carbonate (TUMS - DOSED IN MG ELEMENTAL CALCIUM) 500 MG chewable tablet Chew 2-3 tablets by mouth daily.   cholecalciferol (VITAMIN D) 1000 units tablet Take 1,000 Units by mouth daily.   furosemide  (LASIX ) 20 MG tablet Take 1-2 tablets (20-40 mg total) by mouth daily as needed. For increased morning leg swelling   nicotine  (NICOTROL ) 10 MG inhaler Inhale 1 continuous puffing into the lungs as needed for smoking cessation.   [DISCONTINUED] furosemide  (LASIX ) 20 MG tablet Take 1-2 tablets (20-40 mg total) by mouth daily as needed. For increased morning leg swelling   No facility-administered encounter medications on file as of 07/01/2024.    Allergies (verified) Patient has no known allergies.   History: Past Medical History:  Diagnosis Date   Actinic keratosis 03/27/2023   left pretibial, LN2 04/15/23   Basal cell carcinoma 06/11/2018   L zygoma - excision 10/13/2018   BCC (basal cell carcinoma of skin) 06/11/2018   L  cheek 5.0 cm ant to the earlobe   Compression fracture of fourth lumbar vertebra (HCC)    SCC (squamous cell carcinoma) 09/18/2023   left lateral leg ED&C done 09/18/23   SCC (squamous cell carcinoma) 04/28/2024   left elbow - treated with ED&C   Squamous cell carcinoma of skin 04/02/2018   R distal lat calf - ED&C   Squamous cell carcinoma of skin 01/31/2022   L med bicep - ED&C   Squamous cell carcinoma of skin 06/12/2022   R  temple - ED&C   Squamous cell carcinoma of skin 07/03/2023   left medial calf, EDC   Squamous cell carcinoma of skin 07/03/2023   right medial calf, EDC   Past Surgical History:  Procedure Laterality Date   CYSTOSCOPY WITH BIOPSY Right 05/31/2024   Procedure: CYSTOSCOPY, WITH BIOPSY;  Surgeon: Twylla Glendia BROCKS, MD;  Location: [facility] ORS;  Service: Urology;  Laterality: Right;   CYSTOSCOPY WITH FULGERATION N/A 05/31/2024   Procedure: CYSTOSCOPY, WITH URETERAL FULGURATION;  Surgeon: Twylla Glendia BROCKS, MD;  Location: [facility] ORS;  Service: Urology;  Laterality: N/A;  possible bladder biopsy and possible tumor resection   CYSTOSCOPY WITH STENT PLACEMENT Right 05/31/2024   Procedure: CYSTOSCOPY, WITH STENT INSERTION;  Surgeon: Twylla Glendia BROCKS, MD;  Location: [facility] ORS;  Service: Urology;  Laterality: Right;   CYSTOSCOPY/URETEROSCOPY/HOLMIUM LASER Right 05/31/2024   Procedure: CYSTOURETEROSCOPY, USING HOLMIUM LASER;  Surgeon: Twylla Glendia BROCKS, MD;  Location: [facility] ORS;  Service: Urology;  Laterality: Right;   SKIN CANCER EXCISION     several   Family History  Problem Relation Age of Onset   Cancer Mother    Heart disease Neg Hx    Diabetes Neg Hx    Social History   Socioeconomic History   Marital status: Divorced    Spouse name: Not on file   Number of children: 0   Years of education: Not on file   Highest education level: Not on file  Occupational History   Occupation: Customer service    Comment: Retired   Occupation: Leisure centre manager    Comment: retired  Tobacco Use   Smoking status: Every Day    Current packs/day: 0.50    Average packs/day: 0.5 packs/day for 63.0 years (31.5 ttl pk-yrs)    Types: Cigarettes    Passive exposure: Past   Smokeless tobacco: Never  Vaping Use   Vaping status: Never Used  Substance and Sexual Activity   Alcohol use: Not Currently    Comment: has not had any alcohol since 2018   Drug use: Not Currently   Sexual activity: Not on file  Other Topics Concern    Not on file  Social History Narrative   No living will      Would want neighbor Jori Neofotis (and her daughter Nat) to make decisions   He does have cousins in [place]    Would accept resuscitation   May accept tube feeds   Social Drivers of Health   Financial Resource Strain: Low Risk  (07/01/2024)   Overall Financial Resource Strain (CARDIA)    Difficulty of Paying Living Expenses: Not hard at all  Food Insecurity: No Food Insecurity (07/01/2024)   Hunger Vital Sign    Worried About Running Out of Food in the Last Year: Never true    Ran Out of Food in the Last Year: Never true  Transportation Needs: No Transportation Needs (07/01/2024)   PRAPARE - Administrator, Civil Service (Medical): No  Lack of Transportation (Non-Medical): No  Physical Activity: Inactive (07/01/2024)   Exercise Vital Sign    Days of Exercise per Week: 0 days    Minutes of Exercise per Session: 0 min  Stress: Stress Concern Present (07/01/2024)   [employer] of Occupational Health - Occupational Stress Questionnaire    Feeling of Stress: To some extent  Social Connections: Moderately Isolated (07/01/2024)   Social Connection and Isolation Panel    Frequency of Communication with Friends and Family: Three times a week    Frequency of Social Gatherings with Friends and Family: Three times a week    Attends Religious Services: More than 4 times per year    Active Member of Clubs or Organizations: No    Attends Banker Meetings: Never    Marital Status: Divorced    Tobacco Counseling Ready to quit: Not Answered Counseling given: Not Answered    Clinical Intake:  Pre-visit preparation completed: Yes  Pain : No/denies pain     BMI - recorded: 18.24 Nutritional Status: BMI <19  Underweight Nutritional Risks: None Diabetes: No  No results found for: HGBA1C   How often do you need to have someone help you when you read instructions, pamphlets, or other written  materials from your doctor or pharmacy?: 1 - Never  Interpreter Needed?: No  Comments: lives alone Information entered by :: B.Raechell Singleton,LPN   Activities of Daily Living     07/01/2024    1:19 PM 05/31/2024    2:41 AM  In your present state of health, do you have any difficulty performing the following activities:  Hearing? 0 1  Vision? 0 0  Difficulty concentrating or making decisions? 0 0  Walking or climbing stairs? 0   Dressing or bathing? 0   Doing errands, shopping? 0 0  Preparing Food and eating ? N   Using the Toilet? N   In the past six months, have you accidently leaked urine? N   Do you have problems with loss of bowel control? N   Managing your Medications? N   Managing your Finances? N   Housekeeping or managing your Housekeeping? N     Patient Care Team: [name], MD as PCP - General (Internal Medicine) Dasie Tinnie MATSU, NP as Nurse Practitioner (Nurse Practitioner)  I have updated your Care Teams any recent Medical Services you may have received from other providers in the past year.     Assessment:   This is a routine wellness examination for [place].  Hearing/Vision screen Hearing Screening - Comments:: Patient denies any hearing difficulties.   Vision Screening - Comments:: Pt says their vision is good without glasses;readers only No eye exam in years-declines referral   Goals Addressed             This Visit's Progress    Patient Stated       130 pushups on Thursday-keep active       Depression Screen     07/01/2024    1:14 PM 06/26/2023    3:34 PM 06/26/2023    3:09 PM 05/08/2023    2:27 PM 12/13/2021    4:11 PM 11/30/2020    3:44 PM 11/25/2019    4:20 PM  PHQ 2/9 Scores  PHQ - 2 Score 0 1 0 0 1 1 0    Fall Risk     07/01/2024    1:09 PM 06/26/2023    3:34 PM 06/26/2023    3:08 PM 05/08/2023  2:27 PM 12/13/2021    4:11 PM  Fall Risk   Falls in the past year? 0 0 0 0 0  Number falls in past yr: 0  0 0   Injury with Fall? 0  0 0    Risk for fall due to : No Fall Risks  No Fall Risks No Fall Risks   Follow up Education provided;Falls prevention discussed  Falls evaluation completed Falls evaluation completed     MEDICARE RISK AT HOME:  Medicare Risk at Home Any stairs in or around the home?: Yes If so, are there any without handrails?: Yes Home free of loose throw rugs in walkways, pet beds, electrical cords, etc?: Yes Adequate lighting in your home to reduce risk of falls?: Yes Life alert?: No Use of a cane, walker or w/c?: No Grab bars in the bathroom?: No Shower chair or bench in shower?: No Elevated toilet seat or a handicapped toilet?: No  TIMED UP AND GO:  Was the test performed?  No  Cognitive Function: 6CIT completed        07/01/2024    1:24 PM  6CIT Screen  What Year? 0 points  What month? 0 points  What time? 0 points  Count back from 20 0 points  Months in reverse 0 points  Repeat phrase 10 points  Total Score 10 points    Immunizations  There is no immunization history on file for this patient.  Screening Tests Health Maintenance  Topic Date Due   HEMOGLOBIN A1C  Never done   FOOT EXAM  Never done   OPHTHALMOLOGY EXAM  Never done   Diabetic kidney evaluation - Urine ACR  Never done   INFLUENZA VACCINE  Never done   Diabetic kidney evaluation - eGFR measurement  06/02/2025   Medicare Annual Wellness (AWV)  07/01/2025   HPV VACCINES  Aged Out   Meningococcal B Vaccine  Aged Out   DTaP/Tdap/Td  Discontinued   Pneumococcal Vaccine: 50+ Years  Discontinued   COVID-19 Vaccine  Discontinued   Zoster Vaccines- Shingrix  Discontinued    Health Maintenance  Health Maintenance Due  Topic Date Due   HEMOGLOBIN A1C  Never done   FOOT EXAM  Never done   OPHTHALMOLOGY EXAM  Never done   Diabetic kidney evaluation - Urine ACR  Never done   INFLUENZA VACCINE  Never done   Health Maintenance Items Addressed: None due at this time. Pt will receive vaccines at their pharmcy when  decided to obtain   Additional Screening:  Vision Screening: Recommended annual ophthalmology exams for early detection of glaucoma and other disorders of the eye. Would you like a referral to an eye doctor? No  declines  Dental Screening: Recommended annual dental exams for proper oral hygiene  Community Resource Referral / Chronic Care Management: CRR required this visit?  No   CCM required this visit?  No   Plan:    I have personally reviewed and noted the following in the patient's chart:   Medical and social history Use of alcohol, tobacco or illicit drugs  Current medications and supplements including opioid prescriptions. Patient is not currently taking opioid prescriptions. Functional ability and status Nutritional status Physical activity Advanced directives List of other physicians Hospitalizations, surgeries, and ER visits in previous 12 months Vitals Screenings to include cognitive, depression, and falls Referrals and appointments  In addition, I have reviewed and discussed with patient certain preventive protocols, quality metrics, and best practice recommendations. A written personalized care  plan for preventive services as well as general preventive health recommendations were provided to patient.   Erminio LITTIE Saris, LPN   0/02/7973   After Visit Summary: (MyChart) Due to this being a telephonic visit, the after visit summary with patients personalized plan was offered to patient via MyChart   Notes: Nothing significant to report at this time.

## 2024-07-01 NOTE — Patient Instructions (Signed)
 Stephen Bennett , Thank you for taking time out of your busy schedule to complete your Annual Wellness Visit with me. I enjoyed our conversation and look forward to speaking with you again next year. I, as well as your care team,  appreciate your ongoing commitment to your health goals. Please review the following plan we discussed and let me know if I can assist you in the future. Your Game plan/ To Do List    Referrals: If you haven't heard from the office you've been referred to, please reach out to them at the phone provided.   Follow up Visits: We will see or speak with you next year for your Next Medicare AWV with our clinical staff Have you seen your provider in the last 6 months (3 months if uncontrolled diabetes)? No  Clinician Recommendations:  Aim for 30 minutes of exercise or brisk walking, 6-8 glasses of water, and 5 servings of fruits and vegetables each day.       This is a list of the screenings recommended for you:  Health Maintenance  Topic Date Due   Hemoglobin A1C  Never done   Complete foot exam   Never done   Eye exam for diabetics  Never done   Yearly kidney health urinalysis for diabetes  Never done   Flu Shot  Never done   Yearly kidney function blood test for diabetes  06/02/2025   Medicare Annual Wellness Visit  07/01/2025   HPV Vaccine  Aged Out   Meningitis B Vaccine  Aged Out   DTaP/Tdap/Td vaccine  Discontinued   Pneumococcal Vaccine for age over 29  Discontinued   COVID-19 Vaccine  Discontinued   Zoster (Shingles) Vaccine  Discontinued    Advanced directives: (In Chart) A copy of your advanced directives are scanned into your chart should your provider ever need it. Advance Care Planning is important because it:  [x]  Makes sure you receive the medical care that is consistent with your values, goals, and preferences  [x]  It provides guidance to your family and loved ones and reduces their decisional burden about whether or not they are making the right  decisions based on your wishes.  Follow the link provided in your after visit summary or read over the paperwork we have mailed to you to help you started getting your Advance Directives in place. If you need assistance in completing these, please reach out to us  so that we can help you!

## 2024-07-01 NOTE — Telephone Encounter (Signed)
 Copied from CRM [id number]. Topic: Clinical - Medication Refill >> Jul 01, 2024  8:40 AM Stephen Bennett wrote: Medication: furosemide  (LASIX ) 20 MG tablet  Has the patient contacted their pharmacy? No (Agent: If no, request that the patient contact the pharmacy for the refill. If patient does not wish to contact the pharmacy document the reason why and proceed with request.) (Agent: If yes, when and what did the pharmacy advise?)  This is the patient's preferred pharmacy:  [id number] [place] - [address] [place] Phone: [phone] Fax: [phone]   Is this the correct pharmacy for this prescription? Yes If no, delete pharmacy and type the correct one.   Has the prescription been filled recently? No  Is the patient out of the medication? No, around 15 pills left.  Has the patient been seen for an appointment in the last year OR does the patient have an upcoming appointment? Yes  Can we respond through MyChart? No  Agent: Please be advised that Rx refills may take up to 3 business days. We ask that you follow-up with your pharmacy.

## 2024-07-01 NOTE — Telephone Encounter (Signed)
 Not on current med list, last fill 05/07/24      Copied from CRM #[id number]. Topic: Clinical - Prescription Issue >> Jul 01, 2024  8:42 AM [place] E wrote: Reason for CRM: Patient stated he needs a refill of his 50 mg tramadol  medication that he takes once a day in the morning, agent could not locate this medication on med list in order to request a refill.

## 2024-07-02 NOTE — Telephone Encounter (Signed)
 I will call him later this morning. He usually sleeps in. He was in the hospital in early August and they gave him a small supply of hydrocodone .

## 2024-07-02 NOTE — Telephone Encounter (Signed)
 I called and spoke to pt. He said exactly what I was saying. He was only given 20 hydrocodone  to aid with the issue with his bladder. He does not like to take them. He needs the tramadol  for his back pain. He has been on it for years and likes it a lot better than hydrocodone .

## 2024-07-03 ENCOUNTER — Other Ambulatory Visit: Payer: Self-pay | Admitting: Family

## 2024-07-03 MED ORDER — TRAMADOL HCL 50 MG PO TABS: 50.0000 mg | ORAL_TABLET | Freq: Every day | ORAL | 1 refills | Status: AC

## 2024-07-05 ENCOUNTER — Telehealth: Payer: Self-pay

## 2024-07-05 NOTE — Telephone Encounter (Signed)
 Pt called in with c/o mucus like urine. Pt stated no blood in it at this time. He states no UTI symptoms he was just making sure this was ok. He states his urine is clear just thicker at times. I did let him know that at any time he can not urinate he needs to seek attention immediately and pt stated understanding. Pt has lab appointment on Thursday for his preop for surgery.

## 2024-07-08 ENCOUNTER — Other Ambulatory Visit

## 2024-07-08 DIAGNOSIS — D4959 Neoplasm of unspecified behavior of other genitourinary organ: Secondary | ICD-10-CM

## 2024-07-08 LAB — MICROSCOPIC EXAMINATION: RBC, Urine: >30 /HPF — AB (ref 0–2)

## 2024-07-08 LAB — URINALYSIS, COMPLETE

## 2024-07-15 ENCOUNTER — Encounter
Admission: RE | Admit: 2024-07-15 | Discharge: 2024-07-15 | Disposition: A | Discharge status: Home / Self Care | Source: Ambulatory Visit | Attending: Urology | Admitting: Urology

## 2024-07-15 ENCOUNTER — Other Ambulatory Visit: Payer: Self-pay

## 2024-07-15 DIAGNOSIS — J439 Emphysema, unspecified: Secondary | ICD-10-CM

## 2024-07-15 DIAGNOSIS — Z01812 Encounter for preprocedural laboratory examination: Secondary | ICD-10-CM

## 2024-07-15 DIAGNOSIS — D508 Other iron deficiency anemias: Secondary | ICD-10-CM

## 2024-07-15 DIAGNOSIS — Z01818 Encounter for other preprocedural examination: Secondary | ICD-10-CM

## 2024-07-15 DIAGNOSIS — Z79899 Other long term (current) drug therapy: Secondary | ICD-10-CM

## 2024-07-15 DIAGNOSIS — N200 Calculus of kidney: Secondary | ICD-10-CM

## 2024-07-15 HISTORY — DX: Localized edema: R60.0

## 2024-07-15 HISTORY — DX: Calculus of kidney: N20.0

## 2024-07-15 HISTORY — DX: Myelodysplastic syndrome, unspecified: D46.9

## 2024-07-15 HISTORY — DX: Venous insufficiency (chronic) (peripheral): I87.2

## 2024-07-15 HISTORY — DX: Other chronic pain: G89.29

## 2024-07-15 HISTORY — DX: Emphysema, unspecified: J43.9

## 2024-07-15 HISTORY — DX: Low back pain, unspecified: M54.50

## 2024-07-15 HISTORY — DX: Nicotine dependence, cigarettes, uncomplicated: F17.210

## 2024-07-15 HISTORY — DX: Neoplasm of unspecified behavior of other genitourinary organ: D49.59

## 2024-07-15 HISTORY — DX: Iron deficiency anemia, unspecified: D50.9

## 2024-07-15 LAB — CULTURE, URINE COMPREHENSIVE

## 2024-07-15 NOTE — Patient Instructions (Addendum)
 Your procedure is scheduled on:07-22-24 Thursday Report to the Registration Desk on the 1st floor of the Medical [facility].Then proceed to the 2nd floor Surgery Desk To find out your arrival time, please call [phone] between 1PM - 3PM on:07-21-24 Wednesday If your arrival time is 6:00 am, do not arrive before that time as the [facility] entrance doors do not open until 6:00 am.  REMEMBER: Instructions that are not followed completely may result in serious medical risk, up to and including death; or upon the discretion of your surgeon and anesthesiologist your surgery may need to be rescheduled.  Do not eat food OR liquids after midnight the night before surgery.  No gum chewing or hard candies.  One week prior to surgery:Stop NOW (07-15-24) Stop Anti-inflammatories (NSAIDS) such as Advil, Aleve, Ibuprofen, Motrin, Naproxen, Naprosyn and Aspirin based products such as Excedrin, Goody's Powder, BC Powder. Stop ANY OVER THE COUNTER supplements until after surgery (Vitamin D, B12)  You may however, continue to take Tylenol  if needed for pain up until the day of surgery.  Continue taking all of your other prescription medications up until the day of surgery.  Do NOT take any medication the day of surgery  No Alcohol for 24 hours before or after surgery.  No Smoking including e-cigarettes for 24 hours before surgery.  No chewable tobacco products for at least 6 hours before surgery.  No nicotine  patches on the day of surgery.  Do not use any recreational drugs for at least a week (preferably 2 weeks) before your surgery.  Please be advised that the combination of cocaine and anesthesia may have negative outcomes, up to and including death. If you test positive for cocaine, your surgery will be cancelled.  On the morning of surgery brush your teeth with toothpaste and water, you may rinse your mouth with mouthwash if you wish. Do not swallow any toothpaste or mouthwash.  Do not wear  jewelry, make-up, hairpins, clips or nail polish.  For welded (permanent) jewelry: bracelets, anklets, waist bands, etc.  Please have this removed prior to surgery.  If it is not removed, there is a chance that hospital personnel will need to cut it off on the day of surgery.  Do not wear lotions, powders, or perfumes.   Do not shave body hair from the neck down 48 hours before surgery.  Contact lenses, hearing aids and dentures may not be worn into surgery.  Do not bring valuables to the hospital. [facility] is not responsible for any missing/lost belongings or valuables.   Notify your doctor if there is any change in your medical condition (cold, fever, infection).  Wear comfortable clothing (specific to your surgery type) to the hospital.  After surgery, you can help prevent lung complications by doing breathing exercises.  Take deep breaths and cough every 1-2 hours. Your doctor may order a device called an Incentive Spirometer to help you take deep breaths. When coughing or sneezing, hold a pillow firmly against your incision with both hands. This is called "splinting." Doing this helps protect your incision. It also decreases belly discomfort.  If you are being admitted to the hospital overnight, leave your suitcase in the car. After surgery it may be brought to your room.  In case of increased patient census, it may be necessary for you, the patient, to continue your postoperative care in the Same Day Surgery department.  If you are being discharged the day of surgery, you will not be allowed to drive  home. You will need a responsible individual to drive you home and stay with you for 24 hours after surgery.   If you are taking public transportation, you will need to have a responsible individual with you.  Please call the Pre-admissions Testing Dept. at [phone] if you have any questions about these instructions.  Surgery Visitation Policy:  Patients having surgery or  a procedure may have two visitors.  Children under the age of 47 must have an adult with them who is not the patient.   Merchandiser, retail to address health-related social needs:  [email or web address]

## 2024-07-16 ENCOUNTER — Ambulatory Visit: Payer: Self-pay | Admitting: Urology

## 2024-07-16 ENCOUNTER — Encounter
Admission: RE | Admit: 2024-07-16 | Discharge: 2024-07-16 | Disposition: A | Discharge status: Home / Self Care | Source: Ambulatory Visit | Attending: Urology | Admitting: Urology

## 2024-07-16 DIAGNOSIS — D4959 Neoplasm of unspecified behavior of other genitourinary organ: Secondary | ICD-10-CM

## 2024-07-16 DIAGNOSIS — D508 Other iron deficiency anemias: Secondary | ICD-10-CM | POA: Diagnosis not present

## 2024-07-16 DIAGNOSIS — Z01812 Encounter for preprocedural laboratory examination: Secondary | ICD-10-CM | POA: Diagnosis not present

## 2024-07-16 DIAGNOSIS — D649 Anemia, unspecified: Secondary | ICD-10-CM | POA: Diagnosis not present

## 2024-07-16 DIAGNOSIS — D7589 Other specified diseases of blood and blood-forming organs: Secondary | ICD-10-CM | POA: Diagnosis not present

## 2024-07-16 DIAGNOSIS — J439 Emphysema, unspecified: Secondary | ICD-10-CM | POA: Insufficient documentation

## 2024-07-16 DIAGNOSIS — Z01818 Encounter for other preprocedural examination: Secondary | ICD-10-CM

## 2024-07-16 DIAGNOSIS — Z79899 Other long term (current) drug therapy: Secondary | ICD-10-CM

## 2024-07-16 LAB — BASIC METABOLIC PANEL WITH GFR
Anion gap: 9 (ref 5–15)
BUN: 31 mg/dL — ABNORMAL HIGH (ref 8–23)
CO2: 29 mmol/L (ref 22–32)
Calcium: 8.7 mg/dL — ABNORMAL LOW (ref 8.9–10.3)
Chloride: 105 mmol/L (ref 98–111)
Creatinine, Ser: 1.43 mg/dL — ABNORMAL HIGH (ref 0.61–1.24)
GFR, Estimated: 49 mL/min — ABNORMAL LOW (ref >60)
Glucose, Bld: 86 mg/dL (ref 70–99)
Potassium: 3.7 mmol/L (ref 3.5–5.1)
Sodium: 143 mmol/L (ref 135–145)

## 2024-07-16 LAB — RETICULOCYTES
Immature Retic Fract: 29.9 % — ABNORMAL HIGH (ref 2.3–15.9)
RBC.: 2.42 MIL/uL — ABNORMAL LOW (ref 4.22–5.81)
Retic Count, Absolute: 38 K/uL (ref 19.0–186.0)
Retic Ct Pct: 1.6 % (ref 0.4–3.1)

## 2024-07-16 LAB — CBC
HCT: 25.4 % — ABNORMAL LOW (ref 39.0–52.0)
Hemoglobin: 7.7 g/dL — ABNORMAL LOW (ref 13.0–17.0)
MCH: 31.8 pg (ref 26.0–34.0)
MCHC: 30.3 g/dL (ref 30.0–36.0)
MCV: 105 fL — ABNORMAL HIGH (ref 80.0–100.0)
Platelets: 159 K/uL (ref 150–400)
RBC: 2.42 MIL/uL — ABNORMAL LOW (ref 4.22–5.81)
RDW: 23.2 % — ABNORMAL HIGH (ref 11.5–15.5)
WBC: 7.9 K/uL (ref 4.0–10.5)
nRBC: 0 % (ref 0.0–0.2)

## 2024-07-16 LAB — TYPE AND SCREEN
ABO/RH(D): O POS
Antibody Screen: NEGATIVE
Extend sample reason: TRANSFUSED

## 2024-07-16 LAB — IRON AND TIBC
Iron: 23 ug/dL — ABNORMAL LOW (ref 45–182)
Saturation Ratios: 11 % — ABNORMAL LOW (ref 17.9–39.5)
TIBC: 211 ug/dL — ABNORMAL LOW (ref 250–450)
UIBC: 188 ug/dL

## 2024-07-16 LAB — FOLATE: Folate: 16.2 ng/mL (ref >5.9)

## 2024-07-16 LAB — FERRITIN: Ferritin: 544 ng/mL — ABNORMAL HIGH (ref 24–336)

## 2024-07-16 LAB — TSH: TSH: 1.524 u[IU]/mL (ref 0.350–4.500)

## 2024-07-16 MED ORDER — CIPROFLOXACIN HCL 250 MG PO TABS: 250.0000 mg | ORAL_TABLET | Freq: Two times a day (BID) | ORAL | 0 refills | Status: DC

## 2024-07-16 NOTE — Progress Notes (Signed)
  [facility] Perioperative Services: Pre-Admission/Anesthesia Testing  Abnormal Lab Notification   Date: 07/16/24  Name: Stephen Bennett. MRN:   [id number]  Re: Abnormal labs noted during PAT appointment   Notified:    Provider Name Provider Role Notification Mode  Stoioff, Glendia BROCKS, MD Urology (Surgeon) Routed and/or faxed via RANELL Jacobo Lye, MD Medical Oncology Routed and/or faxed via [facility]   ABNORMAL LAB VALUE(S):   Lab Results  Component Value Date   HGB 7.7 (L) 07/16/2024   HCT 25.4 (L) 07/16/2024   MCV 105.0 (H) 07/16/2024   MCH 31.8 07/16/2024   RDW 23.2 (H) 07/16/2024   PLT 159 07/16/2024    Clinical Information and Notes:  Taggert Bozzi. is scheduled for a URETEROSCOPY (Right); CYSTOSCOPY, WITH BIOPSY (Right); CYSTOSCOPY, FLEXIBLE, WITH STENT REPLACEMENT (Right) on 07/22/2024.   Recent CBC shows a significant anemia. RBC indices macrocytic.  Patient has been under the care of Dr. Lye Jacobo, MD in the [facility].  Patient followed for anemia of unknown etiology.  In review of documentation from hematology, it appears as if there was a potential working diagnosis of an underlying myelodysplastic syndrome.  Preliminary conversations regarding bone marrow biopsy was discussed.  Patient lost to follow-up after last appointment in 09/2022.  Patient recently admitted back in 05/2024 for ABLA secondary to gross hematuria.  Hemoglobin as low as 6.8 mg/dL.  Patient received a single unit of PRBCs on 05/31/2024.  Patient underwent cystoscopy with ureteral fulguration and RIGHT ureteral stent placement.  Surgical pathology and cytology was negative for high-grade urothelial carcinoma.  Patient scheduled for outpatient follow-up with urology for ongoing care and management.  Patient presents today for preoperative labs prior to upcoming repeat biopsy and stent exchange.  Initial labs demonstrated a significant  anemia.  Additional lab studies were ordered to complete anemia workup as follows  Reticulocytes - 1.6% (ARC 38.0 K/uL). IRF elevated at 29.9%.  TSH - normal at 1.524 uIU/mL  Ferritin - elevated at 544 ng/mL  Iron - 23 ugdL  TIBC/sat - 211 ugdL with a saturation of 11%  B12 - unable to be performed due to limited sample availability (QNS)  Folate - normal at 16.2 ng/mL  Results reviewed as per above.  Patient reporting that he is extremely fatigued. That said, he advised that he continues to work second shift, which is undoubtedly contributory. Sending information to both urology and hematology for review. Patient likely needs to get back in with hematology for ongoing monitoring. With that said, query stability of patient to undergo upcoming procedure without further preoperative PRBC transfusion. Type and screen updated today in anticipation of potential further transfusional needs. Will defer decision to specialty providers; willing to facilitate based on their directives.   No other immediate needs identified at this time.   Dorise Pereyra, MSN, APRN, FNP-C, CEN [facility]  Perioperative Services Nurse Practitioner Phone: [phone] Fax: [phone] 07/16/24 4:38 PM

## 2024-07-20 ENCOUNTER — Telehealth: Payer: Self-pay

## 2024-07-20 NOTE — Telephone Encounter (Signed)
 Patient called in today and was stating that he wanted to cancel his surgery since being placed on antibiotics for infection last week, he has not seen any blood in his urine. I advised patient against cancelling surgery, explained in great detail that he has an area that appears to be a right distal ureteral tumor and that he also currently has a stent that will need to be exchanged. He said he will proceed with surgery. He will call with any questions or concerns.

## 2024-07-22 ENCOUNTER — Ambulatory Visit
Admission: RE | Admit: 2024-07-22 | Discharge: 2024-07-22 | Disposition: A | Discharge status: Home / Self Care | Source: Ambulatory Visit | Attending: Urology | Admitting: Urology

## 2024-07-22 ENCOUNTER — Encounter: Payer: Self-pay | Admitting: Urology

## 2024-07-22 ENCOUNTER — Ambulatory Visit: Payer: Self-pay | Admitting: Urgent Care

## 2024-07-22 ENCOUNTER — Encounter
Admission: RE | Disposition: A | Discharge status: Home / Self Care | Payer: Self-pay | Source: Ambulatory Visit | Attending: Urology

## 2024-07-22 ENCOUNTER — Ambulatory Visit

## 2024-07-22 DIAGNOSIS — D4959 Neoplasm of unspecified behavior of other genitourinary organ: Secondary | ICD-10-CM

## 2024-07-22 DIAGNOSIS — F17210 Nicotine dependence, cigarettes, uncomplicated: Secondary | ICD-10-CM | POA: Insufficient documentation

## 2024-07-22 DIAGNOSIS — J439 Emphysema, unspecified: Secondary | ICD-10-CM | POA: Insufficient documentation

## 2024-07-22 DIAGNOSIS — Z85828 Personal history of other malignant neoplasm of skin: Secondary | ICD-10-CM | POA: Insufficient documentation

## 2024-07-22 DIAGNOSIS — C661 Malignant neoplasm of right ureter: Secondary | ICD-10-CM | POA: Diagnosis not present

## 2024-07-22 DIAGNOSIS — M7989 Other specified soft tissue disorders: Secondary | ICD-10-CM | POA: Diagnosis not present

## 2024-07-22 DIAGNOSIS — J449 Chronic obstructive pulmonary disease, unspecified: Secondary | ICD-10-CM | POA: Diagnosis not present

## 2024-07-22 DIAGNOSIS — Z01818 Encounter for other preprocedural examination: Secondary | ICD-10-CM

## 2024-07-22 DIAGNOSIS — R600 Localized edema: Secondary | ICD-10-CM | POA: Diagnosis not present

## 2024-07-22 HISTORY — PX: CYSTOSCOPY W/ URETERAL STENT PLACEMENT: SHX1429

## 2024-07-22 HISTORY — PX: CYSTOSCOPY/URETEROSCOPY/HOLMIUM LASER/STENT PLACEMENT: SHX6546

## 2024-07-22 HISTORY — PX: CYSTOSCOPY WITH BIOPSY: SHX5122

## 2024-07-22 SURGERY — CYSTOSCOPY, WITH BIOPSY
Anesthesia: General | Laterality: Right

## 2024-07-22 MED ORDER — FENTANYL CITRATE (PF) 100 MCG/2ML IJ SOLN
25.0000 ug | INTRAMUSCULAR | Status: DC | PRN
  Administered 2024-07-22: 25 ug via INTRAVENOUS

## 2024-07-22 MED ORDER — LACTATED RINGERS IV SOLN: INTRAVENOUS | Status: DC | PRN

## 2024-07-22 MED ORDER — DEXAMETHASONE SODIUM PHOSPHATE 10 MG/ML IJ SOLN
INTRAMUSCULAR | Status: AC
  Filled 2024-07-22: qty 1

## 2024-07-22 MED ORDER — PROPOFOL 10 MG/ML IV BOLUS
INTRAVENOUS | Status: AC
Start: 2024-07-22 — End: 2024-07-22
  Filled 2024-07-22: qty 20

## 2024-07-22 MED ORDER — ROCURONIUM BROMIDE 10 MG/ML (PF) SYRINGE
PREFILLED_SYRINGE | INTRAVENOUS | Status: AC
  Filled 2024-07-22: qty 10

## 2024-07-22 MED ORDER — MIDAZOLAM HCL 2 MG/2ML IJ SOLN
INTRAMUSCULAR | Status: AC
  Filled 2024-07-22: qty 2

## 2024-07-22 MED ORDER — ONDANSETRON HCL 4 MG/2ML IJ SOLN
INTRAMUSCULAR | Status: DC | PRN
  Administered 2024-07-22: 4 mg via INTRAVENOUS

## 2024-07-22 MED ORDER — PHENYLEPHRINE 80 MCG/ML (10ML) SYRINGE FOR IV PUSH (FOR BLOOD PRESSURE SUPPORT)
PREFILLED_SYRINGE | INTRAVENOUS | Status: AC
  Filled 2024-07-22: qty 10

## 2024-07-22 MED ORDER — TROSPIUM CHLORIDE 20 MG PO TABS: 20.0000 mg | ORAL_TABLET | Freq: Two times a day (BID) | ORAL | 0 refills | Status: AC | PRN

## 2024-07-22 MED ORDER — CIPROFLOXACIN IN D5W 400 MG/200ML IV SOLN
400.0000 mg | Freq: Once | INTRAVENOUS | Status: AC
  Administered 2024-07-22: 400 mg via INTRAVENOUS

## 2024-07-22 MED ORDER — PROPOFOL 10 MG/ML IV BOLUS
INTRAVENOUS | Status: DC | PRN
Start: 2024-07-22 — End: 2024-07-22
  Administered 2024-07-22: 60 mg via INTRAVENOUS
  Administered 2024-07-22: 20 mg via INTRAVENOUS

## 2024-07-22 MED ORDER — DROPERIDOL 2.5 MG/ML IJ SOLN: 0.6250 mg | Freq: Once | INTRAMUSCULAR | Status: DC | PRN

## 2024-07-22 MED ORDER — CHLORHEXIDINE GLUCONATE 0.12 % MT SOLN
15.0000 mL | Freq: Once | OROMUCOSAL | Status: AC
  Administered 2024-07-22: 15 mL via OROMUCOSAL

## 2024-07-22 MED ORDER — LIDOCAINE HCL (PF) 2 % IJ SOLN
INTRAMUSCULAR | Status: AC
Start: 2024-07-22 — End: 2024-07-22
  Filled 2024-07-22: qty 5

## 2024-07-22 MED ORDER — FENTANYL CITRATE (PF) 100 MCG/2ML IJ SOLN
INTRAMUSCULAR | Status: AC
  Filled 2024-07-22: qty 2

## 2024-07-22 MED ORDER — IOHEXOL 180 MG/ML  SOLN
INTRAMUSCULAR | Status: DC | PRN
  Administered 2024-07-22 (×2): 10 mL

## 2024-07-22 MED ORDER — LIDOCAINE HCL (CARDIAC) PF 100 MG/5ML IV SOSY
PREFILLED_SYRINGE | INTRAVENOUS | Status: DC | PRN
  Administered 2024-07-22: 60 mg via INTRAVENOUS

## 2024-07-22 MED ORDER — EPHEDRINE 5 MG/ML INJ
INTRAVENOUS | Status: AC
Start: 2024-07-22 — End: 2024-07-22
  Filled 2024-07-22: qty 5

## 2024-07-22 MED ORDER — CIPROFLOXACIN IN D5W 400 MG/200ML IV SOLN
INTRAVENOUS | Status: AC
Start: 2024-07-22 — End: 2024-07-22
  Filled 2024-07-22: qty 200

## 2024-07-22 MED ORDER — PHENYLEPHRINE 80 MCG/ML (10ML) SYRINGE FOR IV PUSH (FOR BLOOD PRESSURE SUPPORT)
PREFILLED_SYRINGE | INTRAVENOUS | Status: DC | PRN
  Administered 2024-07-22 (×4): 80 ug via INTRAVENOUS

## 2024-07-22 MED ORDER — LACTATED RINGERS IV SOLN: Freq: Once | INTRAVENOUS | Status: AC

## 2024-07-22 MED ORDER — ONDANSETRON HCL 4 MG/2ML IJ SOLN
INTRAMUSCULAR | Status: AC
  Filled 2024-07-22: qty 2

## 2024-07-22 MED ORDER — SODIUM CHLORIDE 0.9 % IR SOLN
Status: DC | PRN
  Administered 2024-07-22 (×2): 3000 mL

## 2024-07-22 MED ORDER — CHLORHEXIDINE GLUCONATE 0.12 % MT SOLN
OROMUCOSAL | Status: AC
  Filled 2024-07-22: qty 15

## 2024-07-22 MED ORDER — EPHEDRINE SULFATE-NACL 50-0.9 MG/10ML-% IV SOSY
PREFILLED_SYRINGE | INTRAVENOUS | Status: DC | PRN
  Administered 2024-07-22 (×2): 5 mg via INTRAVENOUS

## 2024-07-22 MED ORDER — ORAL CARE MOUTH RINSE: 15.0000 mL | Freq: Once | OROMUCOSAL | Status: AC

## 2024-07-22 MED ORDER — FENTANYL CITRATE (PF) 100 MCG/2ML IJ SOLN
INTRAMUSCULAR | Status: DC | PRN
  Administered 2024-07-22: 25 ug via INTRAVENOUS
  Administered 2024-07-22: 50 ug via INTRAVENOUS
  Administered 2024-07-22: 25 ug via INTRAVENOUS

## 2024-07-22 SURGICAL SUPPLY — 34 items
BAG DRAIN SIEMENS DORNER NS (MISCELLANEOUS) ×1 IMPLANT
BAG PRESSURE INF REUSE 3000 (BAG) ×1 IMPLANT
BASKET 3 PRONG GRASPER (BASKET) IMPLANT
BASKET 3WIRE GEMINI 2.4X120 (MISCELLANEOUS) IMPLANT
BASKET STONE 3X4X90X20 (MISCELLANEOUS) IMPLANT
BASKET ZERO TIP 1.9FR (BASKET) IMPLANT
BRUSH SCRUB EZ 1% IODOPHOR (MISCELLANEOUS) ×1 IMPLANT
BRUSH SCRUB EZ 4% CHG (MISCELLANEOUS) ×1 IMPLANT
CATH URET FLEX-TIP 2 LUMEN 10F (CATHETERS) ×1 IMPLANT
CATH URETL OPEN 5X70 (CATHETERS) ×1 IMPLANT
CNTNR URN SCR LID CUP LEK RST (MISCELLANEOUS) IMPLANT
DRSG TELFA 3X4 N-ADH STERILE (GAUZE/BANDAGES/DRESSINGS) ×1 IMPLANT
ELECTRODE REM PT RTRN 9FT ADLT (ELECTROSURGICAL) ×1 IMPLANT
FIBER LASER MOSES 200 DFL (Laser) IMPLANT
FORCEPS BIOP PIRANHA Y (CUTTING FORCEPS) IMPLANT
GLOVE BIOGEL PI IND STRL 7.5 (GLOVE) ×1 IMPLANT
GOWN STRL REUS W/ TWL LRG LVL3 (GOWN DISPOSABLE) ×2 IMPLANT
GOWN STRL REUS W/ TWL XL LVL3 (GOWN DISPOSABLE) ×1 IMPLANT
GUIDEWIRE GREEN .038 145CM (MISCELLANEOUS) ×1 IMPLANT
GUIDEWIRE STR DUAL SENSOR (WIRE) ×1 IMPLANT
GUIDEWIRE STR ZIPWIRE 035X150 (MISCELLANEOUS) IMPLANT
KIT TURNOVER CYSTO (KITS) ×1 IMPLANT
NDL SAFETY ECLIPSE 18X1.5 (NEEDLE) ×1 IMPLANT
PACK CYSTO AR (MISCELLANEOUS) ×1 IMPLANT
SET CYSTO W/LG BORE CLAMP LF (SET/KITS/TRAYS/PACK) ×1 IMPLANT
SHEATH NAVIGATOR HD 12/14X36 (SHEATH) IMPLANT
SOL .9 NS 3000ML IRR UROMATIC (IV SOLUTION) ×1 IMPLANT
STENT URET 6FRX26 CONTOUR (STENTS) IMPLANT
SURGILUBE 2OZ TUBE FLIPTOP (MISCELLANEOUS) ×1 IMPLANT
SYRINGE TOOMEY IRRIG 70ML (MISCELLANEOUS) IMPLANT
VALVE UROSEAL ADJ ENDO (VALVE) IMPLANT
WATER STERILE IRR 1000ML POUR (IV SOLUTION) ×1 IMPLANT
WATER STERILE IRR 3000ML UROMA (IV SOLUTION) ×1 IMPLANT
WATER STERILE IRR 500ML POUR (IV SOLUTION) ×1 IMPLANT

## 2024-07-22 NOTE — Interval H&P Note (Signed)
 History and Physical Interval Note:  07/22/2024 7:06 AM  Stephen Bennett Dietrich Mickey.  has presented today for surgery, with the diagnosis of Right Ureteral Tumor.  The various methods of treatment have been discussed with the patient and family. After consideration of risks, benefits and other options for treatment, the patient has consented to  Procedure(s): URETEROSCOPY (Right) CYSTOSCOPY, WITH BIOPSY (Right) CYSTOSCOPY, FLEXIBLE, WITH STENT REPLACEMENT (Right) as a surgical intervention.  The patient's history has been reviewed, patient examined, no change in status, stable for surgery.  I have reviewed the patient's chart and labs.  Questions were answered to the patient's satisfaction.     Other Atienza C Ciclaly Mulcahey

## 2024-07-22 NOTE — Discharge Instructions (Signed)
 DISCHARGE INSTRUCTIONS FOR URETERAL STENT   MEDICATIONS:  1. Resume all your other meds from home.  2.  AZO (over-the-counter) can help with the burning/stinging when you urinate. 3.  Trospium  is for bladder pain/spasm, Rx was sent to your pharmacy.  ACTIVITY:  1. May resume regular activities in 24 hours. 2. No driving while on narcotic pain medications  3. Drink plenty of water  4. Continue to walk at home - you can still get blood clots when you are at home, so keep active, but don't over do it.  5. May return to work/school tomorrow or when you feel ready    SIGNS/SYMPTOMS TO CALL:  Common postoperative symptoms include urinary frequency, urgency, bladder spasm and blood in the urine  Please call us  if you have a fever greater than 101.5, uncontrolled nausea/vomiting, uncontrolled pain, dizziness, unable to urinate, excessively bloody urine, chest pain, shortness of breath, leg swelling, leg pain, or any other concerns or questions.   You can reach us  at [phone].   FOLLOW-UP:  1. You will be contacted with your pathology results.  Further follow-up will be scheduled at that time

## 2024-07-22 NOTE — Op Note (Incomplete)
 Preoperative diagnosis:  Right distal ureteral tumor  Postoperative diagnosis:  Right distal ureteral tumor  Procedure:  Cystoscopy Right ureteroscopy with biopsies ureteral tumor Laser ablation right ureteral tumor Right ureteral stent exchange (465F/26 cm) Right retrograde pyelography with interpretation  Surgeon: Glendia C. Setareh Rom, M.D.  Anesthesia: General  Complications: None  Intraoperative findings:  Cystoscopy: Urethra normal in caliber without stricture; prostate with moderate lateral lobe enlargement.  Bladder mucosa without solid or papillary lesions; stent not visualized and had migrated proximally Ureteroscopy: Mixed solid/papillary tumor distal ureter Right retrograde pyelography demonstrated moderate right hydronephrosis with stent in good position   EBL: Minimal  Specimens: Biopsies distal ureteral tumor   Indication: Stephen Bennett. is a 83 y.o.  male with a history of gross hematuria and clot retention.  Cystoscopy 06/02/2024 demonstrated no bladder mucosal abnormalities and clot at the right ureteral orifice..  Ureteroscopy remarkable for mixed papillary and solid tumor of the distal ureter.  Biopsies were obtained however were nondiagnostic.  He presents for follow-up ureteroscopy with biopsies and ureteral stent exchange. After reviewing the management options for treatment, the patient elected to proceed with the above surgical procedure(s). We have discussed the potential benefits and risks of the procedure, side effects of the proposed treatment, the likelihood of the patient achieving the goals of the procedure, and any potential problems that might occur during the procedure or recuperation. Informed consent has been obtained.  Description of procedure:  The patient was taken to the operating room and general anesthesia was induced.  The patient was placed in the dorsal lithotomy position, prepped and draped in the usual sterile fashion, and  preoperative antibiotics were administered. A preoperative time-out was performed.   A 21 French cystoscope was lubricated, placed per urethra and advanced into the bladder under direct vision with findings as described above.  On fluoroscopy the ureter was tortuous and the distal stent curl was proximal to the ureteral tumor.  Attention was directed to the right ureteral orifice and a 0.038 Sensor wire was unable to be advanced up the ureter. A 4.5 Fr semirigid ureteroscope was then advanced into the ureter and tumor was identified as described above.  The Sensor wire was able to be advanced proximally under direct vision.  Multiple biopsies of the tumor were taken with a Segura basket with good tissue obtained.  A 200 m Moses holmium laser fiber was placed to the ureteroscope and partial tumor ablation was achieved primarily for hemostasis at a setting of 0.5 J/5 Hz.  Endoscopically the tumor was high-grade in appearance and complete ablation was not performed due to the likely need of additional surgical management.  The ureteroscope was then advanced proximal to the tumor and the stent was identified.  The stent was unable to be adequately grasped with Piranha forceps.  The distal end of the stent was identified and a 1.9 F 0-tip basket was placed just distal to the tip of the stent and opened.  The basket was then advanced proximally and closed.  The stent was then removed under fluoroscopic guidance without difficulty.  Due to ureteral tortuosity the Sensor wire was unable to be advanced into the renal pelvis.  A 465F open-ended ureteral catheter was advanced over the sensor wire to the area of tortuosity.  A 0.035 ZIPwire was able to be advanced into the renal pelvis when placed through the ureteral catheter and the ureter was straightened.  The catheter was advanced into the renal pelvis and retrograde pyelogram was  performed to confirm correct location with findings as described above.  The  ureteral catheter was replaced and the ZIPwire was exchanged for a Sensor wire.  The sensor wire was backloaded on the cystoscope and a 30F/26 cm Contour ureteral stent was placed on fluoroscopic guidance with good positioning noted in the renal pelvis and bladder.  The bladder was emptied and the cystoscope was removed.  He was transported to the PACU in stable condition.  Plan: He will be contacted with the pathology report and further follow-up recommendations will be determined at that time He was noted in preop to have asymmetric enlargement of his right and an ultrasound of the right lower extremity will be obtained prior to discharge    Glendia Barba, MD

## 2024-07-22 NOTE — Anesthesia Preprocedure Evaluation (Signed)
 Anesthesia Evaluation  Patient identified by MRN, date of birth, ID band Patient awake    Reviewed: Allergy & Precautions, NPO status , Patient's Chart, lab work & pertinent test results  History of Anesthesia Complications Negative for: history of anesthetic complications  Airway Mallampati: III  TM Distance: >3 FB Neck ROM: full    Dental  (+) Edentulous Upper, Edentulous Lower, Dental Advidsory Given   Pulmonary neg shortness of breath, COPD,  COPD inhaler, neg recent URI, Current Smoker and Patient abstained from smoking.   Pulmonary exam normal        Cardiovascular negative cardio ROS Normal cardiovascular exam     Neuro/Psych neg Seizures  Neuromuscular disease  negative psych ROS   GI/Hepatic negative GI ROS, Neg liver ROS,,,  Endo/Other  negative endocrine ROS    Renal/GU Renal disease  negative genitourinary   Musculoskeletal   Abdominal   Peds  Hematology  (+) Blood dyscrasia, anemia   Anesthesia Other Findings Past Medical History: 03/27/2023: Actinic keratosis     Comment:  left pretibial, LN2 04/15/23 06/11/2018: Basal cell carcinoma     Comment:  L zygoma - excision 10/13/2018 06/11/2018: BCC (basal cell carcinoma of skin)     Comment:  L cheek 5.0 cm ant to the earlobe No date: Compression fracture of fourth lumbar vertebra (HCC) 09/18/2023: SCC (squamous cell carcinoma)     Comment:  left lateral leg ED&C done 09/18/23 04/28/2024: SCC (squamous cell carcinoma)     Comment:  left elbow - treated with [facility] 04/02/2018: Squamous cell carcinoma of skin     Comment:  R distal lat calf - ED&C 01/31/2022: Squamous cell carcinoma of skin     Comment:  L med bicep - ED&C 06/12/2022: Squamous cell carcinoma of skin     Comment:  R temple - ED&C 07/03/2023: Squamous cell carcinoma of skin     Comment:  left medial calf, EDC 07/03/2023: Squamous cell carcinoma of skin     Comment:  right medial calf,  EDC  Past Surgical History: No date: SKIN CANCER EXCISION     Comment:  several  BMI    Body Mass Index: 18.36 kg/m      Reproductive/Obstetrics negative OB ROS                              Anesthesia Physical Anesthesia Plan  ASA: 3  Anesthesia Plan: General   Post-op Pain Management: Tylenol  PO (pre-op)*   Induction: Intravenous  PONV Risk Score and Plan: 2 and Ondansetron , Dexamethasone , Midazolam  and Treatment may vary due to age or medical condition  Airway Management Planned: LMA and Oral ETT  Additional Equipment:   Intra-op Plan:   Post-operative Plan: Extubation in OR  Informed Consent: I have reviewed the patients History and Physical, chart, labs and discussed the procedure including the risks, benefits and alternatives for the proposed anesthesia with the patient or authorized representative who has indicated his/her understanding and acceptance.     Dental Advisory Given  Plan Discussed with: Anesthesiologist, CRNA and Surgeon  Anesthesia Plan Comments: (Patient consented for risks of anesthesia including but not limited to:  - adverse reactions to medications - risk of airway placement if required - damage to eyes, teeth, lips or other oral mucosa - nerve damage due to positioning  - sore throat or hoarseness - Damage to heart, brain, nerves, lungs, other parts of body or loss of life  Patient voiced understanding  and assent.)         Anesthesia Quick Evaluation

## 2024-07-22 NOTE — H&P (Signed)
 Urology H&P   Assessment/Recommendations:  1.  Right distal ureteral tumor Presents for right ureteroscopy with biopsies right distal ureteral tumor and laser ablation The procedure has been discussed in detail as per prior office notes We discussed his stent will need to be replaced postoperatively All questions were answered   History of Present Illness: Saw Stephen Bennett. is a 83 y.o. who presents for right ureteroscopy and biopsy/laser ablation of right distal ureteral tumor.  Admitted 05/31/2024 for gross hematuria with clot retention.  Several visits for gross hematuria leading up to his hospital admission.  CT on admission showed moderate-severe right hydronephrosis with hydroureter to the UVJ. Prior CT February 2025 showed mild right hydronephrosis with hydroureter to the UVJ Foley catheter was placed and he was started on CBI Taken to OR 05/31/2024.  Minimal clot was in the bladder and there was a blood clot at the right UO.  Retrograde pyelogram demonstrated a filling defect in the right distal ureter measuring 2.5 x 1 cm.  Urine uroscopy showed a mixed papillary, solid tumor of the distal ureter.  Multiple biopsies were taken; pathology showed purulent exudate with limited denuded urothelial mucosa.  Right ureteral stent was placed.  His hematuria resolved and he was discharged 06/02/2024 Preoperative urine culture was positive and consistent with colonization.  He has been treated with antibiotics.  Past Medical History:  Diagnosis Date   Actinic keratosis 03/27/2023   left pretibial, LN2 04/15/23   Basal cell carcinoma 06/11/2018   L zygoma - excision 10/13/2018   BCC (basal cell carcinoma of skin) 06/11/2018   L cheek 5.0 cm ant to the earlobe   Chronic low back pain    Cigarette smoker    Compression fracture of fourth lumbar vertebra (HCC)    Emphysema lung (HCC)    Leg edema, right    Microcytic anemia    Peripheral venous insufficiency    Right nephrolithiasis     SCC (squamous cell carcinoma) 09/18/2023   left lateral leg ED&C done 09/18/23   SCC (squamous cell carcinoma) 04/28/2024   left elbow - treated with ED&C   Squamous cell carcinoma of skin 04/02/2018   R distal lat calf - ED&C   Squamous cell carcinoma of skin 01/31/2022   L med bicep - ED&C   Squamous cell carcinoma of skin 06/12/2022   R temple - ED&C   Squamous cell carcinoma of skin 07/03/2023   left medial calf, EDC   Squamous cell carcinoma of skin 07/03/2023   right medial calf, EDC   Ureteral tumor     Past Surgical History:  Procedure Laterality Date   CYSTOSCOPY WITH BIOPSY Right 05/31/2024   Procedure: CYSTOSCOPY, WITH BIOPSY;  Surgeon: Twylla Glendia BROCKS, MD;  Location: [facility]S;  Service: Urology;  Laterality: Right;   CYSTOSCOPY WITH FULGERATION N/A 05/31/2024   Procedure: CYSTOSCOPY, WITH URETERAL FULGURATION;  Surgeon: Twylla Glendia BROCKS, MD;  Location: [facility];  Service: Urology;  Laterality: N/A;  possible bladder biopsy and possible tumor resection   CYSTOSCOPY WITH STENT PLACEMENT Right 05/31/2024   Procedure: CYSTOSCOPY, WITH STENT INSERTION;  Surgeon: Twylla Glendia BROCKS, MD;  Location: [facility];  Service: Urology;  Laterality: Right;   CYSTOSCOPY/URETEROSCOPY/HOLMIUM LASER Right 05/31/2024   Procedure: CYSTOURETEROSCOPY, USING HOLMIUM LASER;  Surgeon: Twylla Glendia BROCKS, MD;  Location: [facility];  Service: Urology;  Laterality: Right;   SKIN CANCER EXCISION     several    Home Medications:  Current Meds  Medication Sig  calcium carbonate (TUMS - DOSED IN MG ELEMENTAL CALCIUM) 500 MG chewable tablet Chew 1 tablet by mouth daily.   cholecalciferol (VITAMIN D) 1000 units tablet Take 1,000 Units by mouth daily.   ciprofloxacin  (CIPRO ) 250 MG tablet Take 1 tablet (250 mg total) by mouth every 12 (twelve) hours.   cyanocobalamin  (VITAMIN B12) 1000 MCG tablet Take 1,000 mcg by mouth daily.   furosemide  (LASIX ) 20 MG tablet Take 1-2 tablets (20-40 mg total) by mouth daily as  needed. For increased morning leg swelling (Patient taking differently: Take 20-40 mg by mouth every morning. For increased morning leg swelling)   traMADol  (ULTRAM ) 50 MG tablet Take 1 tablet (50 mg total) by mouth daily after breakfast.    Allergies: No Known Allergies  Family History  Problem Relation Age of Onset   Cancer Mother    Heart disease Neg Hx    Diabetes Neg Hx     Social History:  reports that he has been smoking cigarettes. He has a 31.5 pack-year smoking history. He has been exposed to tobacco smoke. He has never used smokeless tobacco. He reports that he does not currently use alcohol. He reports that he does not currently use drugs.  ROS: No fever, chills, chest pain, shortness of breath  Physical Exam:  Vital signs in last 24 hours: Temp:  [97.9 F (36.6 C)] 97.9 F (36.6 C) (09/25 0634) Pulse Rate:  [63] 63 (09/25 0634) BP: (110)/(56) 110/56 (09/25 0634) SpO2:  [98 %] 98 % (09/25 0634) Weight:  [51.2 kg] 51.2 kg (09/25 0634) Constitutional:  Alert and oriented, No acute distress HEENT: [place] AT, moist mucus membranes.  Trachea midline, no masses Cardiovascular: Regular rate and rhythm Respiratory: Normal respiratory effort, lungs clear bilaterally Skin: No rashes, bruises or suspicious lesions Lymph: No cervical or inguinal adenopathy Neurologic: Grossly intact Psychiatric: Normal mood and affect   Laboratory Data:  No results for input(s): WBC, HGB, HCT in the last 72 hours. No results for input(s): NA, K, CL, CO2, GLUCOSE, BUN, CREATININE, CALCIUM in the last 72 hours. No results for input(s): LABPT, INR in the last 72 hours. No results for input(s): LABURIN in the last 72 hours. Results for orders placed or performed in visit on 07/08/24  CULTURE, URINE COMPREHENSIVE     Status: Abnormal   Collection Time: 07/08/24  1:23 PM   Specimen: Urine   UR  Result Value Ref Range Status   Urine Culture, Comprehensive Final report  (A)  Final   Organism ID, Bacteria Comment (A)  Final    Comment: Staphylococcus epidermidis Based on resistance to oxacillin this isolate would be resistant to all currently available beta-lactam antimicrobial agents, with the exception of the newer cephalosporins with anti-MRSA activity, such as Ceftaroline 10,000-25,000 colony forming units per mL    ANTIMICROBIAL SUSCEPTIBILITY Comment  Final    Comment:       ** S = Susceptible; I = Intermediate; R = Resistant **                    P = Positive; N = Negative             MICS are expressed in micrograms per mL    Antibiotic                 RSLT#1    RSLT#2    RSLT#3    RSLT#4 Ciprofloxacin   S Gentamicin                     S Levofloxacin                   S Linezolid                      S Moxifloxacin                   S Nitrofurantoin                 S Oxacillin                      R Penicillin                     R Rifampin                       S Tetracycline                   S Trimethoprim/Sulfa             R Vancomycin                     S   Microscopic Examination     Status: Abnormal   Collection Time: 07/08/24  1:23 PM   Urine  Result Value Ref Range Status   WBC, UA 6-10 (A) 0 - 5 /hpf Final   RBC, Urine >30 (A) 0 - 2 /hpf Final   Epithelial Cells (non renal) 0-10 0 - 10 /hpf Final   Mucus, UA Present (A) Not Estab. Final   Bacteria, UA Few None seen/Few Final      07/22/2024, 7:01 AM  Glendia Barba,  MD

## 2024-07-22 NOTE — Transfer of Care (Signed)
 Immediate Anesthesia Transfer of Care Note  Patient: Stephen Bennett.  Procedure(s) Performed: URETEROSCOPY (Right) CYSTOSCOPY, WITH BIOPSY (Right) CYSTOSCOPY, WITH RETROGRADE PYELOGRAM  Patient Location: PACU  Anesthesia Type:General  Level of Consciousness: drowsy and patient cooperative  Airway & Oxygen Therapy: Patient Spontanous Breathing and Patient connected to face mask oxygen  Post-op Assessment: Report given to RN and Post -op Vital signs reviewed and stable  Post vital signs: stable  Last Vitals:  Vitals Value Taken Time  BP 109/56 07/22/24 09:35  Temp    Pulse 46 07/22/24 09:37  Resp 22 07/22/24 09:39  SpO2 100 % 07/22/24 09:37  Vitals shown include unfiled device data.  Last Pain:  Vitals:   07/22/24 0634  TempSrc: Temporal  PainSc: 0-No pain      Patients Stated Pain Goal: 0 (07/22/24 9365)  Complications: No notable events documented.

## 2024-07-22 NOTE — Anesthesia Postprocedure Evaluation (Signed)
 Anesthesia Post Note  Patient: Robbie Rideaux.  Procedure(s) Performed: CYSTOSCOPY, WITH BIOPSY (Right) CYSTOSCOPY, WITH RETROGRADE PYELOGRAM AND URETERAL STENT INSERTION (Right) CYSTOSCOPY/URETEROSCOPY/HOLMIUM LASER/STENT PLACEMENT (Right)  Patient location during evaluation: PACU Anesthesia Type: General Level of consciousness: awake and alert Pain management: pain level controlled Vital Signs Assessment: post-procedure vital signs reviewed and stable Respiratory status: spontaneous breathing, nonlabored ventilation, respiratory function stable and patient connected to nasal cannula oxygen Cardiovascular status: blood pressure returned to baseline and stable Postop Assessment: no apparent nausea or vomiting Anesthetic complications: no   No notable events documented.   Last Vitals:  Vitals:   07/22/24 1030 07/22/24 1045  BP: 104/81 102/63  Pulse: (!) 53 (!) 54  Resp: 11 13  Temp:    SpO2: 98% 92%    Last Pain:  Vitals:   07/22/24 1030  TempSrc:   PainSc: 0-No pain                 Debby Mines

## 2024-07-23 ENCOUNTER — Emergency Department
Admission: EM | Admit: 2024-07-23 | Discharge: 2024-07-23 | Disposition: A | Discharge status: Home / Self Care | Attending: Emergency Medicine | Admitting: Emergency Medicine

## 2024-07-23 ENCOUNTER — Encounter: Payer: Self-pay | Admitting: Urology

## 2024-07-23 ENCOUNTER — Other Ambulatory Visit: Payer: Self-pay

## 2024-07-23 DIAGNOSIS — R339 Retention of urine, unspecified: Secondary | ICD-10-CM | POA: Diagnosis not present

## 2024-07-23 DIAGNOSIS — R319 Hematuria, unspecified: Secondary | ICD-10-CM | POA: Insufficient documentation

## 2024-07-23 DIAGNOSIS — E119 Type 2 diabetes mellitus without complications: Secondary | ICD-10-CM | POA: Insufficient documentation

## 2024-07-23 LAB — URINALYSIS, ROUTINE W REFLEX MICROSCOPIC
Bacteria, UA: NONE SEEN
RBC / HPF: >50 RBC/hpf (ref 0–5)
Squamous Epithelial / HPF: 0 /HPF (ref 0–5)
WBC, UA: >50 WBC/hpf (ref 0–5)

## 2024-07-23 LAB — SURGICAL PATHOLOGY

## 2024-07-23 MED ORDER — ACETAMINOPHEN 500 MG PO TABS
1000.0000 mg | ORAL_TABLET | Freq: Once | ORAL | Status: AC
  Administered 2024-07-23: 1000 mg via ORAL
  Filled 2024-07-23: qty 2

## 2024-07-23 MED ORDER — LIDOCAINE HCL URETHRAL/MUCOSAL 2 % EX GEL
1.0000 | Freq: Once | CUTANEOUS | Status: AC
  Administered 2024-07-23: 1 via URETHRAL
  Filled 2024-07-23: qty 6

## 2024-07-23 NOTE — ED Provider Notes (Signed)
 [facility] Provider Note    Event Date/Time   First MD Initiated Contact with Patient 07/23/24 1512     (approximate)   History   Post-op Problem   HPI  Camdon Saetern. is a 83 y.o. male  with history of right distal ureteral tumor who underwent biopsies and laser ablation yesterday, anemia, AKI, diabetes,  and as listed in EMR presents to the emergency department for treatment and evaluation of urinary retention. He last urinated yesterday evening after returning home from his procedure. He has been able to void only small amounts of brown colored urine. No bright red blood or fever.    Physical Exam    Vitals:   07/23/24 1737 07/23/24 1834  BP: (!) 107/55   Pulse:  83  Resp:  18  Temp:    SpO2:  94%    General: Awake, no distress.  CV:  Good peripheral perfusion.  Resp:  Normal effort.  Abd:  No distention.  Other:  Suprapubic tenderness.   ED Results / Procedures / Treatments   Labs (all labs ordered are listed, but only abnormal results are displayed)  Labs Reviewed  URINALYSIS, ROUTINE W REFLEX MICROSCOPIC - Abnormal; Notable for the following components:      Result Value   Color, Urine RED (*)    APPearance TURBID (*)    Glucose, UA   (*)    Value: TEST NOT REPORTED DUE TO COLOR INTERFERENCE OF URINE PIGMENT   Hgb urine dipstick   (*)    Value: TEST NOT REPORTED DUE TO COLOR INTERFERENCE OF URINE PIGMENT   Bilirubin Urine   (*)    Value: TEST NOT REPORTED DUE TO COLOR INTERFERENCE OF URINE PIGMENT   Ketones, ur   (*)    Value: TEST NOT REPORTED DUE TO COLOR INTERFERENCE OF URINE PIGMENT   Protein, ur   (*)    Value: TEST NOT REPORTED DUE TO COLOR INTERFERENCE OF URINE PIGMENT   Nitrite   (*)    Value: TEST NOT REPORTED DUE TO COLOR INTERFERENCE OF URINE PIGMENT   Leukocytes,Ua   (*)    Value: TEST NOT REPORTED DUE TO COLOR INTERFERENCE OF URINE PIGMENT   All other components within normal limits     EKG  Not  indicated.   RADIOLOGY  Image and radiology report reviewed and interpreted by me. Radiology report consistent with the same.  Not indicated.  PROCEDURES:  Critical Care performed: No  Procedures   MEDICATIONS ORDERED IN ED:  Medications  lidocaine  (XYLOCAINE ) 2 % jelly 1 Application (1 Application Urethral Given 07/23/24 1628)  acetaminophen  (TYLENOL ) tablet 1,000 mg (1,000 mg Oral Given 07/23/24 1714)     IMPRESSION / MDM / ASSESSMENT AND PLAN / ED COURSE   I have reviewed the triage note and vital signs. Vital signs stable   Differential diagnosis includes, but is not limited to, urinary retention, urethral obstruction, BPH  Patient's presentation is most consistent with acute illness / injury with system symptoms.  83 year old male presenting to the emergency department for treatment and evaluation of urinary retention.  See HPI for further details.  Foley catheter inserted by nursing staff with return of dark red urine, likely secondary to recent biopsy and laser ablation.  Approximately 400 mL drained and patient reports significant relief of pain.  Patient monitored over an hour after catheter inserted.  Urine continued to drain and pain did not return.  Approximately 500 mL  in the bag on my last assessment.  Home care teaching provided.  He is to call Monday morning to request a follow-up appointment with urology.  He was given strict ER return precautions as well.      FINAL CLINICAL IMPRESSION(S) / ED DIAGNOSES   Final diagnoses:  Urinary retention  Hematuria, unspecified type     Rx / DC Orders   ED Discharge Orders     None        Note:  This document was prepared using Dragon voice recognition software and may include unintentional dictation errors.   Herlinda Kirk NOVAK, FNP 07/23/24 2124    Floy Roberts, MD 07/24/24 [phone]

## 2024-07-23 NOTE — ED Notes (Signed)
 Fall risk bracelet on, nonslip shoes on, and bed alarm on

## 2024-07-23 NOTE — Discharge Instructions (Signed)
 Please call the urologist office Monday morning to schedule a follow-up appointment.  You may prefer to wear the leg bag during the day and use the larger bag at night but you do not have to change them if you do not want to.  Return to the emergency department for increase in pain, if the catheter stops draining, or you develop a fever.

## 2024-07-23 NOTE — ED Notes (Addendum)
 PA Triplett notified of pts urine being bright red

## 2024-07-23 NOTE — ED Notes (Signed)
 Pt and pts friend, Marcey, given instructions on how to empty catheter at home. Pt demonstrated from start to finish and family friend will show pt again when he gets home. Pt ANOx4 at time of demonstration.

## 2024-07-23 NOTE — ED Triage Notes (Signed)
 Patient to ED via POV for post-op problem. Pt reports having bladder surgery yesterday, now unable to urinate.

## 2024-08-03 ENCOUNTER — Ambulatory Visit: Admitting: Urology

## 2024-08-05 ENCOUNTER — Telehealth: Payer: Self-pay | Admitting: Urology

## 2024-08-05 NOTE — Telephone Encounter (Signed)
 Stephen Bennett underwent right ureteroscopy with biopsy of right ureteral tumor 07/22/2024.  He was scheduled for a postop follow-up appointment on 08/03/2024 which he no-showed.  I contacted him to discuss the pathology results and got his voicemail.  He gave consent to leave detailed information on voicemail on his DPR and I elected to leave a voicemail since I will be out of town for 10 days starting tonight.  He was informed his biopsy did show high-grade urothelial carcinoma the ureter and that he will need additional treatment.  Will have the office contact him to schedule follow-up appointment with Dr. Georganne.

## 2024-08-06 NOTE — Telephone Encounter (Signed)
 Left message to return the call to make appt.

## 2024-08-09 NOTE — Telephone Encounter (Signed)
 Looks like patient called back and he was scheduled for that appt.

## 2024-08-11 DIAGNOSIS — C641 Malignant neoplasm of right kidney, except renal pelvis: Secondary | ICD-10-CM | POA: Insufficient documentation

## 2024-08-11 NOTE — Assessment & Plan Note (Signed)
 Fluctuating GFR (0.8 - 2) during urinary retention, clot obstruction, right ureteral obstruction  I am hopeful that true renal function is more represented by his creatinine of 0.8 a couple of months ago.

## 2024-08-11 NOTE — Assessment & Plan Note (Signed)
 Recent GH with acute clot obstruction, with AKI Likely secondary to right distal UTUC  - Close monitoring of GH, bladder emptying - GH may persist, occur intermittently until definitive cancer treatment - CT urogram as above for complete staging, evaluation of left upper tract

## 2024-08-11 NOTE — Assessment & Plan Note (Addendum)
 HGT1 Right distal UTUC (dx 07/22/24)  Right ureteral stent in place Hx of LEFT hydroureteronephrosis + GH + clot obstruction w/ AKI   - Need to fully assess bilateral upper tracts w/ URS, selective cytologies, exclude Left sided disease. Additionally, need full inspection / staging of Right upper tract, will dictate operative plan.  - CT Chest + CT urogram if Cr has stabilized with indwelling stent  - Complete staging and renal recovery will dictate operative management of Right UTUC.    - If unilateral localized disease to distal Right ureter -  gold standard would be radical nephro ureterectomy.  However, if continued borderline GFR or bilateral disease may need to consider segmental distal ureterectomy.

## 2024-08-11 NOTE — Progress Notes (Unsigned)
   08/12/2024 9:10 AM   Stephen Bennett. 07-Jul-1941 [id number]  Reason for visit: Follow up Right UTUC   HPI: 83 y.o. male, initial follow up with me today, previously seen by Dr. Twylla in Oct 2025  Prior HPI: Hx of GH, CT scan with severe Right hydroureteronephrosis to UVJ, Left hydronephrosis, intravesical clot burden  S/p cysto w/ clot evac (05/31/24), Right RPG, Right URS w/ biopsy, Right stent placement   - filling defect Right distal ureter, visible papillary tumor to distal ureter   - path = indeterminate, limited tissue S/p right URS with distal ureteral biopsy (07/22/24) + Right stent exchange   - HGT1 UTUC    - proximal ureter and renal pelvis not visualized  No prior CT w/ delays     Physical Exam: There were no vitals taken for this visit.   Constitutional:  Alert and oriented, No acute distress.  Laboratory Data:  Latest Reference Range & Units 07/16/24 12:24  Creatinine 0.61 - 1.24 mg/dL 8.56 (H)   Cr baseline unclear, as low as Cr 0.89 GFR >60 with foley decompression and ureteral stent placement  Pertinent Imaging: I have personally viewed and interpreted the CT stone (05/31/2024)-severe right hydroureteronephrosis down to distal ureter, very mild left hydronephrosis with a small nonobstructive left lower pole renal stone.  Significant intravesical clot burden with indwelling Foley catheter.     Assessment & Plan:    Urothelial carcinoma of kidney, right [facility]) Assessment & Plan: HGT1 Right distal UTUC (dx 07/22/24)  Right ureteral stent in place Hx of LEFT hydroureteronephrosis + GH + clot obstruction w/ AKI   - Need to fully assess bilateral upper tracts w/ URS, selective cytologies, exclude Left sided disease. Additionally, need full inspection / staging of Right upper tract, will dictate operative plan.  - CT Chest + CT urogram if Cr has stabilized with indwelling stent  - Complete staging and renal recovery will dictate operative management of  Right UTUC.    - If unilateral localized disease to distal Right ureter -  gold standard would be radical nephro ureterectomy.  However, if continued borderline GFR or bilateral disease may need to consider segmental distal ureterectomy.     AKI (acute kidney injury) Assessment & Plan: Fluctuating GFR (0.8 - 2) during urinary retention, clot obstruction, right ureteral obstruction  I am hopeful that true renal function is more represented by his creatinine of 0.8 a couple of months ago.    Gross hematuria Assessment & Plan: Recent GH with acute clot obstruction, with AKI Likely secondary to right distal UTUC  - Close monitoring of GH, bladder emptying - GH may persist, occur intermittently until definitive cancer treatment - CT urogram as above for complete staging, evaluation of left upper tract        Stephen JONELLE Skye, MD  [facility] [address] [place], [place] [address] [phone]

## 2024-08-11 NOTE — H&P (View-Only) (Signed)
   08/12/2024 9:10 AM   Stephen Bennett. 07-Jul-1941 [id number]  Reason for visit: Follow up Right UTUC   HPI: 83 y.o. male, initial follow up with me today, previously seen by Stephen Bennett in Oct 2025  Prior HPI: Hx of GH, CT scan with severe Right hydroureteronephrosis to UVJ, Left hydronephrosis, intravesical clot burden  S/p cysto w/ clot evac (05/31/24), Right RPG, Right URS w/ biopsy, Right stent placement   - filling defect Right distal ureter, visible papillary tumor to distal ureter   - path = indeterminate, limited tissue S/p right URS with distal ureteral biopsy (07/22/24) + Right stent exchange   - HGT1 UTUC    - proximal ureter and renal pelvis not visualized  No prior CT w/ delays     Physical Exam: There were no vitals taken for this visit.   Constitutional:  Alert and oriented, No acute distress.  Laboratory Data:  Latest Reference Range & Units 07/16/24 12:24  Creatinine 0.61 - 1.24 mg/dL 8.56 (H)   Cr baseline unclear, as low as Cr 0.89 GFR >60 with foley decompression and ureteral stent placement  Pertinent Imaging: I have personally viewed and interpreted the CT stone (05/31/2024)-severe right hydroureteronephrosis down to distal ureter, very mild left hydronephrosis with a small nonobstructive left lower pole renal stone.  Significant intravesical clot burden with indwelling Foley catheter.     Assessment & Plan:    Urothelial carcinoma of kidney, right [facility]) Assessment & Plan: HGT1 Right distal UTUC (dx 07/22/24)  Right ureteral stent in place Hx of LEFT hydroureteronephrosis + GH + clot obstruction w/ AKI   - Need to fully assess bilateral upper tracts w/ URS, selective cytologies, exclude Left sided disease. Additionally, need full inspection / staging of Right upper tract, will dictate operative plan.  - CT Chest + CT urogram if Cr has stabilized with indwelling stent  - Complete staging and renal recovery will dictate operative management of  Right UTUC.    - If unilateral localized disease to distal Right ureter -  gold standard would be radical nephro ureterectomy.  However, if continued borderline GFR or bilateral disease may need to consider segmental distal ureterectomy.     AKI (acute kidney injury) Assessment & Plan: Fluctuating GFR (0.8 - 2) during urinary retention, clot obstruction, right ureteral obstruction  I am hopeful that true renal function is more represented by his creatinine of 0.8 a couple of months ago.    Gross hematuria Assessment & Plan: Recent GH with acute clot obstruction, with AKI Likely secondary to right distal UTUC  - Close monitoring of GH, bladder emptying - GH may persist, occur intermittently until definitive cancer treatment - CT urogram as above for complete staging, evaluation of left upper tract        Stephen JONELLE Skye, MD  [facility] [address] [place], [place] [address] [phone]

## 2024-08-12 ENCOUNTER — Encounter

## 2024-08-12 ENCOUNTER — Ambulatory Visit (INDEPENDENT_AMBULATORY_CARE_PROVIDER_SITE_OTHER): Admitting: Urology

## 2024-08-12 VITALS — BP 122/78 | HR 80 | Ht 67.0 in | Wt 120.0 lb

## 2024-08-12 DIAGNOSIS — N179 Acute kidney failure, unspecified: Secondary | ICD-10-CM

## 2024-08-12 DIAGNOSIS — R310 Gross hematuria: Secondary | ICD-10-CM | POA: Diagnosis not present

## 2024-08-12 DIAGNOSIS — C641 Malignant neoplasm of right kidney, except renal pelvis: Secondary | ICD-10-CM

## 2024-08-13 ENCOUNTER — Other Ambulatory Visit: Payer: Self-pay

## 2024-08-13 DIAGNOSIS — C641 Malignant neoplasm of right kidney, except renal pelvis: Secondary | ICD-10-CM

## 2024-08-13 NOTE — Progress Notes (Unsigned)
 Surgical Physician Order Form [facility] [place]  Dr. Georganne, MD  * Scheduling expectation : ~3-4 weeks  *Length of Case: 60 min  *Clearance needed: no  *Anticoagulation Instructions: N/A  *Aspirin Instructions: N/A  *Post-op visit Date/Instructions:  1-2 week follow up  *Diagnosis: Right ureteral carcinoma  *Procedure: cystoscopy, bilateral ureteroscopy, bilateral RPG, Right ureteral stent exchange   Additional orders: N/A  -Admit type: OUTpatient  -Anesthesia: General  -VTE Prophylaxis Standing Order SCD's       Other:   -Standing Lab Orders Per Anesthesia    Lab other: UA&Urine Culture  -Standing Test orders EKG/Chest x-ray per Anesthesia       Test other:   - Medications:  Ancef 2gm IV  -Other orders:  N/A

## 2024-08-16 ENCOUNTER — Telehealth: Payer: Self-pay

## 2024-08-16 NOTE — Progress Notes (Signed)
   [facility] Surgical Posting Form  Surgery Date: Date: 08/24/2024  Surgeon: Dr. [name], MD   Inpt ( No  )   Outpt (Yes)   Obs ( No  )   Diagnosis: C64.1 Right Ureteral Carcinoma  -CPT: 52000, 52351, 74420, 47667  Surgery: Diagnostic Cystoscopy with Bilateral Ureteroscopy and Bilateral Retrograde Pyelograms and Right Ureteral Stent Placement  Stop Anticoagulations: Yes and will need to hold ASA  Cardiac/Medical/Pulmonary Clearance needed: no  *Orders entered into EPIC  Date: 08/16/24   *Case booked in [place]  Date: 08/16/24  *Notified pt of Surgery: Date: 08/16/24  PRE-OP UA & CX: yes, will obtain in clinic tomorrow 08/17/2024  *Placed into Prior Authorization Work [place] Date: 08/16/24  Assistant/laser/rep:No

## 2024-08-16 NOTE — Telephone Encounter (Signed)
 Per Dr. Georganne, Patient is to be scheduled for Diagnostic Cystoscopy with Bilateral Ureteroscopy and Bilateral Retrograde Pyelograms and Right Ureteral Stent Placement   Stephen Bennett was contacted and possible surgical dates were discussed, Tuesday October 28th, 2025 was agreed upon for surgery.   Patient was instructed that Dr. Georganne will require them to provide a pre-op UA & CX prior to surgery. This was ordered and scheduled drop off appointment was made for 08/17/2024.    Patient was directed to call [phone] between 1-3pm the day before surgery to find out surgical arrival time.  Instructions were given not to eat or drink from midnight on the night before surgery and have a driver for the day of surgery. On the surgery day patient was instructed to enter through the Medical Mall entrance of [facility] report the Same Day Surgery desk.   Pre-Admit Testing will be in contact via phone to set up an interview with the anesthesia team to review your history and medications prior to surgery.   Reminder of this information was sent via MyChart to the patient.

## 2024-08-17 ENCOUNTER — Other Ambulatory Visit

## 2024-08-17 DIAGNOSIS — C641 Malignant neoplasm of right kidney, except renal pelvis: Secondary | ICD-10-CM | POA: Diagnosis not present

## 2024-08-17 LAB — MICROSCOPIC EXAMINATION: RBC, Urine: >30 /HPF — AB (ref 0–2)

## 2024-08-17 LAB — URINALYSIS, COMPLETE
Glucose, UA: NEGATIVE
Nitrite, UA: NEGATIVE
Specific Gravity, UA: 1.025 (ref 1.005–1.030)
Urobilinogen, Ur: 1 mg/dL (ref 0.2–1.0)
pH, UA: 7 (ref 5.0–7.5)

## 2024-08-19 ENCOUNTER — Ambulatory Visit: Payer: Self-pay | Admitting: Urology

## 2024-08-19 ENCOUNTER — Ambulatory Visit
Admission: RE | Admit: 2024-08-19 | Discharge: 2024-08-19 | Disposition: A | Discharge status: Home / Self Care | Source: Ambulatory Visit | Attending: Urology | Admitting: Urology

## 2024-08-19 ENCOUNTER — Ambulatory Visit

## 2024-08-19 DIAGNOSIS — N179 Acute kidney failure, unspecified: Secondary | ICD-10-CM | POA: Insufficient documentation

## 2024-08-19 DIAGNOSIS — N132 Hydronephrosis with renal and ureteral calculous obstruction: Secondary | ICD-10-CM | POA: Diagnosis not present

## 2024-08-19 DIAGNOSIS — R310 Gross hematuria: Secondary | ICD-10-CM | POA: Diagnosis not present

## 2024-08-19 DIAGNOSIS — C641 Malignant neoplasm of right kidney, except renal pelvis: Secondary | ICD-10-CM | POA: Insufficient documentation

## 2024-08-19 MED ORDER — SULFAMETHOXAZOLE-TRIMETHOPRIM 800-160 MG PO TABS: 1.0000 | ORAL_TABLET | Freq: Two times a day (BID) | ORAL | 0 refills | Status: AC

## 2024-08-19 MED ORDER — IOHEXOL 300 MG/ML  SOLN: 100.0000 mL | Freq: Once | INTRAMUSCULAR | Status: DC | PRN

## 2024-08-19 MED ORDER — IOHEXOL 300 MG/ML  SOLN
100.0000 mL | Freq: Once | INTRAMUSCULAR | Status: AC | PRN
  Administered 2024-08-19: 100 mL via INTRAVENOUS

## 2024-08-19 NOTE — Telephone Encounter (Signed)
 Unable to reach patient by phone left a detailed voice message that informs him we sent in (Bactrim) a antibiotic after reviewing his urine culture results. He is to take 1 tablet every 12 hours for 5 days. Left our office number in case he had any questions or concerns.-Dimitrius Steedman,CMA

## 2024-08-19 NOTE — Progress Notes (Signed)
 Sending him some antibiotics to begin taking today, leading up to his surgery on Tuesday next week. His UA and pending culture suggest bacteria in the urine- which is not uncommon with a stent in place. Antibiotics will help sterilize the urine prior to procedure.   Can we please call and let him know? Thank you

## 2024-08-20 ENCOUNTER — Telehealth: Payer: Self-pay

## 2024-08-20 NOTE — Telephone Encounter (Signed)
 LVM following up on patients after hour triage call for blood in urine. Stated pt can call us  back if he continues to experience these symptoms.

## 2024-08-21 LAB — CULTURE, URINE COMPREHENSIVE

## 2024-08-23 ENCOUNTER — Other Ambulatory Visit: Payer: Self-pay

## 2024-08-23 ENCOUNTER — Encounter
Admission: RE | Admit: 2024-08-23 | Discharge: 2024-08-23 | Disposition: A | Discharge status: Home / Self Care | Source: Ambulatory Visit | Attending: Urology | Admitting: Urology

## 2024-08-23 HISTORY — DX: Opioid dependence, uncomplicated: F11.20

## 2024-08-23 NOTE — Pre-Procedure Instructions (Signed)
 Unable to reach patient for pre op phone call. Surgery tomorrow. Left VM to return our call.

## 2024-08-23 NOTE — Patient Instructions (Signed)
 Your procedure is scheduled nw:Ulzdijb 08/24/24 Report to the Registration Desk on the 1st floor of the Medical Mall.Then proceed to the 2nd floor Surgery Desk To find out your arrival time, please call [phone] between 1PM - 3PM nw:Fnwijb 08/23/24 If your arrival time is 6:00 am, do not arrive before that time as the Medical Mall entrance doors do not open until 6:00 am.   REMEMBER: Instructions that are not followed completely may result in serious medical risk, up to and including death; or upon the discretion of your surgeon and anesthesiologist your surgery may need to be rescheduled.   Do not eat food OR liquids after midnight the night before surgery.  No gum chewing or hard candies.   One week prior to surgery:Stop NOW (07-15-24) Stop Anti-inflammatories (NSAIDS) such as Advil, Aleve, Ibuprofen, Motrin, Naproxen, Naprosyn and Aspirin based products such as Excedrin, Goody's Powder, BC Powder. Stop ANY OVER THE COUNTER supplements until after surgery (Vitamin D, B12)   You may however, continue to take Tylenol  if needed for pain up until the day of surgery.   Continue taking all of your other prescription medications up until the day of surgery.   Do NOT take any medication the day of surgery   No Alcohol for 24 hours before or after surgery.   No Smoking including e-cigarettes for 24 hours before surgery.  No chewable tobacco products for at least 6 hours before surgery.  No nicotine  patches on the day of surgery.   Do not use any recreational drugs for at least a week (preferably 2 weeks) before your surgery.  Please be advised that the combination of cocaine and anesthesia may have negative outcomes, up to and including death. If you test positive for cocaine, your surgery will be cancelled.   On the morning of surgery brush your teeth with toothpaste and water, you may rinse your mouth with mouthwash if you wish. Do not swallow any toothpaste or mouthwash.   Do not  wear jewelry, make-up, hairpins, clips or nail polish.   For welded (permanent) jewelry: bracelets, anklets, waist bands, etc.  Please have this removed prior to surgery.  If it is not removed, there is a chance that hospital personnel will need to cut it off on the day of surgery.   Do not wear lotions, powders, or perfumes.    Do not shave body hair from the neck down 48 hours before surgery.   Contact lenses, hearing aids and dentures may not be worn into surgery.   Do not bring valuables to the hospital. [facility] is not responsible for any missing/lost belongings or valuables.    Notify your doctor if there is any change in your medical condition (cold, fever, infection).   Wear comfortable clothing (specific to your surgery type) to the hospital.   After surgery, you can help prevent lung complications by doing breathing exercises.  Take deep breaths and cough every 1-2 hours. Your doctor may order a device called an Incentive Spirometer to help you take deep breaths.   If you are being discharged the day of surgery, you will not be allowed to drive home. You will need a responsible individual to drive you home and stay with you for 24 hours after surgery.    Please call the Pre-admissions Testing Dept. at [phone] if you have any questions about these instructions.   Surgery Visitation Policy:   Patients having surgery or a procedure may have two visitors.  Children under  the age of 55 must have an adult with them who is not the patient.     Merchandiser, Retail to address health-related social needs:  [email or web address]

## 2024-08-24 ENCOUNTER — Ambulatory Visit: Payer: Self-pay | Admitting: Urgent Care

## 2024-08-24 ENCOUNTER — Ambulatory Visit

## 2024-08-24 ENCOUNTER — Other Ambulatory Visit: Payer: Self-pay

## 2024-08-24 ENCOUNTER — Ambulatory Visit
Admission: RE | Admit: 2024-08-24 | Discharge: 2024-08-24 | Disposition: A | Discharge status: Home / Self Care | Attending: Urology | Admitting: Urology

## 2024-08-24 ENCOUNTER — Encounter
Admission: RE | Disposition: A | Discharge status: Home / Self Care | Payer: Self-pay | Source: Home / Self Care | Attending: Urology

## 2024-08-24 ENCOUNTER — Encounter: Payer: Self-pay | Admitting: Urology

## 2024-08-24 DIAGNOSIS — D6859 Other primary thrombophilia: Secondary | ICD-10-CM | POA: Diagnosis not present

## 2024-08-24 DIAGNOSIS — N131 Hydronephrosis with ureteral stricture, not elsewhere classified: Secondary | ICD-10-CM | POA: Diagnosis not present

## 2024-08-24 DIAGNOSIS — N1330 Unspecified hydronephrosis: Secondary | ICD-10-CM | POA: Diagnosis not present

## 2024-08-24 DIAGNOSIS — D630 Anemia in neoplastic disease: Secondary | ICD-10-CM | POA: Diagnosis not present

## 2024-08-24 DIAGNOSIS — C641 Malignant neoplasm of right kidney, except renal pelvis: Secondary | ICD-10-CM

## 2024-08-24 DIAGNOSIS — J449 Chronic obstructive pulmonary disease, unspecified: Secondary | ICD-10-CM | POA: Diagnosis not present

## 2024-08-24 DIAGNOSIS — F17200 Nicotine dependence, unspecified, uncomplicated: Secondary | ICD-10-CM | POA: Insufficient documentation

## 2024-08-24 DIAGNOSIS — C661 Malignant neoplasm of right ureter: Secondary | ICD-10-CM | POA: Diagnosis not present

## 2024-08-24 DIAGNOSIS — J439 Emphysema, unspecified: Secondary | ICD-10-CM | POA: Diagnosis not present

## 2024-08-24 HISTORY — PX: URETEROSCOPY: SHX842

## 2024-08-24 HISTORY — PX: CYSTOSCOPY WITH STENT PLACEMENT: SHX5790

## 2024-08-24 HISTORY — PX: CYSTOSCOPY W/ RETROGRADES: SHX1426

## 2024-08-24 SURGERY — URETEROSCOPY
Anesthesia: General | Site: Ureter | Laterality: Right

## 2024-08-24 MED ORDER — ACETAMINOPHEN 10 MG/ML IV SOLN
INTRAVENOUS | Status: AC
  Filled 2024-08-24: qty 100

## 2024-08-24 MED ORDER — LACTATED RINGERS IV SOLN: INTRAVENOUS | Status: DC

## 2024-08-24 MED ORDER — ACETAMINOPHEN 10 MG/ML IV SOLN
INTRAVENOUS | Status: DC | PRN
  Administered 2024-08-24: 1000 mg via INTRAVENOUS

## 2024-08-24 MED ORDER — STERILE WATER FOR IRRIGATION IR SOLN
Status: DC | PRN
Start: 2024-08-24 — End: 2024-08-24
  Administered 2024-08-24: 500 mL

## 2024-08-24 MED ORDER — IOHEXOL 180 MG/ML  SOLN
INTRAMUSCULAR | Status: DC | PRN
  Administered 2024-08-24: 20 mL

## 2024-08-24 MED ORDER — ONDANSETRON HCL 4 MG/2ML IJ SOLN: 4.0000 mg | Freq: Once | INTRAMUSCULAR | Status: DC | PRN

## 2024-08-24 MED ORDER — CEFAZOLIN SODIUM-DEXTROSE 2-4 GM/100ML-% IV SOLN
2.0000 g | INTRAVENOUS | Status: AC
  Administered 2024-08-24: 2 g via INTRAVENOUS

## 2024-08-24 MED ORDER — ACETAMINOPHEN 10 MG/ML IV SOLN: 1000.0000 mg | Freq: Once | INTRAVENOUS | Status: DC | PRN

## 2024-08-24 MED ORDER — OXYCODONE HCL 5 MG/5ML PO SOLN: 5.0000 mg | Freq: Once | ORAL | Status: DC | PRN

## 2024-08-24 MED ORDER — SODIUM CHLORIDE 0.9 % IV SOLN: INTRAVENOUS | Status: DC

## 2024-08-24 MED ORDER — PROPOFOL 10 MG/ML IV BOLUS
INTRAVENOUS | Status: DC | PRN
  Administered 2024-08-24: 20 mg via INTRAVENOUS
  Administered 2024-08-24: 100 mg via INTRAVENOUS

## 2024-08-24 MED ORDER — LIDOCAINE HCL (CARDIAC) PF 100 MG/5ML IV SOSY
PREFILLED_SYRINGE | INTRAVENOUS | Status: DC | PRN
  Administered 2024-08-24: 80 mg via INTRAVENOUS

## 2024-08-24 MED ORDER — ALBUTEROL SULFATE HFA 108 (90 BASE) MCG/ACT IN AERS
INHALATION_SPRAY | RESPIRATORY_TRACT | Status: DC | PRN
  Administered 2024-08-24: 2 via RESPIRATORY_TRACT

## 2024-08-24 MED ORDER — FENTANYL CITRATE (PF) 100 MCG/2ML IJ SOLN
INTRAMUSCULAR | Status: DC | PRN
  Administered 2024-08-24: 25 ug via INTRAVENOUS
  Administered 2024-08-24: 50 ug via INTRAVENOUS
  Administered 2024-08-24: 25 ug via INTRAVENOUS

## 2024-08-24 MED ORDER — SODIUM CHLORIDE 0.9 % IR SOLN
Status: DC | PRN
  Administered 2024-08-24 (×2): 3000 mL

## 2024-08-24 MED ORDER — OXYCODONE HCL 5 MG PO TABS: 5.0000 mg | ORAL_TABLET | Freq: Once | ORAL | Status: DC | PRN

## 2024-08-24 MED ORDER — CEFAZOLIN SODIUM-DEXTROSE 2-4 GM/100ML-% IV SOLN
INTRAVENOUS | Status: AC
  Filled 2024-08-24: qty 100

## 2024-08-24 MED ORDER — CHLORHEXIDINE GLUCONATE 0.12 % MT SOLN
15.0000 mL | Freq: Once | OROMUCOSAL | Status: AC
  Administered 2024-08-24: 15 mL via OROMUCOSAL

## 2024-08-24 MED ORDER — CHLORHEXIDINE GLUCONATE 0.12 % MT SOLN
OROMUCOSAL | Status: AC
Start: 2024-08-24 — End: 2024-08-24
  Filled 2024-08-24: qty 15

## 2024-08-24 MED ORDER — FENTANYL CITRATE (PF) 100 MCG/2ML IJ SOLN: 25.0000 ug | INTRAMUSCULAR | Status: DC | PRN

## 2024-08-24 MED ORDER — DEXAMETHASONE SOD PHOSPHATE PF 10 MG/ML IJ SOLN
INTRAMUSCULAR | Status: DC | PRN
  Administered 2024-08-24: 10 mg via INTRAVENOUS

## 2024-08-24 MED ORDER — ORAL CARE MOUTH RINSE: 15.0000 mL | Freq: Once | OROMUCOSAL | Status: AC

## 2024-08-24 MED ORDER — EPHEDRINE SULFATE-NACL 50-0.9 MG/10ML-% IV SOSY
PREFILLED_SYRINGE | INTRAVENOUS | Status: DC | PRN
  Administered 2024-08-24 (×2): 5 mg via INTRAVENOUS

## 2024-08-24 MED ORDER — FENTANYL CITRATE (PF) 100 MCG/2ML IJ SOLN
INTRAMUSCULAR | Status: AC
  Filled 2024-08-24: qty 2

## 2024-08-24 MED ORDER — ONDANSETRON HCL 4 MG/2ML IJ SOLN
INTRAMUSCULAR | Status: DC | PRN
  Administered 2024-08-24: 4 mg via INTRAVENOUS

## 2024-08-24 SURGICAL SUPPLY — 13 items
BAG DRAIN SIEMENS DORNER NS (MISCELLANEOUS) ×4 IMPLANT
BRUSH SCRUB EZ 4% CHG (MISCELLANEOUS) ×4 IMPLANT
CATH URETL OPEN 5X70 (CATHETERS) ×4 IMPLANT
DRAPE UTILITY 15X26 TOWEL STRL (DRAPES) ×4 IMPLANT
GLOVE BIOGEL PI IND STRL 7.0 (GLOVE) ×4 IMPLANT
GOWN STRL REUS W/ TWL LRG LVL3 (GOWN DISPOSABLE) ×8 IMPLANT
GUIDEWIRE STR DUAL SENSOR (WIRE) ×4 IMPLANT
KIT TURNOVER CYSTO (KITS) ×4 IMPLANT
PACK CYSTO AR (MISCELLANEOUS) ×4 IMPLANT
SET CYSTO IRRIGATION (SET/KITS/TRAYS/PACK) ×4 IMPLANT
SOL .9 NS 3000ML IRR UROMATIC (IV SOLUTION) ×4 IMPLANT
SURGILUBE 2OZ TUBE FLIPTOP (MISCELLANEOUS) ×4 IMPLANT
WATER STERILE IRR 500ML POUR (IV SOLUTION) ×4 IMPLANT

## 2024-08-24 NOTE — Anesthesia Postprocedure Evaluation (Signed)
 Anesthesia Post Note  Patient: [name].  Procedure(s) Performed: URETEROSCOPY (Bilateral: Ureter) CYSTOSCOPY, WITH RETROGRADE PYELOGRAM (Bilateral: Ureter) CYSTOSCOPY, WITH STENT INSERTION (Right: Ureter)  Patient location during evaluation: PACU Anesthesia Type: General Level of consciousness: awake and alert, oriented and patient cooperative Pain management: pain level controlled Vital Signs Assessment: post-procedure vital signs reviewed and stable Respiratory status: spontaneous breathing, nonlabored ventilation and respiratory function stable Cardiovascular status: blood pressure returned to baseline and stable Postop Assessment: adequate PO intake Anesthetic complications: no   No notable events documented.   Last Vitals:  Vitals:   08/24/24 1234 08/24/24 1245  BP: 104/65 (!) 108/55  Pulse: 78   Resp: 11   Temp: (!) 35.9 C   SpO2: 100%     Last Pain:  Vitals:   08/24/24 1030  TempSrc: Temporal                 Stephen Bennett

## 2024-08-24 NOTE — Anesthesia Preprocedure Evaluation (Addendum)
 Anesthesia Evaluation  Patient identified by MRN, date of birth, ID band Patient awake    Reviewed: Allergy & Precautions, NPO status , Patient's Chart, lab work & pertinent test results  History of Anesthesia Complications Negative for: history of anesthetic complications  Airway Mallampati: I   Neck ROM: Full    Dental  (+) Upper Dentures, Lower Dentures   Pulmonary COPD, Current Smoker (1-2 ppd)Patient did not abstain from smoking.   Pulmonary exam normal breath sounds clear to auscultation       Cardiovascular Normal cardiovascular exam Rhythm:Regular Rate:Normal  ECG 05/31/24:  Sinus rhythm with Premature supraventricular complexes Low voltage QRS   Neuro/Psych Chronic pain    GI/Hepatic negative GI ROS,,,  Endo/Other  negative endocrine ROS    Renal/GU      Musculoskeletal   Abdominal   Peds  Hematology  (+) Blood dyscrasia, anemia   Anesthesia Other Findings   Reproductive/Obstetrics                              Anesthesia Physical Anesthesia Plan  ASA: 3  Anesthesia Plan: General   Post-op Pain Management:    Induction: Intravenous  PONV Risk Score and Plan: 1 and Ondansetron , Dexamethasone  and Treatment may vary due to age or medical condition  Airway Management Planned: LMA  Additional Equipment:   Intra-op Plan:   Post-operative Plan: Extubation in OR  Informed Consent: I have reviewed the patients History and Physical, chart, labs and discussed the procedure including the risks, benefits and alternatives for the proposed anesthesia with the patient or authorized representative who has indicated his/her understanding and acceptance.     Dental advisory given  Plan Discussed with: CRNA  Anesthesia Plan Comments: (Patient consented for risks of anesthesia including but not limited to:  - adverse reactions to medications - damage to eyes, teeth, lips or other  oral mucosa - nerve damage due to positioning  - sore throat or hoarseness - damage to heart, brain, nerves, lungs, other parts of body or loss of life  Informed patient about role of CRNA in peri- and intra-operative care.  Patient voiced understanding.)         Anesthesia Quick Evaluation

## 2024-08-24 NOTE — Transfer of Care (Signed)
 Immediate Anesthesia Transfer of Care Note  Patient: Stephen Bennett.  Procedure(s) Performed: URETEROSCOPY (Bilateral: Ureter) CYSTOSCOPY, WITH RETROGRADE PYELOGRAM (Bilateral: Ureter) CYSTOSCOPY, WITH STENT INSERTION (Right: Ureter)  Patient Location: PACU  Anesthesia Type:General  Level of Consciousness: drowsy and patient cooperative  Airway & Oxygen Therapy: Patient Spontanous Breathing and Patient connected to face mask oxygen  Post-op Assessment: Report given to RN, Post -op Vital signs reviewed and stable, and Patient moving all extremities X 4  Post vital signs: Reviewed and stable  Last Vitals:  Vitals Value Taken Time  BP 104/65 08/24/24 12:34  Temp    Pulse 60 08/24/24 12:38  Resp 15 08/24/24 12:38  SpO2 100 % 08/24/24 12:38  Vitals shown include unfiled device data.  Last Pain:  Vitals:   08/24/24 1030  TempSrc: Temporal         Complications: No notable events documented.

## 2024-08-24 NOTE — Anesthesia Procedure Notes (Signed)
 Procedure Name: LMA Insertion Date/Time: 08/24/2024 11:30 AM  Performed by: Lennie Lamarr HERO, CRNAPre-anesthesia Checklist: Patient identified, Emergency Drugs available, Suction available and Patient being monitored Patient Re-evaluated:Patient Re-evaluated prior to induction Oxygen Delivery Method: Circle System Utilized Preoxygenation: Pre-oxygenation with 100% oxygen Induction Type: IV induction Ventilation: Mask ventilation without difficulty LMA: LMA inserted LMA Size: 4.0 Number of attempts: 1 Placement Confirmation: positive ETCO2 and breath sounds checked- equal and bilateral Tube secured with: Tape Dental Injury: Teeth and Oropharynx as per pre-operative assessment

## 2024-08-24 NOTE — Op Note (Signed)
 Preoperative diagnosis:  Right ureteral urothelial cancer Right obstructive hydronephrosis   Postoperative diagnosis:  Right ureteral urothelial cancer Right obstructive hydronephrosis   Procedure:  Cystoscopy Right diagnostic ureteroscopy  Right retrograde pyelography with interpretation  Right ureteral stent placement  Surgeon: [name], MD  Anesthesia: General  Complications: None  Intraoperative findings: Proximal migration of existing right ureteral stent, tail within the distal mid ureter  Significant right distal ureteral tumor burden, fully obstructive.  Tumor burden extended past the pelvic brim Flexible and semirigid ureteroscopy performed to attempt to remove existing ureteral stent.  Unable to perform due to poor visualization and significant tumor We were unable to assess more proximal than mid ureter RPG demonstrated moderate to severe right hydronephrosis, likely secondary to stent malposition A second 25F x 28cm JJ ureteral stent without string was placed, with appropriate position  EBL: Minimal  Specimens: None  Indication: Stephen Bennett. is a 83 y.o. patient with: Right distal HGT1 UTUC w/ residual tumor and ongoing GH  CTU - enhancing 3cm Right distal ureteral cancer, no Left sided disease, no regional LNA  After reviewing the management options for treatment, he elected to proceed with the above surgical procedure(s). We have discussed the potential benefits and risks of the procedure, side effects of the proposed treatment, the likelihood of the patient achieving the goals of the procedure, and any potential problems that might occur during the procedure or recuperation. Informed consent has been obtained.  Description of procedure:  The patient was taken to the operating room and general anesthesia was induced.  The patient was placed in the dorsal lithotomy position, prepped and draped in the usual sterile fashion, and preoperative antibiotics  were administered. A preoperative time-out was performed.   Cystourethroscopy was performed.  The patient's urethra was examined and was normal. The bladder was then systematically examined in its entirety.  There was mild clot burden within the bladder.  There were no bladder tumors or masses.  The left ureteral orifice was normal in appearance. We did not immediately visualize the pre-existing right ureteral stent curl within the bladder.  A spot KUB confirmed malposition, with the distal tail within the distal mid ureter.  We attempted to pass a wire and cannulate the UO but were unsuccessful due to significant tumor burden.  The tumor was visible at the UO but not emanating into the bladder.  Next, we tried semirigid ureteroscopy--which was successful in carefully navigating alongside the tumor burden, although with significant difficulty and poor visualization.  We were able to pass a new sensor guidewire proximally into the renal pelvis which was confirmed with an open-ended catheter and retrograde pyelogram.  Notably there was moderate to severe right hydronephrosis.  Next we attempted several maneuvers to try and remove the malpositioned ureteral stent.  We were unsuccessful with multiple graspers.  Visualization was quite poor due to ongoing hematuria and tumor debris.  As such, we elected to place a new and longer ureteral stent and leave the pre-existing stent in situ, acknowledging that his next operative steps would be a radical nephroureterectomy. A new 25F x 28 cm JJ ureteral stent without string was passed after back loading the cystoscope.  The stent was deployed with appropriate proximal and distal curls within the renal pelvis and bladder respectively.  The bladder was emptied and all instruments removed this concluded the procedure.  Plan: -Follow-up with me in clinic in ~1-2 weeks -Unfortunately I do not think he is a good candidate for segmental  ureterectomy due to tumor burden and need  for a high ureteral transection - Recommend right robotic radical nephro ureterectomy - Will need to review detailed procedure, major surgical clearance, cardiovascular optimization prior   [name], M.D.

## 2024-08-24 NOTE — Interval H&P Note (Signed)
 History and Physical Interval Note:  08/24/2024 11:11 AM  Stephen Bennett.  has presented today for surgery, with the diagnosis of Right Ureteral Carcinoma.  The various methods of treatment have been discussed with the patient and family. After consideration of risks, benefits and other options for treatment, the patient has consented to  Procedure(s) with comments: CYSTOSCOPY (N/A) - DIAGNOSTIC URETEROSCOPY (Bilateral) CYSTOSCOPY, WITH RETROGRADE PYELOGRAM (Bilateral) CYSTOSCOPY, FLEXIBLE, WITH STENT REPLACEMENT (Right) as a surgical intervention.  The patient's history has been reviewed, patient examined, no change in status, stable for surgery.  I have reviewed the patient's chart and labs.  Questions were answered to the patient's satisfaction.    Cardiac: RRR Lungs: CTA bilaterally  Laterality: bilateral Procedure: Cystoscopy, right diagnostic ureteroscopy, right retrograde pyelogram, right ureteral stent exchange, cystogram, possible left diagnostic ureteroscopy  Informed consent obtained, we specifically discussed the risks of bleeding, infection, post-operative pain, need for additional procedures. Operative site appropriately marked where indicated  Stephen JONELLE Skye, MD 08/24/2024   Stephen Bennett

## 2024-08-24 NOTE — Discharge Instructions (Addendum)
 DISCHARGE INSTRUCTIONS FOR URETERAL STENT   MEDICATIONS:  1.  Resume all your other meds from home - except do not take any extra narcotic pain meds that you may have at home.   ACTIVITY:  1. No strenuous activity x 1week  2. No driving while on narcotic pain medications  3. Drink plenty of water  4. Continue to walk at home - you can still get blood clots when you are at home, so keep active, but don't over do it.  5. May return to work/school tomorrow or when you feel ready   BATHING:  1. You can shower and we recommend daily showers   SIGNS/SYMPTOMS TO CALL:  Please call us  if you have a fever greater than 101.5, uncontrolled nausea/vomiting, uncontrolled pain, dizziness, unable to urinate, bloody urine, chest pain, shortness of breath, leg swelling, leg pain, redness around wound, drainage from wound, or any other concerns or questions.   You can reach us  at [phone].   FOLLOW-UP:  1.  Follow-up with Dr. Georganne in the next week 2.  We will need to discuss next steps in management 3.  You currently have 2 right-sided ureteral stents in place.  Ongoing blood in the urine will be expected, please stay very hydrated.  Mild to moderate bleeding may occur intermittently until your next operation

## 2024-08-25 ENCOUNTER — Telehealth: Payer: Self-pay

## 2024-08-25 ENCOUNTER — Ambulatory Visit (INDEPENDENT_AMBULATORY_CARE_PROVIDER_SITE_OTHER): Admitting: Urology

## 2024-08-25 ENCOUNTER — Encounter: Payer: Self-pay | Admitting: Urology

## 2024-08-25 ENCOUNTER — Ambulatory Visit: Admitting: Urology

## 2024-08-25 VITALS — BP 125/81 | HR 104 | Ht 67.0 in | Wt 120.0 lb

## 2024-08-25 DIAGNOSIS — R310 Gross hematuria: Secondary | ICD-10-CM

## 2024-08-25 DIAGNOSIS — C641 Malignant neoplasm of right kidney, except renal pelvis: Secondary | ICD-10-CM

## 2024-08-25 DIAGNOSIS — R339 Retention of urine, unspecified: Secondary | ICD-10-CM

## 2024-08-25 LAB — BLADDER SCAN AMB NON-IMAGING

## 2024-08-25 NOTE — Progress Notes (Deleted)
 09/02/2024 3:47 PM   [name] Dietrich Mickey. September 19, 1941 [id number]  Reason for visit: Follow up Right distal HGT1 UTUC   HPI: 83 y.o. male, follow up with me today S/p Right URS + stent placement on 08/24/24   - significant Right distal ureteral tumor burden, w/ extension above pelvic brim   - retained ureteral stent, now has x2 Right ureteral stents in place   Prior HPI:   Hx of GH, CT scan with severe Right hydroureteronephrosis to UVJ, Left hydronephrosis, intravesical clot burden S/p cysto w/ clot evac (05/31/24), Right RPG, Right URS w/ biopsy, Right stent placement   - filling defect Right distal ureter, visible papillary tumor to distal ureter   - path = indeterminate, limited tissue S/p right URS with distal ureteral biopsy (07/22/24) + Right stent exchange   - HGT1 UTUC    - proximal ureter and renal pelvis not visualized   No prior abdominal surgery Subjectively reports excellent health, never saw a doctor prior to recent events Current lifelong smoker History of chronic lower extremity edema, on Lasix  -unclear etiology, patient is in between PCP providers, awaiting appointment Denies history of cardiac events, stroke, MI, unclear if evaluated for COPD       Physical Exam: There were no vitals taken for this visit.   Constitutional:  Alert and oriented, No acute distress.  Laboratory Data: Baseline creatinine -likely 0.8 (he briefly returned to this value 06/02/2024 shortly after initial right ureteral stent placement, although I think he became reobstructed when stent malpositioned).  I anticipate him much better total GFR with stent decompression.   Pertinent Imaging: I have personally viewed and interpreted the CTU (08/21/24) -enhancing right distal ureteral mass consistent with known high-grade urothelial carcinoma.  Right ureteral stent in place, distal tip within distal ureter.  Persistent moderate to severe right hydroureteronephrosis.  Normal-appearing  contralateral left kidney, no left-sided filling defects, no left hydroureteronephrosis.    Assessment & Plan:    Urothelial carcinoma of kidney, right [facility]) Assessment & Plan: HGT1 Right distal UTUC (dx 07/22/24)   - tumor extension above pelvic brim, unresectable from endoscopic approach  - CTU (08/21/24) - normal Left kidney, no regional LNA or metastatic disease   - x2 indwelling Right ureteral stents (one is malpositioned, proximal to tumor burden)   I had a thoughtful conversation with him again today, and his good friend and neighbor who assists with his medical care.  Unfortunately, I do not think he would be an ideal candidate for a segmental ureterectomy, considering his high-grade disease and extent of tumor burden above the pelvic brim.  Segmental ureterectomy may offer substandard oncologic control as well as a more challenging ureteral reimplant /reconstruction.  Fortunately, I do think he has a healthy contralateral left kidney with a creatinine of 0.8 at baseline (which he briefly returned to after last ureteral stent decompression).  For oncologic management, I would recommend a right robotic radical nephro ureterectomy + regional lymph node dissection.  I explained the basic tenets of this procedure, expected perioperative pathway, outcomes, recovery and possible complications.  I reviewed the risk of long-term CKD, renal and cardiovascular health.  Pending final pathology and recovered renal function, I also reviewed the role of adjuvant chemotherapy.  I would expect a 3-4-hour operation, 1-2 night hospital stay, and a 5-7-day indwelling catheter time.  We would plan to instill intravesical gemcitabine for bladder cancer prevention intraoperatively. All questions answered today, and patient was amenable to treatment plan.   - schedule  in OR: Right robotic radical nephroureterectomy, retroperitoneal lymph node dissection, intravesical Gemcitabine  - Needs to see PCP for medical  optimization, BLE edema / fluid management, cardiac evaluation if indicated - Needs full anesthesia evaluation  - Pre-op lab work: CT chest (to complete oncologic staging), CBC, BMP, coag panel, type and screen, UA/Ucx          [name], MD  [facility] [address] [place], [place] [address] [phone]

## 2024-08-25 NOTE — Assessment & Plan Note (Deleted)
 HGT1 Right distal UTUC (dx 07/22/24)   - tumor extension above pelvic brim, unresectable from endoscopic approach  - CTU (08/21/24) - normal Left kidney, no regional LNA or metastatic disease   - x2 indwelling Right ureteral stents (one is malpositioned, proximal to tumor burden)   I had a thoughtful conversation with him again today, and his good friend and neighbor who assists with his medical care.  Unfortunately, I do not think he would be an ideal candidate for a segmental ureterectomy, considering his high-grade disease and extent of tumor burden above the pelvic brim.  Segmental ureterectomy may offer substandard oncologic control as well as a more challenging ureteral reimplant /reconstruction.  Fortunately, I do think he has a healthy contralateral left kidney with a creatinine of 0.8 at baseline (which he briefly returned to after last ureteral stent decompression).  For oncologic management, I would recommend a right robotic radical nephro ureterectomy + regional lymph node dissection.  I explained the basic tenets of this procedure, expected perioperative pathway, outcomes, recovery and possible complications.  I reviewed the risk of long-term CKD, renal and cardiovascular health.  Pending final pathology and recovered renal function, I also reviewed the role of adjuvant chemotherapy.  I would expect a 3-4-hour operation, 1-2 night hospital stay, and a 5-7-day indwelling catheter time.  We would plan to instill intravesical gemcitabine for bladder cancer prevention intraoperatively. All questions answered today, and patient was amenable to treatment plan.   - schedule in OR: Right robotic radical nephroureterectomy, retroperitoneal lymph node dissection, intravesical Gemcitabine  - Needs to see PCP for medical optimization, BLE edema / fluid management, cardiac evaluation if indicated - Needs full anesthesia evaluation  - Pre-op lab work: CT chest (to complete oncologic staging), CBC, BMP, coag  panel, type and screen, UA/Ucx

## 2024-08-25 NOTE — Progress Notes (Unsigned)
 08/25/2024 7:39 AM   Stephen Bennett. 1941-06-28 [id number]  Referring provider: No referring provider defined for this encounter.  Urological history: 1.  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 Georganne   Chief Complaint  Patient presents with   Urinary Retention   HPI: Stephen Berrocal. is a 83 y.o. man who presents today for inability to void with his friend, Stephen Bennett.    Previous records reviewed.  He underwent cystoscopy with right diagnostic ureteroscopy, right retrograde polygraph he had right ureteral stent placement yesterday with Dr. Georganne.  Intraoperative findings noted significant right distal ureteral tumor burden which was fully obstructive and tumor burden extending past the pelvic brim.  He has an appointment next week to discuss right robotic radical nephro ureterectomy.  He states late afternoon or early evening he was unable to void.  He thinks he took 2-3 of the Sanctura 's that were prescribed for him in an effort to see if it would help him urinate.  He states he was able to void when he got home from the hospital and the urine was clear.   PVR 618 cc  PMH: Past Medical History:  Diagnosis Date   Actinic keratosis 03/27/2023   left pretibial, LN2 04/15/23   Basal cell carcinoma 06/11/2018   L zygoma - excision 10/13/2018   BCC (basal cell carcinoma of skin) 06/11/2018   L cheek 5.0 cm ant to the earlobe   Chronic low back pain    Chronic narcotic dependence (HCC)    Cigarette smoker    Compression fracture of fourth lumbar vertebra (HCC)    Emphysema lung (HCC)    Leg edema, right    Microcytic anemia    Myelodysplasia (myelodysplastic syndrome) (HCC)    Peripheral venous insufficiency    Right nephrolithiasis    SCC (squamous cell carcinoma) 09/18/2023   left lateral leg ED&C done 09/18/23   SCC (squamous cell carcinoma) 04/28/2024   left elbow - treated with ED&C   Squamous cell carcinoma  of skin 04/02/2018   R distal lat calf - ED&C   Squamous cell carcinoma of skin 01/31/2022   L med bicep - ED&C   Squamous cell carcinoma of skin 06/12/2022   R temple - ED&C   Squamous cell carcinoma of skin 07/03/2023   left medial calf, EDC   Squamous cell carcinoma of skin 07/03/2023   right medial calf, EDC   Ureteral tumor     Surgical History: Past Surgical History:  Procedure Laterality Date   CYSTOSCOPY W/ RETROGRADES Bilateral 08/24/2024   Procedure: CYSTOSCOPY, WITH RETROGRADE PYELOGRAM;  Surgeon: Georganne Penne SAUNDERS, MD;  Location: [facility];  Service: Urology;  Laterality: Bilateral;   CYSTOSCOPY W/ URETERAL STENT PLACEMENT Right 07/22/2024   Procedure: CYSTOSCOPY, WITH RETROGRADE PYELOGRAM AND URETERAL STENT INSERTION;  Surgeon: Twylla Glendia BROCKS, MD;  Location: [facility];  Service: Urology;  Laterality: Right;   CYSTOSCOPY WITH BIOPSY Right 05/31/2024   Procedure: CYSTOSCOPY, WITH BIOPSY;  Surgeon: Twylla Glendia BROCKS, MD;  Location: [facility];  Service: Urology;  Laterality: Right;   CYSTOSCOPY WITH BIOPSY Right 07/22/2024   Procedure: CYSTOSCOPY, WITH BIOPSY;  Surgeon: Twylla Glendia BROCKS, MD;  Location: [facility];  Service: Urology;  Laterality: Right;   CYSTOSCOPY WITH FULGERATION N/A 05/31/2024   Procedure: CYSTOSCOPY, WITH URETERAL FULGURATION;  Surgeon: Twylla Glendia BROCKS, MD;  Location: [facility];  Service: Urology;  Laterality: N/A;  possible bladder biopsy and  possible tumor resection   CYSTOSCOPY WITH STENT PLACEMENT Right 05/31/2024   Procedure: CYSTOSCOPY, WITH STENT INSERTION;  Surgeon: Twylla Glendia BROCKS, MD;  Location: [facility];  Service: Urology;  Laterality: Right;   CYSTOSCOPY WITH STENT PLACEMENT Right 08/24/2024   Procedure: CYSTOSCOPY, WITH STENT INSERTION;  Surgeon: Georganne Penne SAUNDERS, MD;  Location: [facility];  Service: Urology;  Laterality: Right;   CYSTOSCOPY/URETEROSCOPY/HOLMIUM LASER Right 05/31/2024   Procedure: CYSTOURETEROSCOPY, USING HOLMIUM LASER;  Surgeon: Twylla Glendia BROCKS, MD;  Location: [facility];  Service: Urology;  Laterality: Right;   CYSTOSCOPY/URETEROSCOPY/HOLMIUM LASER/STENT PLACEMENT Right 07/22/2024   Procedure: CYSTOSCOPY/URETEROSCOPY/HOLMIUM LASER/STENT PLACEMENT;  Surgeon: Twylla Glendia BROCKS, MD;  Location: [facility];  Service: Urology;  Laterality: Right;   SKIN CANCER EXCISION     several   URETEROSCOPY Bilateral 08/24/2024   Procedure: URETEROSCOPY;  Surgeon: Georganne Penne SAUNDERS, MD;  Location: [facility];  Service: Urology;  Laterality: Bilateral;    Home Medications:  Allergies as of 08/25/2024   No Known Allergies      Medication List        Accurate as of August 25, 2024 11:59 PM. If you have any questions, ask your nurse or doctor.          CALCIUM ASPARTATE PO Take 1 capsule by mouth daily with supper. Ezorb Calcium Aspartate Anhydrous for Bone and Joint Health   calcium carbonate 500 MG chewable tablet Commonly known as: TUMS - dosed in mg elemental calcium Chew 1 tablet by mouth daily with supper.   cyanocobalamin  1000 MCG tablet Commonly known as: VITAMIN B12 Take 1,000 mcg by mouth daily with supper.   furosemide  20 MG tablet Commonly known as: LASIX  Take 1-2 tablets (20-40 mg total) by mouth daily as needed. For increased morning leg swelling What changed: when to take this   traMADol  50 MG tablet Commonly known as: ULTRAM  Take 1 tablet (50 mg total) by mouth daily after breakfast.   trospium  20 MG tablet Commonly known as: SANCTURA  Take 1 tablet (20 mg total) by mouth 2 (two) times daily as needed (frequency,urgency,bladder spasm).   VITAMIN B-3 PO Take 1 tablet by mouth daily with supper.        Allergies: No Known Allergies  Family History: Family History  Problem Relation Age of Onset   Cancer Mother    Heart disease Neg Hx    Diabetes Neg Hx     Social History:  reports that he has been smoking cigarettes. He has a 31.5 pack-year smoking history. He has been exposed to tobacco smoke. He  has never used smokeless tobacco. He reports that he does not currently use alcohol. He reports that he does not currently use drugs.  ROS: Pertinent ROS in HPI  Physical Exam: BP 125/81   Pulse (!) 104   Ht 5' 7 (1.702 m)   Wt 120 lb (54.4 kg)   SpO2 96%   BMI 18.79 kg/m   Constitutional:  Well nourished. Alert and oriented, No acute distress. HEENT: [place] AT, moist mucus membranes.  Trachea midline, no masses. Cardiovascular: No clubbing, cyanosis, or edema. Respiratory: Normal respiratory effort, no increased work of breathing. GU: No CVA tenderness.  Bladder is palpable in the suprapubic area.  Patient with circumcised phallus.  Urethral meatus is patent.  No penile discharge. No penile lesions or rashes. Neurologic: Grossly intact, no focal deficits, moving all 4 extremities. Psychiatric: Normal mood and affect.  Laboratory Data: See Epic and HPI   I have reviewed the  labs.   Pertinent Imaging:  08/25/24 12:59  Scan Result   Simple Catheter Placement Due to urinary retention patient is present today for a foley cath placement.  Patient was cleaned and prepped in a sterile fashion with betadine and 2% lidocaine  jelly was instilled into the urethra. A 18 FR Coude foley catheter was inserted, urine return was noted  600 ml, urine was dark red in color.  The balloon was filled with 10cc of sterile water.  I irrigated with 500 cc of sterile water to receive 20 cc of small old clot.  Efflux is red clear.  A leg bag was attached for drainage. Patient was also given a night bag to take home and was given instruction on how to change from one bag to another.  Patient was given instruction on proper catheter care.  Patient tolerated well, no complications were noted   Performed by: Stephen CORNWALL, PA-C and Stephen Bennett, CMA     Assessment & Plan:    1. Urinary retention (Primary) - Bladder Scan (Post Void Residual) in office demonstrated an adequate bladder emptying - 18  French coud catheter in place -Advised him not to take any more of the trospium  as this will cause his bladder not to function - Patient will have a voiding trial at the time of his appointment with Dr. Georganne, he will come in that morning in a.m. to discontinue the Foley and then he will return in that afternoon with Dr. 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  2. Urothelial carcinoma of the right kidney and ureter  - Has appointment next week with Dr.Garren  3.  Gross hematuria - Secondary to large tumor burden - Advised Mr. Melendrez to continue to push fluids to keep urine dilute and avoid clot formation, advised him to abstain for any blood thinning medications (NSAIDS, ASA), ED triggers discussed such as the catheter stops draining, passage of large clots, passage of thick red urine into the catheter bag, fevers or severe pain   Return for Keep appointment next week for Foley removal and w/ Dr. Georganne .  These notes generated with voice recognition software. I apologize for typographical errors.  Stephen Bennett  [facility] [address]  [address] [place], [place] [address] [phone]

## 2024-08-25 NOTE — Telephone Encounter (Signed)
 Patient called in this morning reports that he has been unable to urinate since yesterday after surgery. States that he has submerged penis in a cup of warm water to help and has used a q-tip to try to clear the jelly like solififed urine out that he thinks may be in there without relief. Advised that these are not solutions that we recommend. Spoke with Clotilda Cornwall who advised for the patient to come on in, Patient without a ride, awaiting ride. Advised he thinks he will be here by 1130am.

## 2024-08-25 NOTE — Patient Instructions (Signed)
 Do not take any more Trospium  (Sanctura ).  This stops up your bladder.  Drink a lot of fluids to keep your urine flowing.  You will likely have blood in your urine from time to time.  If you find that the urine continues to be bloody or the catheter stops draining, please call the office or seek treatment in the ED.

## 2024-08-30 ENCOUNTER — Telehealth: Payer: Self-pay

## 2024-08-30 NOTE — Telephone Encounter (Signed)
 2nd attempt to reach out to patient to make sure he picked up his (Bactrim) we sent in a antibiotic after reviewing his urine culture results. He is to take 1 tablet every 12 hours for 5 days. Dr. Georganne wanted him to take the antibiotic before his surgery date 10/28. Last follow up was with Clotilda PA on 10/29. Left our office number in case he had any questions or concerns.-Tyjai Charbonnet,CMA

## 2024-09-02 ENCOUNTER — Ambulatory Visit: Admitting: Urology

## 2024-09-02 ENCOUNTER — Ambulatory Visit (INDEPENDENT_AMBULATORY_CARE_PROVIDER_SITE_OTHER): Admitting: Urology

## 2024-09-02 VITALS — BP 120/64 | HR 82

## 2024-09-02 DIAGNOSIS — C641 Malignant neoplasm of right kidney, except renal pelvis: Secondary | ICD-10-CM

## 2024-09-02 NOTE — Patient Instructions (Signed)
Please call [phone] to schedule your CT scan.

## 2024-09-02 NOTE — Progress Notes (Signed)
 09/02/2024 2:19 PM   Stephen Bennett. 1941/05/18 [id number]  Reason for visit: Follow up Right distal HGT1 UTUC  HPI: 83 y.o. male, follow up with me today S/p Right URS + stent placement on 08/24/24   - significant Right distal ureteral tumor burden, w/ extension above pelvic brim   - retained ureteral stent, now has x2 Right ureteral stents in place   Accompanied by his friend again today He pulled his prior catheter out at home -does not seem like he deflated balloon although seems to be doing well no gross hematuria  Prior HPI: Hx of GH, CT scan with severe Right hydroureteronephrosis to UVJ, Left hydronephrosis, intravesical clot burden S/p cysto w/ clot evac (05/31/24), Right RPG, Right URS w/ biopsy, Right stent placement   - filling defect Right distal ureter, visible papillary tumor to distal ureter   - path = indeterminate, limited tissue S/p right URS with distal ureteral biopsy (07/22/24) + Right stent exchange   - HGT1 UTUC    - proximal ureter and renal pelvis not visualized   No prior abdominal surgery Subjectively reports excellent health, never saw a doctor prior to recent events Current lifelong smoker History of chronic lower extremity edema, on Lasix  -unclear etiology, patient is in between PCP providers, awaiting appointment Denies history of cardiac events, stroke, MI, unclear if evaluated for COPD    Physical Exam: BP 120/64   Pulse 82    Constitutional:  Alert and oriented, No acute distress. CV: RRR Pulm: nonlabored breathing, regular rate Abd:  Laboratory Data: Baseline creatinine -likely 0.8 (he briefly returned to this value 06/02/2024 shortly after initial right ureteral stent placement, although I think he became reobstructed when stent malpositioned).  I anticipate him much better total GFR with stent decompression.   Pertinent Imaging: I have personally viewed and interpreted the CTU (08/21/24) -enhancing right distal ureteral mass  consistent with known high-grade urothelial carcinoma.  Right ureteral stent in place, distal tip within distal ureter.  Persistent moderate to severe right hydroureteronephrosis.  Normal-appearing contralateral left kidney, no left-sided filling defects, no left hydroureteronephrosis.      Assessment & Plan:    Urothelial carcinoma of kidney, right [facility]) Assessment & Plan: HGT1 Right distal UTUC (dx 07/22/24)   - tumor extension above pelvic brim, unresectable from endoscopic approach  - CTU (08/21/24) - normal Left kidney, no regional LNA or metastatic disease   - x2 indwelling Right ureteral stents (one is malpositioned, proximal to tumor burden)   I had a thoughtful conversation with him again today, and his good friend and neighbor who assists with his medical care.  Unfortunately, I do not think he would be an ideal candidate for a segmental ureterectomy, considering his high-grade disease and extent of tumor burden above the pelvic brim.  Segmental ureterectomy may offer substandard oncologic control as well as a more challenging ureteral reimplant /reconstruction.  Fortunately, I do think he has a healthy contralateral left kidney with a creatinine of 0.8 at baseline (which he briefly returned to after last ureteral stent decompression).  For oncologic management, I would recommend a right robotic radical nephro ureterectomy + regional lymph node dissection.  I explained the basic tenets of this procedure, expected perioperative pathway, outcomes, recovery and possible complications.  I reviewed the risk of long-term CKD, renal and cardiovascular health.  Pending final pathology and recovered renal function, I also reviewed the role of adjuvant chemotherapy.  I would expect a 3-4-hour operation, 1-2 night hospital stay,  and a 5-7-day indwelling catheter time.  We would plan to instill intravesical gemcitabine for bladder cancer prevention intraoperatively. All questions answered today, and  patient was amenable to treatment plan.   - schedule in OR: Right robotic radical nephroureterectomy, retroperitoneal lymph node dissection, intravesical Gemcitabine   - likely early/mid Dec 2025 - Needs to see PCP for medical optimization, BLE edema / fluid management, cardiac evaluation if indicated - Needs full anesthesia evaluation  - Pre-op lab work: CT chest (to complete oncologic staging), CBC, BMP, coag panel, type and screen, UA/Ucx        [name], MD  [facility] [address], [address] [place], [place] [address] [phone]

## 2024-09-02 NOTE — Assessment & Plan Note (Addendum)
 HGT1 Right distal UTUC (dx 07/22/24)   - tumor extension above pelvic brim, unresectable from endoscopic approach  - CTU (08/21/24) - normal Left kidney, no regional LNA or metastatic disease   - x2 indwelling Right ureteral stents (one is malpositioned, proximal to tumor burden)   I had a thoughtful conversation with him again today, and his good friend and neighbor who assists with his medical care.  Unfortunately, I do not think he would be an ideal candidate for a segmental ureterectomy, considering his high-grade disease and extent of tumor burden above the pelvic brim.  Segmental ureterectomy may offer substandard oncologic control as well as a more challenging ureteral reimplant /reconstruction.  Fortunately, I do think he has a healthy contralateral left kidney with a creatinine of 0.8 at baseline (which he briefly returned to after last ureteral stent decompression).  For oncologic management, I would recommend a right robotic radical nephro ureterectomy + regional lymph node dissection.  I explained the basic tenets of this procedure, expected perioperative pathway, outcomes, recovery and possible complications.  I reviewed the risk of long-term CKD, renal and cardiovascular health.  Pending final pathology and recovered renal function, I also reviewed the role of adjuvant chemotherapy.  I would expect a 3-4-hour operation, 1-2 night hospital stay, and a 5-7-day indwelling catheter time.  We would plan to instill intravesical gemcitabine for bladder cancer prevention intraoperatively. All questions answered today, and patient was amenable to treatment plan.   - schedule in OR: Right robotic radical nephroureterectomy, retroperitoneal lymph node dissection, intravesical Gemcitabine   - likely early/mid Dec 2025 - Needs to see PCP for medical optimization, BLE edema / fluid management, cardiac evaluation if indicated - Needs full anesthesia evaluation  - Pre-op lab work: CT chest (to complete  oncologic staging), CBC, BMP, coag panel, type and screen, UA/Ucx

## 2024-09-06 ENCOUNTER — Other Ambulatory Visit: Payer: Self-pay

## 2024-09-06 DIAGNOSIS — C661 Malignant neoplasm of right ureter: Secondary | ICD-10-CM

## 2024-09-06 NOTE — Progress Notes (Unsigned)
 Surgical Physician Order Form [facility] [place]  Dr. Georganne, MD  * Scheduling expectation : next available, ?12/16 third case  *Length of Case: 3 hours  *Clearance needed: yes, needs complete PCP evaluation, possible cardiac (he has new PCP on Nov 27th)  *Anticoagulation Instructions: N/A  *Aspirin Instructions: N/A  *Post-op visit Date/Instructions:  1 week cath removal  *Diagnosis: Right ureteral cancer  *Procedure: Right robotic radical nephro-ureterectomy, retroperitoneal lymph node dissection, intravesical Gemcitabine   Additional orders: Gemcitabine 2000mg  bladder instillation  -Admit type: Observation  -Anesthesia: General  -VTE Prophylaxis Standing Order SCD's       Other:   -Standing Lab Orders Per Anesthesia    Lab other: CBC, CMP, coag panel, type and screen, UA/UCx  -Standing Test orders EKG/Chest x-ray per Anesthesia       Test other:   - Medications:  Ancef 2gm IV  -Other orders:  N/A

## 2024-09-09 ENCOUNTER — Telehealth: Payer: Self-pay | Admitting: General Practice

## 2024-09-09 ENCOUNTER — Telehealth: Payer: Self-pay

## 2024-09-09 NOTE — Progress Notes (Signed)
  Phone Number: [phone] for Surgical Coordinator Fax Number: [phone]  REQUEST FOR SURGICAL CLEARANCE      Date: 09/21/2024  Faxed to: Carrol Aurora, NP  Surgeon: Dr. [name], MD     Date of Surgery: 10/12/2024  Operation: Right Robotic Radical Nephroureterectomy with Retroperitonel Lymph Node Dissection and Intravesical Instillation   Anesthesia Type: General   Diagnosis: Right Ureteral Carcinoma  Patient Requires:   Medical Clearance : Yes   Risk Assessment:    Low   []       Moderate   []     High   []           This patient is optimized for surgery  YES []       NO   []    I recommend further assessment/workup prior to surgery. YES []      NO  []   Appointment scheduled for: _______________________   Further recommendations: ____________________________________     Physician Signature:__________________________________   Printed Name: ________________________________________   Date: _________________

## 2024-09-09 NOTE — Telephone Encounter (Signed)
 Medication: traMADol  (ULTRAM ) 50 MG tablet [[id number]] DISCONTINUED

## 2024-09-09 NOTE — Progress Notes (Signed)
   [facility] Surgical Posting Form  Surgery Date: Date: 10/12/2024  Surgeon: Dr. [name], MD  Inpt ( Yes  )   Outpt (No)   Obs ( No  )   Diagnosis: C66.1 Right Ureteral Cancer  -CPT: 49451, 38570, [phone]  Surgery: Right Robotic Radical Nephroureterectomy with Retroperitonel Lymph Node Dissection and Intravesical Instillation   Stop Anticoagulations: Yes  Cardiac/Medical/Pulmonary Clearance needed: Sees a new PCP on 09/20/24.   Clearance needed from Dr: Vincente  Clearance request sent on: Date: 09/21/2024  *Orders entered into EPIC  Date: 09/21/2024   *Case booked in EPIC  Date: 09/09/24  *Notified pt of Surgery: Date: 09/09/24  PRE-OP UA & CX: yes, will also obtain UA/CX, CBC, CMP, PT, PTT, INR, Type and Screen  *Placed into Prior Authorization Work [place] Date: 09/09/24  Assistant/laser/rep:Yes, Dr. Francisca to Assist

## 2024-09-09 NOTE — Telephone Encounter (Signed)
 Per Dr. Georganne, Patient is to be scheduled for Right Robotic Radical Nephroureterectomy with Retroperitonel Lymph Node Dissection and Intravesical Instillation   Mr. Stephen Bennett was contacted and possible surgical dates were discussed, Tuesday December 16th, 2025 was agreed upon for surgery.   Patient was directed to call [phone] between 1-3pm the day before surgery to find out surgical arrival time.  Instructions were given not to eat or drink from midnight on the night before surgery and have a driver for the day of surgery. On the surgery day patient was instructed to enter through the [facility] entrance of [facility] report the Same Day Surgery desk.   Pre-Admit Testing will be in contact via phone to set up an interview with the anesthesia team to review your history and medications prior to surgery.   Reminder of this information was sent via Mail to the patient.

## 2024-09-09 NOTE — Telephone Encounter (Signed)
 Copied from CRM [id number]. Topic: Clinical - Medication Refill >> Sep 09, 2024  1:22 PM Viola F wrote: Medication: traMADol  (ULTRAM ) 50 MG tablet [[id number]]  DISCONTINUED  Has the patient contacted their pharmacy? Yes (Agent: If no, request that the patient contact the pharmacy for the refill. If patient does not wish to contact the pharmacy document the reason why and proceed with request.) (Agent: If yes, when and what did the pharmacy advise?)  This is the patient's preferred pharmacy:  [id number] [place], [place] - [address] [address] Phone: [phone] Fax: [phone]   Is this the correct pharmacy for this prescription? Yes If no, delete pharmacy and type the correct one.   Has the prescription been filled recently? Yes  Is the patient out of the medication? No, 2 left   Has the patient been seen for an appointment in the last year OR does the patient have an upcoming appointment? Yes  Can we respond through MyChart? Yes  Agent: Please be advised that Rx refills may take up to 3 business days. We ask that you follow-up with your pharmacy.

## 2024-09-10 ENCOUNTER — Ambulatory Visit
Admission: RE | Admit: 2024-09-10 | Discharge: 2024-09-10 | Disposition: A | Discharge status: Home / Self Care | Source: Ambulatory Visit | Attending: Urology | Admitting: Urology

## 2024-09-10 DIAGNOSIS — R310 Gross hematuria: Secondary | ICD-10-CM | POA: Insufficient documentation

## 2024-09-10 DIAGNOSIS — C641 Malignant neoplasm of right kidney, except renal pelvis: Secondary | ICD-10-CM | POA: Diagnosis not present

## 2024-09-10 DIAGNOSIS — E049 Nontoxic goiter, unspecified: Secondary | ICD-10-CM | POA: Diagnosis not present

## 2024-09-10 DIAGNOSIS — J432 Centrilobular emphysema: Secondary | ICD-10-CM | POA: Diagnosis not present

## 2024-09-10 LAB — POCT I-STAT CREATININE: Creatinine, Ser: 1.5 mg/dL — ABNORMAL HIGH (ref 0.61–1.24)

## 2024-09-10 MED ORDER — IOHEXOL 300 MG/ML  SOLN
60.0000 mL | Freq: Once | INTRAMUSCULAR | Status: AC | PRN
  Administered 2024-09-10: 60 mL via INTRAVENOUS

## 2024-09-10 NOTE — Telephone Encounter (Signed)
 Patient has been seen by me yet. TOC is scheduled for later this month.

## 2024-09-13 NOTE — Telephone Encounter (Signed)
 Maybe he needs to speak to his urologist about pain medication. I will call him.

## 2024-09-13 NOTE — Telephone Encounter (Signed)
 Copied from CRM #[id number]. Topic: Clinical - Lab/Test Results >> Sep 13, 2024  3:16 PM Sasha M wrote: Reason for CRM: Pt called in to find out about his CT results, please give pt a call to discuss. Also pt expressed understanding of needing to been seen before his is given any medication but he states he is in a lot of pain in his back and was wondering if there is any accommodations that can be made before his appt on next Monday.

## 2024-09-13 NOTE — Telephone Encounter (Signed)
 Spoke to pt. He will ask his urologist about the tramadol . Advised him we have not received the results.

## 2024-09-15 ENCOUNTER — Telehealth: Payer: Self-pay

## 2024-09-15 NOTE — Telephone Encounter (Signed)
 Said he was only given a 10 day supply of this in September.

## 2024-09-15 NOTE — Telephone Encounter (Signed)
 Patient states that he has been having bladder spasms, leaking urine without control at times. Patient is currently not taking any thing for this. Scheduled for next surgery on 12/16. Is there anything he can take to help with these symptoms. Please Advise.

## 2024-09-16 ENCOUNTER — Telehealth: Payer: Self-pay

## 2024-09-16 NOTE — Telephone Encounter (Signed)
 I tried calling patient no response. I wanted to inform him about his appt we scheduled this Friday Per Dr. Georganne, he could be in possible urinary retention and he removed his catheter at home. -[facility]

## 2024-09-17 ENCOUNTER — Ambulatory Visit: Admitting: Urology

## 2024-09-17 ENCOUNTER — Telehealth: Payer: Self-pay

## 2024-09-17 DIAGNOSIS — R32 Unspecified urinary incontinence: Secondary | ICD-10-CM | POA: Diagnosis not present

## 2024-09-17 DIAGNOSIS — C641 Malignant neoplasm of right kidney, except renal pelvis: Secondary | ICD-10-CM | POA: Diagnosis not present

## 2024-09-17 NOTE — Telephone Encounter (Signed)
 Pt called to cancel appt but had questions about urgency with urination and bleeding. He has double stents in right now and I explained this can casue slight bleeding and urgency as well. Pt understood to call us  back or go to the ER if he has issues with fevers, retention, and excessive bleeding. Pt voiced understanding.

## 2024-09-17 NOTE — Assessment & Plan Note (Signed)
 PVR   Likely OAB + UUI related to chronic bladder dysfunction + stent irritation   - he currently has x2 Right ureteral stents in place

## 2024-09-17 NOTE — Progress Notes (Signed)
 09/17/2024 7:34 AM   Stephen Bennett Dietrich Stephen Bennett. 1940/11/11 [id number]  Reason for visit: Follow up Right distal HGT1 UTUC  HPI: 83 y.o. male, follow up with me today  Called triage line with acute on chronic urinary leakage, urgency History of recent retention- where he self-removed catheter at home  Prior HPI: Hx of GH, CT scan with severe Right hydroureteronephrosis to UVJ, Left hydronephrosis, intravesical clot burden  S/p cysto w/ clot evac (05/31/24), Right RPG, Right URS w/ biopsy, Right stent placement   - filling defect Right distal ureter, visible papillary tumor to distal ureter   - path = indeterminate, limited tissue S/p right URS with distal ureteral biopsy (07/22/24) + Right stent exchange   - HGT1 UTUC    - proximal ureter and renal pelvis not visualized  S/p Right URS + stent placement on 08/24/24   - significant Right distal ureteral tumor burden, w/ extension above pelvic brim   - retained ureteral stent, now has x2 Right ureteral stents in place    No prior abdominal surgery Subjectively reports excellent health, never saw a doctor prior to recent events Current lifelong smoker History of chronic lower extremity edema, on Lasix  -unclear etiology, patient is in between PCP providers, awaiting appointment Denies history of cardiac events, stroke, MI, unclear if evaluated for COPD    Physical Exam: There were no vitals taken for this visit.   Constitutional:  Alert and oriented, No acute distress. CV: RRR Pulm: nonlabored breathing, regular rate Abd:  Laboratory Data: Baseline creatinine -likely 0.8 (he briefly returned to this value 06/02/2024 shortly after initial right ureteral stent placement, although I think he became reobstructed when stent malpositioned).  I anticipate him much better total GFR with stent decompression.   Pertinent Imaging: I have personally viewed and interpreted the CTU (08/21/24) -enhancing right distal ureteral mass consistent with  known high-grade urothelial carcinoma.  Right ureteral stent in place, distal tip within distal ureter.  Persistent moderate to severe right hydroureteronephrosis.  Normal-appearing contralateral left kidney, no left-sided filling defects, no left hydroureteronephrosis.   CT Chest (09/14/24) - NED    Assessment & Plan:    Urothelial carcinoma of kidney, right (HCC) Assessment & Plan: HGT1 Right distal UTUC (dx 07/22/24)   - tumor extension above pelvic brim, unresectable from endoscopic approach  - CTU (08/21/24) - normal Left kidney, no regional LNA or metastatic disease   - x2 indwelling Right ureteral stents (one is malpositioned, proximal to tumor burden)   I had a thoughtful conversation with him again today, and his good friend and neighbor who assists with his medical care.  Unfortunately, I do not think he would be an ideal candidate for a segmental ureterectomy, considering his high-grade disease and extent of tumor burden above the pelvic brim.  Segmental ureterectomy may offer substandard oncologic control as well as a more challenging ureteral reimplant /reconstruction.  Fortunately, I do think he has a healthy contralateral left kidney with a creatinine of 0.8 at baseline (which he briefly returned to after last ureteral stent decompression).  For oncologic management, I would recommend a right robotic radical nephro ureterectomy + regional lymph node dissection.  I explained the basic tenets of this procedure, expected perioperative pathway, outcomes, recovery and possible complications.  I reviewed the risk of long-term CKD, renal and cardiovascular health.  Pending final pathology and recovered renal function, I also reviewed the role of adjuvant chemotherapy.  I would expect a 3-4-hour operation, 1-2 night hospital stay, and a  5-7-day indwelling catheter time.  We would plan to instill intravesical gemcitabine for bladder cancer prevention intraoperatively. All questions answered today,  and patient was amenable to treatment plan.   - scheduled in OR for 10/12/24: Right robotic radical nephroureterectomy, retroperitoneal lymph node dissection, intravesical Gemcitabine  - Needs to see PCP for medical optimization, BLE edema / fluid management, cardiac evaluation if indicated - Needs full anesthesia evaluation  - Pre-op lab work: CBC, BMP, coag panel, type and screen, UA/Ucx   Urinary incontinence, unspecified type Assessment & Plan: PVR   Likely OAB + UUI related to chronic bladder dysfunction + stent irritation   - he currently has x2 Right ureteral stents in place         [name], MD  [facility] [address], [address] [place], [place] [address] [phone]

## 2024-09-17 NOTE — Assessment & Plan Note (Signed)
 HGT1 Right distal UTUC (dx 07/22/24)   - tumor extension above pelvic brim, unresectable from endoscopic approach  - CTU (08/21/24) - normal Left kidney, no regional LNA or metastatic disease   - x2 indwelling Right ureteral stents (one is malpositioned, proximal to tumor burden)   I had a thoughtful conversation with him again today, and his good friend and neighbor who assists with his medical care.  Unfortunately, I do not think he would be an ideal candidate for a segmental ureterectomy, considering his high-grade disease and extent of tumor burden above the pelvic brim.  Segmental ureterectomy may offer substandard oncologic control as well as a more challenging ureteral reimplant /reconstruction.  Fortunately, I do think he has a healthy contralateral left kidney with a creatinine of 0.8 at baseline (which he briefly returned to after last ureteral stent decompression).  For oncologic management, I would recommend a right robotic radical nephro ureterectomy + regional lymph node dissection.  I explained the basic tenets of this procedure, expected perioperative pathway, outcomes, recovery and possible complications.  I reviewed the risk of long-term CKD, renal and cardiovascular health.  Pending final pathology and recovered renal function, I also reviewed the role of adjuvant chemotherapy.  I would expect a 3-4-hour operation, 1-2 night hospital stay, and a 5-7-day indwelling catheter time.  We would plan to instill intravesical gemcitabine for bladder cancer prevention intraoperatively. All questions answered today, and patient was amenable to treatment plan.   - scheduled in OR for 10/12/24: Right robotic radical nephroureterectomy, retroperitoneal lymph node dissection, intravesical Gemcitabine  - Needs to see PCP for medical optimization, BLE edema / fluid management, cardiac evaluation if indicated - Needs full anesthesia evaluation  - Pre-op lab work: CBC, BMP, coag panel, type and screen,  UA/Ucx

## 2024-09-17 NOTE — H&P (View-Only) (Signed)
 09/17/2024 7:34 AM   Stephen Bennett. 1940/11/11 [id number]  Reason for visit: Follow up Right distal HGT1 UTUC  HPI: 83 y.o. male, follow up with me today  Called triage line with acute on chronic urinary leakage, urgency History of recent retention- where he self-removed catheter at home  Prior HPI: Hx of GH, CT scan with severe Right hydroureteronephrosis to UVJ, Left hydronephrosis, intravesical clot burden  S/p cysto w/ clot evac (05/31/24), Right RPG, Right URS w/ biopsy, Right stent placement   - filling defect Right distal ureter, visible papillary tumor to distal ureter   - path = indeterminate, limited tissue S/p right URS with distal ureteral biopsy (07/22/24) + Right stent exchange   - HGT1 UTUC    - proximal ureter and renal pelvis not visualized  S/p Right URS + stent placement on 08/24/24   - significant Right distal ureteral tumor burden, w/ extension above pelvic brim   - retained ureteral stent, now has x2 Right ureteral stents in place    No prior abdominal surgery Subjectively reports excellent health, never saw a doctor prior to recent events Current lifelong smoker History of chronic lower extremity edema, on Lasix  -unclear etiology, patient is in between PCP providers, awaiting appointment Denies history of cardiac events, stroke, MI, unclear if evaluated for COPD    Physical Exam: There were no vitals taken for this visit.   Constitutional:  Alert and oriented, No acute distress. CV: RRR Pulm: nonlabored breathing, regular rate Abd:  Laboratory Data: Baseline creatinine -likely 0.8 (he briefly returned to this value 06/02/2024 shortly after initial right ureteral stent placement, although I think he became reobstructed when stent malpositioned).  I anticipate him much better total GFR with stent decompression.   Pertinent Imaging: I have personally viewed and interpreted the CTU (08/21/24) -enhancing right distal ureteral mass consistent with  known high-grade urothelial carcinoma.  Right ureteral stent in place, distal tip within distal ureter.  Persistent moderate to severe right hydroureteronephrosis.  Normal-appearing contralateral left kidney, no left-sided filling defects, no left hydroureteronephrosis.   CT Chest (09/14/24) - NED    Assessment & Plan:    Urothelial carcinoma of kidney, right (HCC) Assessment & Plan: HGT1 Right distal UTUC (dx 07/22/24)   - tumor extension above pelvic brim, unresectable from endoscopic approach  - CTU (08/21/24) - normal Left kidney, no regional LNA or metastatic disease   - x2 indwelling Right ureteral stents (one is malpositioned, proximal to tumor burden)   I had a thoughtful conversation with him again today, and his good friend and neighbor who assists with his medical care.  Unfortunately, I do not think he would be an ideal candidate for a segmental ureterectomy, considering his high-grade disease and extent of tumor burden above the pelvic brim.  Segmental ureterectomy may offer substandard oncologic control as well as a more challenging ureteral reimplant /reconstruction.  Fortunately, I do think he has a healthy contralateral left kidney with a creatinine of 0.8 at baseline (which he briefly returned to after last ureteral stent decompression).  For oncologic management, I would recommend a right robotic radical nephro ureterectomy + regional lymph node dissection.  I explained the basic tenets of this procedure, expected perioperative pathway, outcomes, recovery and possible complications.  I reviewed the risk of long-term CKD, renal and cardiovascular health.  Pending final pathology and recovered renal function, I also reviewed the role of adjuvant chemotherapy.  I would expect a 3-4-hour operation, 1-2 night hospital stay, and a  5-7-day indwelling catheter time.  We would plan to instill intravesical gemcitabine for bladder cancer prevention intraoperatively. All questions answered today,  and patient was amenable to treatment plan.   - scheduled in OR for 10/12/24: Right robotic radical nephroureterectomy, retroperitoneal lymph node dissection, intravesical Gemcitabine  - Needs to see PCP for medical optimization, BLE edema / fluid management, cardiac evaluation if indicated - Needs full anesthesia evaluation  - Pre-op lab work: CBC, BMP, coag panel, type and screen, UA/Ucx   Urinary incontinence, unspecified type Assessment & Plan: PVR   Likely OAB + UUI related to chronic bladder dysfunction + stent irritation   - he currently has x2 Right ureteral stents in place         [name], MD  [facility] [address], [address] [place], [place] [address] [phone]

## 2024-09-20 ENCOUNTER — Ambulatory Visit (INDEPENDENT_AMBULATORY_CARE_PROVIDER_SITE_OTHER): Admitting: General Practice

## 2024-09-20 ENCOUNTER — Ambulatory Visit: Payer: Self-pay | Admitting: General Practice

## 2024-09-20 ENCOUNTER — Encounter: Payer: Self-pay | Admitting: General Practice

## 2024-09-20 VITALS — BP 120/60 | HR 86 | Temp 99.2°F | Ht 67.0 in | Wt 123.0 lb

## 2024-09-20 DIAGNOSIS — C641 Malignant neoplasm of right kidney, except renal pelvis: Secondary | ICD-10-CM | POA: Diagnosis not present

## 2024-09-20 DIAGNOSIS — J439 Emphysema, unspecified: Secondary | ICD-10-CM | POA: Diagnosis not present

## 2024-09-20 DIAGNOSIS — F1120 Opioid dependence, uncomplicated: Secondary | ICD-10-CM | POA: Diagnosis not present

## 2024-09-20 DIAGNOSIS — Z7689 Persons encountering health services in other specified circumstances: Secondary | ICD-10-CM | POA: Diagnosis not present

## 2024-09-20 DIAGNOSIS — M810 Age-related osteoporosis without current pathological fracture: Secondary | ICD-10-CM

## 2024-09-20 DIAGNOSIS — M545 Low back pain, unspecified: Secondary | ICD-10-CM | POA: Diagnosis not present

## 2024-09-20 DIAGNOSIS — F17200 Nicotine dependence, unspecified, uncomplicated: Secondary | ICD-10-CM

## 2024-09-20 DIAGNOSIS — R7309 Other abnormal glucose: Secondary | ICD-10-CM | POA: Insufficient documentation

## 2024-09-20 DIAGNOSIS — G8929 Other chronic pain: Secondary | ICD-10-CM

## 2024-09-20 LAB — POCT GLYCOSYLATED HEMOGLOBIN (HGB A1C): Hemoglobin A1C: 6 % — AB (ref 4.0–5.6)

## 2024-09-20 MED ORDER — TRAMADOL HCL 50 MG PO TABS: 50.0000 mg | ORAL_TABLET | Freq: Three times a day (TID) | ORAL | 0 refills | Status: AC | PRN

## 2024-09-20 NOTE — Assessment & Plan Note (Signed)
 Smoking cessation instruction/counseling given:  counseled patient on the dangers of tobacco use, advised patient to stop smoking, and reviewed strategies to maximize success

## 2024-09-20 NOTE — Assessment & Plan Note (Signed)
 EMR reviewed briefly.

## 2024-09-20 NOTE — Patient Instructions (Signed)
 Refill sent for ultram .   Follow up in 3 months for physical.   It was a pleasure to see you today!

## 2024-09-20 NOTE — Progress Notes (Signed)
 New Patient Office Visit  Subjective    Patient ID: Stephen Bennett., male    DOB: 1941/07/19  Age: 83 y.o. MRN: [id number]  CC:  Chief Complaint  Patient presents with   New Patient (Initial Visit)    TOC from Dr. Jimmy   Mass    Discuss recent CT scan done with urology; patient has to have surgery 10/12/24.     HPI Stephen Bennett. is a 83 y.o. male presents to establish care.  Previous PCP/physical/labs: Dr. Jimmy  Discussed the use of AI scribe software for clinical note transcription with the patient, who gave verbal consent to proceed.  History of Present Illness Stephen Bennett. is an 83 year old male with ureteral cancer who presents for transfer of care and follow-up on his condition.  He has a history of ureteral cancer, with a high-grade ureteral mass initially identified in January. He was hospitalized in February for hematuria. A CT hematuria workup on August 19, 2024, revealed a 3 cm right ureteral mass. He underwent ureteroscopy and stent placement on August 24, 2024, due to urinary retention. Hematuria persists, intermittently clearing but then returning. Scheduled for surgery on 10/12/24.   He has a history of emphysema and continues to smoke, although he states he does not inhale. No shortness of breath is reported, and he maintains good physical activity, performing 132 push-ups without breaks. A recent CT scan showed emphysema but no nodules.  Chronic back pain is managed with tramadol  once daily, primarily in the morning when pain is most severe. He has previously been prescribed hydrocodone  but prefers tramadol  as it does not cause drowsiness.  He has osteoporosis and takes calcium and vitamin D supplementation.   A history of a goiter was noted in 2021.     Outpatient Encounter Medications as of 09/20/2024  Medication Sig   CALCIUM ASPARTATE PO Take 1 capsule by mouth daily with supper. Ezorb Calcium Aspartate Anhydrous for Bone and Joint  Health   calcium carbonate (TUMS - DOSED IN MG ELEMENTAL CALCIUM) 500 MG chewable tablet Chew 1 tablet by mouth daily with supper.   cyanocobalamin  (VITAMIN B12) 1000 MCG tablet Take 1,000 mcg by mouth daily with supper.   furosemide  (LASIX ) 20 MG tablet Take 1-2 tablets (20-40 mg total) by mouth daily as needed. For increased morning leg swelling (Patient taking differently: Take 20-40 mg by mouth in the morning. For increased morning leg swelling)   Niacin (VITAMIN B-3 PO) Take 1 tablet by mouth daily with supper.   traMADol  (ULTRAM ) 50 MG tablet Take 1 tablet (50 mg total) by mouth 3 (three) times daily as needed.   trospium  (SANCTURA ) 20 MG tablet Take 1 tablet (20 mg total) by mouth 2 (two) times daily as needed (frequency,urgency,bladder spasm).   No facility-administered encounter medications on file as of 09/20/2024.    Past Medical History:  Diagnosis Date   Actinic keratosis 03/27/2023   left pretibial, LN2 04/15/23   Basal cell carcinoma 06/11/2018   L zygoma - excision 10/13/2018   BCC (basal cell carcinoma of skin) 06/11/2018   L cheek 5.0 cm ant to the earlobe   Chronic low back pain    Chronic narcotic dependence (HCC)    Cigarette smoker    Compression fracture of fourth lumbar vertebra (HCC)    Emphysema lung (HCC)    Leg edema, right    Microcytic anemia    Myelodysplasia (myelodysplastic syndrome) (HCC)    Peripheral venous  insufficiency    Right nephrolithiasis    SCC (squamous cell carcinoma) 09/18/2023   left lateral leg ED&C done 09/18/23   SCC (squamous cell carcinoma) 04/28/2024   left elbow - treated with ED&C   Squamous cell carcinoma of skin 04/02/2018   R distal lat calf - ED&C   Squamous cell carcinoma of skin 01/31/2022   L med bicep - ED&C   Squamous cell carcinoma of skin 06/12/2022   R temple - ED&C   Squamous cell carcinoma of skin 07/03/2023   left medial calf, EDC   Squamous cell carcinoma of skin 07/03/2023   right medial calf, EDC    Ureteral tumor     Past Surgical History:  Procedure Laterality Date   CYSTOSCOPY W/ RETROGRADES Bilateral 08/24/2024   Procedure: CYSTOSCOPY, WITH RETROGRADE PYELOGRAM;  Surgeon: Georganne Penne SAUNDERS, MD;  Location: [facility];  Service: Urology;  Laterality: Bilateral;   CYSTOSCOPY W/ URETERAL STENT PLACEMENT Right 07/22/2024   Procedure: CYSTOSCOPY, WITH RETROGRADE PYELOGRAM AND URETERAL STENT INSERTION;  Surgeon: Twylla Glendia BROCKS, MD;  Location: [facility];  Service: Urology;  Laterality: Right;   CYSTOSCOPY WITH BIOPSY Right 05/31/2024   Procedure: CYSTOSCOPY, WITH BIOPSY;  Surgeon: Twylla Glendia BROCKS, MD;  Location: [facility];  Service: Urology;  Laterality: Right;   CYSTOSCOPY WITH BIOPSY Right 07/22/2024   Procedure: CYSTOSCOPY, WITH BIOPSY;  Surgeon: Twylla Glendia BROCKS, MD;  Location: [facility];  Service: Urology;  Laterality: Right;   CYSTOSCOPY WITH FULGERATION N/A 05/31/2024   Procedure: CYSTOSCOPY, WITH URETERAL FULGURATION;  Surgeon: Twylla Glendia BROCKS, MD;  Location: [facility];  Service: Urology;  Laterality: N/A;  possible bladder biopsy and possible tumor resection   CYSTOSCOPY WITH STENT PLACEMENT Right 05/31/2024   Procedure: CYSTOSCOPY, WITH STENT INSERTION;  Surgeon: Twylla Glendia BROCKS, MD;  Location: [facility];  Service: Urology;  Laterality: Right;   CYSTOSCOPY WITH STENT PLACEMENT Right 08/24/2024   Procedure: CYSTOSCOPY, WITH STENT INSERTION;  Surgeon: Georganne Penne SAUNDERS, MD;  Location: [facility];  Service: Urology;  Laterality: Right;   CYSTOSCOPY/URETEROSCOPY/HOLMIUM LASER Right 05/31/2024   Procedure: CYSTOURETEROSCOPY, USING HOLMIUM LASER;  Surgeon: Twylla Glendia BROCKS, MD;  Location: [facility];  Service: Urology;  Laterality: Right;   CYSTOSCOPY/URETEROSCOPY/HOLMIUM LASER/STENT PLACEMENT Right 07/22/2024   Procedure: CYSTOSCOPY/URETEROSCOPY/HOLMIUM LASER/STENT PLACEMENT;  Surgeon: Twylla Glendia BROCKS, MD;  Location: [facility];  Service: Urology;  Laterality: Right;   SKIN CANCER EXCISION     several    URETEROSCOPY Bilateral 08/24/2024   Procedure: URETEROSCOPY;  Surgeon: Georganne Penne SAUNDERS, MD;  Location: [facility];  Service: Urology;  Laterality: Bilateral;    Family History  Problem Relation Age of Onset   Cancer Mother    Heart disease Neg Hx    Diabetes Neg Hx     Social History   Socioeconomic History   Marital status: Divorced    Spouse name: Not on file   Number of children: 0   Years of education: Not on file   Highest education level: Not on file  Occupational History   Occupation: Customer service    Comment: Retired   Occupation: Leisure Centre Manager    Comment: retired  Tobacco Use   Smoking status: Every Day    Current packs/day: 0.50    Average packs/day: 0.5 packs/day for 63.0 years (31.5 ttl pk-yrs)    Types: Cigarettes    Passive exposure: Past   Smokeless tobacco: Never  Vaping Use   Vaping status: Never Used  Substance and Sexual Activity   Alcohol  use: Not Currently    Comment: has not had any alcohol since 2018   Drug use: Not Currently   Sexual activity: Not on file  Other Topics Concern   Not on file  Social History Narrative   No living will      Would want neighbor Jori Combs (and her daughter Nat) to make decisions   He does have cousins in [place]    Would accept resuscitation   May accept tube feeds   Social Drivers of Health   Financial Resource Strain: Low Risk  (07/01/2024)   Overall Financial Resource Strain (CARDIA)    Difficulty of Paying Living Expenses: Not hard at all  Food Insecurity: No Food Insecurity (07/01/2024)   Hunger Vital Sign    Worried About Running Out of Food in the Last Year: Never true    Ran Out of Food in the Last Year: Never true  Transportation Needs: No Transportation Needs (07/01/2024)   PRAPARE - Administrator, Civil Service (Medical): No    Lack of Transportation (Non-Medical): No  Physical Activity: Inactive (07/01/2024)   Exercise Vital Sign    Days of Exercise per Week: 0 days     Minutes of Exercise per Session: 0 min  Stress: Stress Concern Present (07/01/2024)   [employer] of Occupational Health - Occupational Stress Questionnaire    Feeling of Stress: To some extent  Social Connections: Moderately Isolated (07/01/2024)   Social Connection and Isolation Panel    Frequency of Communication with Friends and Family: Three times a week    Frequency of Social Gatherings with Friends and Family: Three times a week    Attends Religious Services: More than 4 times per year    Active Member of Clubs or Organizations: No    Attends Banker Meetings: Never    Marital Status: Divorced  Catering Manager Violence: Not At Risk (07/01/2024)   Humiliation, Afraid, Rape, and Kick questionnaire    Fear of Current or Ex-Partner: No    Emotionally Abused: No    Physically Abused: No    Sexually Abused: No    Review of Systems  Constitutional:  Negative for chills and fever.  Respiratory:  Negative for shortness of breath.   Cardiovascular:  Negative for chest pain.  Gastrointestinal:  Negative for abdominal pain, constipation, diarrhea, heartburn, nausea and vomiting.  Genitourinary:  Negative for dysuria, frequency and urgency.  Neurological:  Negative for dizziness and headaches.  Endo/Heme/Allergies:  Negative for polydipsia.  Psychiatric/Behavioral:  Negative for depression and suicidal ideas. The patient is not nervous/anxious.         Objective    BP 120/60   Pulse 86   Temp 99.2 F (37.3 C) (Temporal)   Ht 5' 7 (1.702 m)   Wt 123 lb (55.8 kg)   SpO2 96%   BMI 19.26 kg/m   Physical Exam Vitals and nursing note reviewed.  Constitutional:      Appearance: Normal appearance.  Cardiovascular:     Rate and Rhythm: Normal rate and regular rhythm.     Pulses: Normal pulses.     Heart sounds: Normal heart sounds.  Pulmonary:     Effort: Pulmonary effort is normal.     Breath sounds: Normal breath sounds.  Neurological:     Mental Status: He  is alert and oriented to person, place, and time.  Psychiatric:        Mood and Affect: Mood normal.  Behavior: Behavior normal.        Thought Content: Thought content normal.        Judgment: Judgment normal.         Assessment & Plan:  Chronic midline low back pain, unspecified whether sciatica present -     traMADol  HCl; Take 1 tablet (50 mg total) by mouth 3 (three) times daily as needed.  Dispense: 60 tablet; Refill: 0  Establishing care with new doctor, encounter for Assessment & Plan: EMR reviewed briefly.     Emphysema of lung (HCC)  Urothelial carcinoma of kidney, right (HCC)  Chronic narcotic dependence (HCC) -     DRUG MONITORING, PANEL 8 WITH CONFIRMATION, URINE  Abnormal glucose Assessment & Plan: A1c of 6.0 today.  Discussed the importance of monitoring diet and exercise.  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 Recent CT showed 3 cm right ureteral mass. Undergoing preoperative management with urology. - Continue preoperative management with urology. - Proceed with scheduled right robotic nephroureterectomy.  Emphysema Chronic emphysema well-controlled. No shortness of breath or nodules on CT scan. - Reviewed CT scan from 08/2024.  Chronic low back pain Managed with tramadol . Concerns about long-term tramadol  use in elderly due to dependency and renal effects. Discussed potential pain management referral post-surgery. - Continue Tramadol  50 mg TID PRN. Rx  sent.  - Contract signed.  - UDS pending.  Goiter Noted on CT scan with slight increase in size since 2021. No palpable goiter today. - Consider referral to endocrinologist.   Osteoporosis Managed with daily calcium and vitamin D supplementation. - Continue daily calcium and vitamin D supplementation.  Return in about 3 months (around 12/21/2024) for physical.   Carrol Aurora, NP

## 2024-09-20 NOTE — Assessment & Plan Note (Addendum)
 A1c of 6.0 today.  Discussed the importance of monitoring diet and exercise.

## 2024-09-21 LAB — DRUG MONITORING, PANEL 8 WITH CONFIRMATION, URINE
6 Acetylmorphine: NEGATIVE ng/mL (ref <10)
Alcohol Metabolites: NEGATIVE ng/mL (ref <500)
Amphetamines: NEGATIVE ng/mL (ref <500)
Benzodiazepines: NEGATIVE ng/mL (ref <100)
Buprenorphine, Urine: NEGATIVE ng/mL (ref <5)
Cocaine Metabolite: NEGATIVE ng/mL (ref <150)
Creatinine: 90.4 mg/dL (ref >=20.0)
MDMA: NEGATIVE ng/mL (ref <500)
Marijuana Metabolite: NEGATIVE ng/mL (ref <20)
Opiates: NEGATIVE ng/mL (ref <100)
Oxidant: NEGATIVE ug/mL (ref <200)
Oxycodone: NEGATIVE ng/mL (ref <100)
pH: 9 (ref 4.5–9.0)

## 2024-09-21 LAB — DM TEMPLATE

## 2024-09-21 LAB — MICROALBUMIN / CREATININE URINE RATIO
Creatinine,U: 114.2 mg/dL
Microalb Creat Ratio: 2325.5 mg/g — ABNORMAL HIGH (ref 0.0–30.0)
Microalb, Ur: 265.7 mg/dL — ABNORMAL HIGH (ref 0.0–1.9)

## 2024-09-21 NOTE — Progress Notes (Signed)
 Printed and faxed back to urology

## 2024-09-22 ENCOUNTER — Ambulatory Visit: Payer: Self-pay

## 2024-09-22 NOTE — Telephone Encounter (Signed)
 FYI Only or Action Required?: Action required by provider: lab or test result follow-up needed. Pt with additional questions about his recent US  results and next steps. Please call pt at [phone]   Patient was last seen in primary care on 09/20/2024 by Vincente Shivers, NP.  Called Nurse Triage reporting Results (/).  Symptoms began No triage.  Interventions attempted: Other: No triage.  Symptoms are: unchanged.  Triage Disposition: Call PCP When Office is Open  Patient/caregiver understands and will follow disposition?:   Reason for Disposition  Caller requesting routine or non-urgent lab result    Office now closed, reopens on Monday  Answer Assessment - Initial Assessment Questions 1. REASON FOR CALL or QUESTION: What is your reason for calling today? or How can I best     Review UA results fro 11/24. Relayed PCP interpretation to pt. Pt with additional questions about his labs and next steps. Office now closed, forwarding message to clinic to call pt back on Monday when they are open. Pt denies change in symptoms (blood in urine) since OV on 11/24. Advised UC or ED for worsening symptoms.   2. CALLER: Document the source of call. (e.g., laboratory staff, caregiver or patient).     Pt  Protocols used: PCP Call - No Triage-A-AH

## 2024-09-27 NOTE — Telephone Encounter (Signed)
 Copied from CRM [id number]. Topic: General - Call Back - No Documentation >> Sep 22, 2024  4:56 PM Rea ORN wrote: Reason for CRM: Pt returned missed call. I advised CMA called to give lab results. Once lab results were read pt was transferred to NT because he had more questions about the results.

## 2024-09-28 ENCOUNTER — Telehealth: Payer: Self-pay | Admitting: General Practice

## 2024-09-28 NOTE — Telephone Encounter (Signed)
 Copied from CRM [id number]. Topic: General - Call Back - No Documentation >> Sep 28, 2024  2:21 PM Mercedes MATSU wrote: Reason for CRM: Patiebt called in stating he had a missed call from [place]. I checked patients chart and did not see any documentation of a outgoing call. Patient states he can be reached at [phone].

## 2024-09-29 NOTE — Telephone Encounter (Signed)
 Called patient back; he's already been given his results last week. Advised patient there was nothing further I needed.

## 2024-09-30 ENCOUNTER — Other Ambulatory Visit: Payer: Self-pay | Admitting: Family

## 2024-10-01 NOTE — Telephone Encounter (Signed)
 You have not refilled for patient yet; okay to refill?

## 2024-10-04 ENCOUNTER — Inpatient Hospital Stay: Admission: RE | Admit: 2024-10-04 | Discharge: 2024-10-04 | Disposition: A | Source: Ambulatory Visit

## 2024-10-04 NOTE — Patient Instructions (Signed)
 Your procedure is scheduled on: Tuesday 10/12/24 Report to the Registration Desk on the 1st floor of the [facility]. To find out your arrival time, please call [phone] between 1PM - 3PM on: Monday 10/11/24 If your arrival time is 6:00 am, do not arrive before that time as the [facility] entrance doors do not open until 6:00 am.  REMEMBER: Instructions that are not followed completely may result in serious medical risk, up to and including death; or upon the discretion of your surgeon and anesthesiologist your surgery may need to be rescheduled.  Do not eat food or drink any liquids after midnight the night before surgery.  No gum chewing or hard candies.  You may however, drink CLEAR liquids up to 2 hours before you are scheduled to arrive for your surgery. Do not drink anything within 2 hours of your scheduled arrival time.  One week prior to surgery: Stop Anti-inflammatories (NSAIDS) such as Advil, Aleve, Ibuprofen, Motrin, Naproxen, Naprosyn and Aspirin based products such as Excedrin, Goody's Powder, BC Powder.  You may however, continue to take Tylenol  if needed for pain up until the day of surgery.  Stop ANY OVER THE COUNTER supplements and vitamins until after surgery.    **Follow guidelines for insulin and diabetes medications.**  **Follow recommendations regarding stopping blood thinners.**  Continue taking all of your other prescription medications up until the day of surgery.  ON THE DAY OF SURGERY ONLY TAKE THESE MEDICATIONS WITH SIPS OF WATER :    Use inhalers on the day of surgery and bring to the hospital.  Fleets enema or bowel prep as directed.  No Alcohol for 24 hours before or after surgery.  No Smoking including e-cigarettes for 24 hours before surgery.  No chewable tobacco products for at least 6 hours before surgery.  No nicotine  patches on the day of surgery.  Do not use any recreational drugs for at least a week (preferably 2 weeks) before  your surgery.  Please be advised that the combination of cocaine and anesthesia may have negative outcomes, up to and including death. If you test positive for cocaine, your surgery will be cancelled.  On the morning of surgery brush your teeth with toothpaste and water , you may rinse your mouth with mouthwash if you wish. Do not swallow any toothpaste or mouthwash.  Use CHG Soap or wipes as directed on instruction sheet.  Do not wear jewelry, make-up, hairpins, clips or nail polish.  For welded (permanent) jewelry: bracelets, anklets, waist bands, etc.  Please have this removed prior to surgery.  If it is not removed, there is a chance that hospital personnel will need to cut it off on the day of surgery.  Do not wear lotions, powders, or perfumes.   Do not shave body hair from the neck down 48 hours before surgery.  Contact lenses, hearing aids and dentures may not be worn into surgery.  Do not bring valuables to the hospital. [facility] is not responsible for any missing/lost belongings or valuables.   Total Shoulder Arthroplasty:  use Benzoyl Peroxide 5% Gel as directed on instruction sheet.  Bring your C-PAP to the hospital in case you may have to spend the night.   Notify your doctor if there is any change in your medical condition (cold, fever, infection).  Wear comfortable clothing (specific to your surgery type) to the hospital.  After surgery, you can help prevent lung complications by doing breathing exercises.  Take deep breaths and cough every 1-2  hours. Your doctor may order a device called an Incentive Spirometer to help you take deep breaths. When coughing or sneezing, hold a pillow firmly against your incision with both hands. This is called "splinting." Doing this helps protect your incision. It also decreases belly discomfort.  If you are being admitted to the hospital overnight, leave your suitcase in the car. After surgery it may be brought to your room.  In  case of increased patient census, it may be necessary for you, the patient, to continue your postoperative care in the Same Day Surgery department.  If you are being discharged the day of surgery, you will not be allowed to drive home. You will need a responsible individual to drive you home and stay with you for 24 hours after surgery.   If you are taking public transportation, you will need to have a responsible individual with you.  Please call the Pre-admissions Testing Dept. at [phone] if you have any questions about these instructions.  Surgery Visitation Policy:  Patients having surgery or a procedure may have two visitors.  Children under the age of 41 must have an adult with them who is not the patient.  Inpatient Visitation:    Visiting hours are 7 a.m. to 8 p.m. Up to four visitors are allowed at one time in a patient room. The visitors may rotate out with other people during the day.  One visitor age 70 or older may stay with the patient overnight and must be in the room by 8 p.m.   Merchandiser, Retail to address health-related social needs:  [email or web address]                                                                                                             Preparing for Surgery with CHLORHEXIDINE  GLUCONATE (CHG) Soap  Chlorhexidine  Gluconate (CHG) Soap  o An antiseptic cleaner that kills germs and bonds with the skin to continue killing germs even after washing  o Used for showering the night before surgery and morning of surgery  Before surgery, you can play an important role by reducing the number of germs on your skin.  CHG (Chlorhexidine  gluconate) soap is an antiseptic cleanser which kills germs and bonds with the skin to continue killing germs even after washing.  Please do not use if you have an allergy to CHG or antibacterial soaps. If your skin becomes reddened/irritated stop using the CHG.  1. Shower the NIGHT  BEFORE SURGERY with CHG soap.  2. If you choose to wash your hair, wash your hair first as usual with your normal shampoo.  3. After shampooing, rinse your hair and body thoroughly to remove the shampoo.  4. Use CHG as you would any other liquid soap. You can apply CHG directly to the skin and wash gently with a clean washcloth.  5. Apply the CHG soap to your body only from the neck down. Do not use on open wounds or open sores. Avoid contact with your eyes, ears, mouth, and genitals (private parts).  Wash face and genitals (private parts) with your normal soap.  6. Wash thoroughly, paying special attention to the area where your surgery will be performed.  7. Thoroughly rinse your body with warm water .  8. Do not shower/wash with your normal soap after using and rinsing off the CHG soap.  9. Do not use lotions, oils, etc., after showering with CHG.  10. Pat yourself dry with a clean towel.  11. Wear clean pajamas to bed the night before surgery.  12. Place clean sheets on your bed the night of your shower and do not sleep with pets.  13. Do not apply any deodorants/lotions/powders.  14. Please wear clean clothes to the hospital.  15. Remember to brush your teeth with your regular toothpaste.  How to Use an Incentive Spirometer  An incentive spirometer is a tool that measures how well you are filling your lungs with each breath. Learning to take long, deep breaths using this tool can help you keep your lungs clear and active. This may help to reverse or lessen your chance of developing breathing (pulmonary) problems, especially infection. You may be asked to use a spirometer: After a surgery. If you have a lung problem or a history of smoking. After a long period of time when you have been unable to move or be active. If the spirometer includes an indicator to show the highest number that you have reached, your health care provider or respiratory therapist will help you set a goal.  Keep a log of your progress as told by your health care provider. What are the risks? Breathing too quickly may cause dizziness or cause you to pass out. Take your time so you do not get dizzy or light-headed. If you are in pain, you may need to take pain medicine before doing incentive spirometry. It is harder to take a deep breath if you are having pain. How to use your incentive spirometer  Sit up on the edge of your bed or on a chair. Hold the incentive spirometer so that it is in an upright position. Before you use the spirometer, breathe out normally. Place the mouthpiece in your mouth. Make sure your lips are closed tightly around it. Breathe in slowly and as deeply as you can through your mouth, causing the piston or the ball to rise toward the top of the chamber. Hold your breath for 3-5 seconds, or for as long as possible. If the spirometer includes a coach indicator, use this to guide you in breathing. Slow down your breathing if the indicator goes above the marked areas. Remove the mouthpiece from your mouth and breathe out normally. The piston or ball will return to the bottom of the chamber. Rest for a few seconds, then repeat the steps 10 or more times. Take your time and take a few normal breaths between deep breaths so that you do not get dizzy or light-headed. Do this every 1-2 hours when you are awake. If the spirometer includes a goal marker to show the highest number you have reached (best effort), use this as a goal to work toward during each repetition. After each set of 10 deep breaths, cough a few times. This will help to make sure that your lungs are clear. If you have an incision on your chest or abdomen from surgery, place a pillow or a rolled-up towel firmly against the incision when you cough. This can help to reduce pain while taking deep breaths and coughing. General tips When  you are able to get out of bed: Walk around often. Continue to take deep breaths and  cough in order to clear your lungs. Keep using the incentive spirometer until your health care provider says it is okay to stop using it. If you have been in the hospital, you may be told to keep using the spirometer at home. Contact a health care provider if: You are having difficulty using the spirometer. You have trouble using the spirometer as often as instructed. Your pain medicine is not giving enough relief for you to use the spirometer as told. You have a fever. Get help right away if: You develop shortness of breath. You develop a cough with bloody mucus from the lungs. You have fluid or blood coming from an incision site after you cough. Summary An incentive spirometer is a tool that can help you learn to take long, deep breaths to keep your lungs clear and active. You may be asked to use a spirometer after a surgery, if you have a lung problem or a history of smoking, or if you have been inactive for a long period of time. Use your incentive spirometer as instructed every 1-2 hours while you are awake. If you have an incision on your chest or abdomen, place a pillow or a rolled-up towel firmly against your incision when you cough. This will help to reduce pain. Get help right away if you have shortness of breath, you cough up bloody mucus, or blood comes from your incision when you cough. This information is not intended to replace advice given to you by your health care provider. Make sure you discuss any questions you have with your health care provider. Document Revised: 01/03/2020 Document Reviewed: 01/03/2020 Elsevier Patient Education  2023 [employer].

## 2024-10-04 NOTE — Pre-Procedure Instructions (Signed)
 Unable to reach pt for pre op interview. Left VM.

## 2024-10-07 ENCOUNTER — Other Ambulatory Visit: Payer: Self-pay

## 2024-10-07 ENCOUNTER — Encounter
Admission: RE | Admit: 2024-10-07 | Discharge: 2024-10-07 | Disposition: A | Discharge status: Home / Self Care | Source: Ambulatory Visit | Attending: Urology | Admitting: Urology

## 2024-10-07 NOTE — Patient Instructions (Addendum)
 Your procedure is scheduled on: 10/12/24 - Tuesday Report to the Registration Desk on the 1st floor of the [facility]. To find out your arrival time, please call [phone] between 1PM - 3PM on: 10/11/24 - Monday If your arrival time is 6:00 am, do not arrive before that time as the [facility] entrance doors do not open until 6:00 am.  REMEMBER: Instructions that are not followed completely may result in serious medical risk, up to and including death; or upon the discretion of your surgeon and anesthesiologist your surgery may need to be rescheduled.  Do not eat food or drink any liquids after midnight the night before surgery.  No gum chewing or hard candies.  One week prior to surgery: Stop Anti-inflammatories (NSAIDS) such as Advil, Aleve, Ibuprofen, Motrin, Naproxen, Naprosyn and Aspirin based products such as Excedrin, Goody's Powder, BC Powder. You may take Tylenol  if needed for pain up until the day of surgery.  Stop ANY OVER THE COUNTER supplements until after surgery.  ON THE DAY OF SURGERY ONLY TAKE THESE MEDICATIONS WITH SIPS OF WATER :  traMADol  (ULTRAM ) if needed   No Alcohol for 24 hours before or after surgery.  No Smoking including e-cigarettes for 24 hours before surgery.  No chewable tobacco products for at least 6 hours before surgery.  No nicotine  patches on the day of surgery.  Do not use any recreational drugs for at least a week (preferably 2 weeks) before your surgery.  Please be advised that the combination of cocaine and anesthesia may have negative outcomes, up to and including death. If you test positive for cocaine, your surgery will be cancelled.  On the morning of surgery brush your teeth with toothpaste and water , you may rinse your mouth with mouthwash if you wish. Do not swallow any toothpaste or mouthwash.  Use CHG Soap or wipes as directed on instruction sheet.  Do not wear jewelry, make-up, hairpins, clips or nail polish.  For  welded (permanent) jewelry: bracelets, anklets, waist bands, etc.  Please have this removed prior to surgery.  If it is not removed, there is a chance that hospital personnel will need to cut it off on the day of surgery.  Do not wear lotions, powders, or perfumes.   Do not shave body hair from the neck down 48 hours before surgery.  Contact lenses, hearing aids and dentures may not be worn into surgery.  Do not bring valuables to the hospital. [facility] is not responsible for any missing/lost belongings or valuables.   Notify your doctor if there is any change in your medical condition (cold, fever, infection).  Wear comfortable clothing (specific to your surgery type) to the hospital.  After surgery, you can help prevent lung complications by doing breathing exercises.  Take deep breaths and cough every 1-2 hours. Your doctor may order a device called an Incentive Spirometer to help you take deep breaths. If you are being admitted to the hospital overnight, leave your suitcase in the car. After surgery it may be brought to your room.  In case of increased patient census, it may be necessary for you, the patient, to continue your postoperative care in the Same Day Surgery department.  If you are being discharged the day of surgery, you will not be allowed to drive home. You will need a responsible individual to drive you home and stay with you for 24 hours after surgery.   If you are taking public transportation, you will need to have  a responsible individual with you.  Please call the Pre-admissions Testing Dept. at [phone] if you have any questions about these instructions.  Surgery Visitation Policy:  Patients having surgery or a procedure may have two visitors.  Children under the age of 75 must have an adult with them who is not the patient.  Inpatient Visitation:    Visiting hours are 7 a.m. to 8 p.m. Up to four visitors are allowed at one time in a patient room.  The visitors may rotate out with other people during the day.  One visitor age 35 or older may stay with the patient overnight and must be in the room by 8 p.m.   Merchandiser, Retail to address health-related social needs:  [email or web address]                                                                                                             Preparing for Surgery with CHLORHEXIDINE  GLUCONATE (CHG) Soap  Chlorhexidine  Gluconate (CHG) Soap  o An antiseptic cleaner that kills germs and bonds with the skin to continue killing germs even after washing  o Used for showering the night before surgery and morning of surgery  Before surgery, you can play an important role by reducing the number of germs on your skin.  CHG (Chlorhexidine  gluconate) soap is an antiseptic cleanser which kills germs and bonds with the skin to continue killing germs even after washing.  Please do not use if you have an allergy to CHG or antibacterial soaps. If your skin becomes reddened/irritated stop using the CHG.  1. Shower the NIGHT BEFORE SURGERY with CHG soap.  2. If you choose to wash your hair, wash your hair first as usual with your normal shampoo.  3. After shampooing, rinse your hair and body thoroughly to remove the shampoo.  4. Use CHG as you would any other liquid soap. You can apply CHG directly to the skin and wash gently with a clean washcloth.  5. Apply the CHG soap to your body only from the neck down. Do not use on open wounds or open sores. Avoid contact with your eyes, ears, mouth, and genitals (private parts). Wash face and genitals (private parts) with your normal soap.  6. Wash thoroughly, paying special attention to the area where your surgery will be performed.  7. Thoroughly rinse your body with warm water .  8. Do not shower/wash with your normal soap after using and rinsing off the CHG soap.  9. Do not use lotions, oils, etc., after showering with  CHG.  10. Pat yourself dry with a clean towel.  11. Wear clean pajamas to bed the night before surgery.  12. Place clean sheets on your bed the night of your shower and do not sleep with pets.  13. Do not apply any deodorants/lotions/powders.  14. Please wear clean clothes to the hospital.  15. Remember to brush your teeth with your regular toothpaste.

## 2024-10-08 ENCOUNTER — Encounter: Payer: Self-pay | Admitting: Urgent Care

## 2024-10-08 ENCOUNTER — Encounter
Admission: RE | Admit: 2024-10-08 | Discharge: 2024-10-08 | Disposition: A | Source: Ambulatory Visit | Attending: Urology

## 2024-10-08 DIAGNOSIS — Z01812 Encounter for preprocedural laboratory examination: Secondary | ICD-10-CM | POA: Insufficient documentation

## 2024-10-08 DIAGNOSIS — C661 Malignant neoplasm of right ureter: Secondary | ICD-10-CM | POA: Insufficient documentation

## 2024-10-08 LAB — COMPREHENSIVE METABOLIC PANEL WITH GFR
ALT: 11 U/L (ref 0–44)
AST: 16 U/L (ref 15–41)
Albumin: 3.7 g/dL (ref 3.5–5.0)
Alkaline Phosphatase: 70 U/L (ref 38–126)
Anion gap: 8 (ref 5–15)
BUN: 22 mg/dL (ref 8–23)
CO2: 29 mmol/L (ref 22–32)
Calcium: 9 mg/dL (ref 8.9–10.3)
Chloride: 103 mmol/L (ref 98–111)
Creatinine, Ser: 1.14 mg/dL (ref 0.61–1.24)
GFR, Estimated: >60 mL/min (ref >60)
Glucose, Bld: 82 mg/dL (ref 70–99)
Potassium: 4 mmol/L (ref 3.5–5.1)
Sodium: 140 mmol/L (ref 135–145)
Total Bilirubin: 0.3 mg/dL (ref 0.0–1.2)
Total Protein: 6.3 g/dL — ABNORMAL LOW (ref 6.5–8.1)

## 2024-10-08 LAB — URINALYSIS, COMPLETE (UACMP) WITH MICROSCOPIC
Bacteria, UA: NONE SEEN
RBC / HPF: >50 RBC/hpf (ref 0–5)
Squamous Epithelial / HPF: 0 /HPF (ref 0–5)

## 2024-10-08 LAB — PROTIME-INR
INR: 1.1 (ref 0.8–1.2)
Prothrombin Time: 14.3 s (ref 11.4–15.2)

## 2024-10-08 LAB — CBC
HCT: 25.9 % — ABNORMAL LOW (ref 39.0–52.0)
Hemoglobin: 7.9 g/dL — ABNORMAL LOW (ref 13.0–17.0)
MCH: 29.9 pg (ref 26.0–34.0)
MCHC: 30.5 g/dL (ref 30.0–36.0)
MCV: 98.1 fL (ref 80.0–100.0)
Platelets: 234 K/uL (ref 150–400)
RBC: 2.64 MIL/uL — ABNORMAL LOW (ref 4.22–5.81)
RDW: 25.1 % — ABNORMAL HIGH (ref 11.5–15.5)
WBC: 8.8 K/uL (ref 4.0–10.5)
nRBC: 0 % (ref 0.0–0.2)

## 2024-10-08 LAB — APTT: aPTT: 35 s (ref 24–36)

## 2024-10-10 LAB — URINE CULTURE: Culture: NO GROWTH

## 2024-10-11 ENCOUNTER — Telehealth: Payer: Self-pay

## 2024-10-11 NOTE — Telephone Encounter (Signed)
 Patient called in today. States that he has been having clots in urine and feels like he should go to ED, since we will already be there for his surgery tomorrow. Spoke with Dr. Georganne. Dr. Georganne advised that patient condition should improve tomorrow with Nephrectomy. Patient advised that he should try to avoid the ED today unless truly an emergency. Patient has been having similar symptoms for weeks. Advised patient to call between 1-3pm today for his arrival time. Patient verbalized understanding.

## 2024-10-11 NOTE — Op Note (Signed)
 Date of procedure: 10/11/2024  Preoperative diagnosis:  Right upper tract urothelial carcinoma    Postoperative diagnosis:  Right upper tract urothelial carcinoma     Procedure: Right robotic radical nephro-urerectomy w/ bladder cuff excision Cystorrhaphy  Retroperitoneal lymph node dissection (hilar and paracaval) Instillation of intravesical Gemcitabine  Cystoscopy   Surgeon: [name], MD  Assistant: Redell Burnet, mD - required due to surgical case complexity,radical oncologic resection, required for bedside assisting, tissue manipulation, instrumentation and critical robotic handling  Anesthesia: General  - 1 unit packed RBC was given intraoperatively  Complications: None  Intraoperative findings:  Successful Right nephroureterectomy with bladder cuff.  Right ureteral stents were not visible in distal ureter or bladder-- likely migrated proximally into mid ureter and sent with final specimen  Significant intravesical bladder clot pre-op- hand irrigated intra-op with mild residual old clot remaining  Paracaval + perihilar RPLND performed 2g intravesical Gemcitabine  instilled w/ 1 hour dwell time  Final cystoscopy-- watertight bladder closure, mild intravesical old clot, unclear if new bladder tumor or debris along posterior floor- requires attention w/ follow up cystoscopy   EBL: 150cc  Specimens:  Right kidney, ureter and bladder cuff Paracaval + perihilar LNs   Drains:  RUQ #10 JP drain  68F 2-way indwelling Foley catheter  Indication: Stephen Bennett. is a 83 y.o. patient with:  HGT1 Right distal UTUC (dx 07/22/24)   - tumor extension above pelvic brim, unresectable from endoscopic approach  - CTU (08/21/24) - normal Left kidney, no regional LNA or metastatic disease   - x2 indwelling Right ureteral stents (one is malpositioned, proximal to tumor burden)   After reviewing the management options for treatment, they elected to proceed with the above  surgical procedure(s). We have discussed the potential benefits and risks of the procedure, side effects of the proposed treatment, the likelihood of the patient achieving the goals of the procedure, and any potential problems that might occur during the procedure or recuperation. Informed consent has been obtained.  Description of procedure: Following successful induction, the patient was given IV Ancef  and positioned in the modified right flank lateral position.  He was prepped and draped in usual standard fashion.  A final presurgical timeout was performed with all team members present.  We began with Veress entry and achievement of pneumoperitoneum to 15 mmHg in the right upper quadrant along the midclavicular line.  An 8 mm robotic port was placed without issue and the camera was inserted.  This revealed no intra-abdominal injuries.  He did have fairly significant right mesocolonic attachments to his sidewall.  Next we placed the remainder of our robotic and assistant ports under direct visualization.  A 12 mm assistant port was placed in a contralateral umbilical curvilinear location.  We also placed a 12 mm left handed robotic port for our stapler access.  Once all ports were successfully placed the robot was docked.   We began with meticulous sharp dissection of his intra-abdominal bowel adhesions with the right mesocolon and abdominal wall. It was then quite evident he had a significantly distended mid to proximal ureter which distorted anatomy.  This did serve as a useful landmark.  We were able to dissect posterior to the ureter and develop a standard plane along the psoas belly, thus elevating the right kidney.  We identified the anterior border of the vena cava as well as the gonadal vein insertion.  Just cephalad we encountered the right renal hilum which incorporated a single renal artery and single renal vein.  The renal hilum was stapled separately with individual 45 mm vascular loads via  robotic stapler.  Hemostasis was excellent.  Of note, during elevation of our kidney posteriorly there was an inadvertent aperture created in the renal pelvis.  The tissue appeared quite distended and diseased and there was immediate dark old hematuric effluent.  This was thoroughly irrigated and suctioned from the surgical field.  Next we continued our dissection releasing the hepatorenal ligaments and laterally.  Prior to complete lateral release of the kidney we transitioned to our retroperitoneal lymph node dissection.  We performed a right peri hilar and pericaval lymph node dissection which was taken as a single contiguous packet and sent as separate pathologic specimen.  At this point we completed our kidney dissection releasing the origin laterally and dividing the last of the tail of Gerota's fascia.  Notably, prior to transitioning to the ureteral dissection.  We had instilled 2 g of intravesical gemcitabine  into the bladder.  This dwell for 1 hour prior to evacuation.  It was noted, that he had gross hematuria with significant clots prior to placement of gemcitabine .  We had upsized him to a 24 French catheter and manually and irrigated out 200 cc of old clot.   We then repositioned our robotic boom targeting the right hemipelvis and distal ureter.  The right ureter was circumferentially dissected and carried down deep into the pelvis.  The distal right ureter was clipped with a Hem-o-lok. we were able to identify the iliac vessels which were carefully preserved.  The crossing superior vesicle artery was noted and fully coagulated with bipolar.  With the bladder fully drained we were able to apply cephalad pressure on the right ureter to continue our dissection through the intramural portion.  Once we identified detrusor fibers we carefully excised a bladder cuff, fully incorporating the right ureteral orifice.  A 2-0 Vicryl stay stitch was placed prior to full transection of the ureter.  Notably, we  did not visualize either of the indwelling right ureteral stents within the distal ureter or inside the bladder. Once the ureter and kidney unit were transected these were removed cephalad until they were placed in an Endo Catch bag.  Next we ran a 3-0 STRATAFIX stitch as a mucosal apposition layer for bladder closure.  This was followed by running our 2-0 Vicryl stitch as a detrusor strength layer.  Next we irrigated the pelvis and assured hemostasis.  Next, we performed flexible cystoscopy to evaluate the integrity of our repair, evaluate clot burden, as well as evaluate for any distantly passed ureteral stents.  Cystoscopy revealed a mild amount of old clot burden.  No ureteral stents were visualized in the bladder.  Visualization was challenging due to hematuria, although we did note a very small subcentimeter lesion along the posterior bladder wall-which was unclear if a small bladder primary tumor versus debris from resection.  Within the abdomen we are able to evaluate as the bladder filled from cystoscopy to approximately 200 cc volume- our cystorrhaphy was watertight without any leakage.   Surgicel and Surgiflo were placed in the right hemipelvis along the bladder repair.  Surgical snow was also placed along her hilar staple line.  We reinspected and irrigated the renal fossa which was hemostatic.  Our specimen was placed in an Endo Catch bag and all instruments were removed.  A #10 flat JP drain was introduced via the right upper quadrant robotic port and affixed to the skin with a nonabsorbable stitch.  We extracted  our specimen by extending our curvilinear incision inferiorly.  The extraction site fascia was closed with a running 1 PDS suture followed by 3-0 Vicryl for deep dermals and 4-0 Monocryl for skin.  The 12 mm robotic assistant port was closed with an 0 Vicryl UR 6 needle in a figure-of-eight fashion.  All other robotic ports were closed with 4-0 Monocryl and Dermabond.  Disposition:  Stable to PACU  Plan: - Admit to MedSurg - CBC and BMP in the PACU  - Patient received 1 unit packed RBC intraoperatively (starting hemoglobin was 7.9)  - Indwelling Foley catheter to remain through discharge, 10-14 days.  Expect ongoing mild hematuria which is secondary to old bladder clot.  He does not have any ongoing new bleeding.  - Will evaluate JP drain for removal postop day 1 versus postop day 2  [name], MD

## 2024-10-12 ENCOUNTER — Inpatient Hospital Stay: Admitting: Anesthesiology

## 2024-10-12 ENCOUNTER — Other Ambulatory Visit: Payer: Self-pay

## 2024-10-12 ENCOUNTER — Inpatient Hospital Stay
Admission: RE | Admit: 2024-10-12 | Discharge: 2024-10-14 | DRG: 654 | Disposition: A | Attending: Urology | Admitting: Urology

## 2024-10-12 ENCOUNTER — Encounter: Payer: Self-pay | Admitting: Urology

## 2024-10-12 ENCOUNTER — Encounter: Admission: RE | Disposition: A | Payer: Self-pay | Source: Home / Self Care | Attending: Urology

## 2024-10-12 DIAGNOSIS — J439 Emphysema, unspecified: Secondary | ICD-10-CM | POA: Diagnosis present

## 2024-10-12 DIAGNOSIS — D0919 Carcinoma in situ of other urinary organs: Secondary | ICD-10-CM

## 2024-10-12 DIAGNOSIS — N1330 Unspecified hydronephrosis: Secondary | ICD-10-CM | POA: Diagnosis present

## 2024-10-12 DIAGNOSIS — R32 Unspecified urinary incontinence: Secondary | ICD-10-CM | POA: Diagnosis present

## 2024-10-12 DIAGNOSIS — C641 Malignant neoplasm of right kidney, except renal pelvis: Secondary | ICD-10-CM | POA: Diagnosis present

## 2024-10-12 DIAGNOSIS — C661 Malignant neoplasm of right ureter: Secondary | ICD-10-CM | POA: Diagnosis present

## 2024-10-12 DIAGNOSIS — F17210 Nicotine dependence, cigarettes, uncomplicated: Secondary | ICD-10-CM | POA: Diagnosis present

## 2024-10-12 DIAGNOSIS — K660 Peritoneal adhesions (postprocedural) (postinfection): Secondary | ICD-10-CM | POA: Diagnosis present

## 2024-10-12 DIAGNOSIS — N3281 Overactive bladder: Secondary | ICD-10-CM | POA: Diagnosis present

## 2024-10-12 DIAGNOSIS — R310 Gross hematuria: Secondary | ICD-10-CM | POA: Diagnosis present

## 2024-10-12 DIAGNOSIS — C689 Malignant neoplasm of urinary organ, unspecified: Principal | ICD-10-CM | POA: Diagnosis present

## 2024-10-12 HISTORY — PX: BLADDER INSTILLATION: SHX6893

## 2024-10-12 HISTORY — PX: CYSTOSCOPY: SHX5120

## 2024-10-12 HISTORY — PX: ROBOT ASSITED LAPAROSCOPIC NEPHROURETERECTOMY: SHX6077

## 2024-10-12 HISTORY — PX: LAPAROSCOPIC TOTAL PELVIC LYMPHADENECTOMY: SHX7605

## 2024-10-12 LAB — CBC
HCT: 24.4 % — ABNORMAL LOW (ref 39.0–52.0)
Hemoglobin: 7.7 g/dL — ABNORMAL LOW (ref 13.0–17.0)
MCH: 30.7 pg (ref 26.0–34.0)
MCHC: 31.6 g/dL (ref 30.0–36.0)
MCV: 97.2 fL (ref 80.0–100.0)
Platelets: 255 K/uL (ref 150–400)
RBC: 2.51 MIL/uL — ABNORMAL LOW (ref 4.22–5.81)
RDW: 22.9 % — ABNORMAL HIGH (ref 11.5–15.5)
WBC: 20.7 K/uL — ABNORMAL HIGH (ref 4.0–10.5)
nRBC: 0 % (ref 0.0–0.2)

## 2024-10-12 LAB — PREPARE RBC (CROSSMATCH)

## 2024-10-12 LAB — BASIC METABOLIC PANEL WITH GFR
Anion gap: 10 (ref 5–15)
BUN: 32 mg/dL — ABNORMAL HIGH (ref 8–23)
CO2: 24 mmol/L (ref 22–32)
Calcium: 8.3 mg/dL — ABNORMAL LOW (ref 8.9–10.3)
Chloride: 104 mmol/L (ref 98–111)
Creatinine, Ser: 1.63 mg/dL — ABNORMAL HIGH (ref 0.61–1.24)
GFR, Estimated: 41 mL/min — ABNORMAL LOW (ref >60)
Glucose, Bld: 128 mg/dL — ABNORMAL HIGH (ref 70–99)
Potassium: 4.4 mmol/L (ref 3.5–5.1)
Sodium: 138 mmol/L (ref 135–145)

## 2024-10-12 SURGERY — NEPHROURETERECTOMY, ROBOT-ASSISTED, LAPAROSCOPIC
Anesthesia: General | Site: Flank | Laterality: Right

## 2024-10-12 MED ORDER — ONDANSETRON HCL 4 MG/2ML IJ SOLN
INTRAMUSCULAR | Status: AC
  Filled 2024-10-12: qty 2

## 2024-10-12 MED ORDER — PHENYLEPHRINE 80 MCG/ML (10ML) SYRINGE FOR IV PUSH (FOR BLOOD PRESSURE SUPPORT)
PREFILLED_SYRINGE | INTRAVENOUS | Status: AC
  Filled 2024-10-12: qty 10

## 2024-10-12 MED ORDER — GEMCITABINE CHEMO FOR BLADDER INSTILLATION 2000 MG
2000.0000 mg | Freq: Once | INTRAVENOUS | Status: DC
  Filled 2024-10-12: qty 52.6

## 2024-10-12 MED ORDER — DEXTROSE-SODIUM CHLORIDE 5-0.45 % IV SOLN
INTRAVENOUS | Status: AC
  Filled 2024-10-12: qty 1000

## 2024-10-12 MED ORDER — SURGIFLO WITH THROMBIN (HEMOSTATIC MATRIX KIT) OPTIME
TOPICAL | Status: DC | PRN
  Administered 2024-10-12: 15:00:00 1 via TOPICAL

## 2024-10-12 MED ORDER — ONDANSETRON HCL 4 MG/2ML IJ SOLN: 4.0000 mg | Freq: Once | INTRAMUSCULAR | Status: DC | PRN

## 2024-10-12 MED ORDER — ACETAMINOPHEN 10 MG/ML IV SOLN
INTRAVENOUS | Status: AC
  Filled 2024-10-12: qty 100

## 2024-10-12 MED ORDER — ONDANSETRON HCL 4 MG/2ML IJ SOLN: 4.0000 mg | INTRAMUSCULAR | Status: DC | PRN

## 2024-10-12 MED ORDER — CHLORHEXIDINE GLUCONATE 0.12 % MT SOLN
15.0000 mL | Freq: Once | OROMUCOSAL | Status: AC
  Administered 2024-10-12: 12:00:00 15 mL via OROMUCOSAL

## 2024-10-12 MED ORDER — CEFAZOLIN SODIUM-DEXTROSE 2-4 GM/100ML-% IV SOLN
2.0000 g | INTRAVENOUS | Status: AC
  Administered 2024-10-12: 14:00:00 2 g via INTRAVENOUS

## 2024-10-12 MED ORDER — OXYCODONE HCL 5 MG PO TABS: 5.0000 mg | ORAL_TABLET | Freq: Once | ORAL | Status: DC | PRN

## 2024-10-12 MED ORDER — STERILE WATER FOR IRRIGATION IR SOLN
Status: DC | PRN
  Administered 2024-10-12: 15:00:00 500 mL

## 2024-10-12 MED ORDER — CEFAZOLIN SODIUM-DEXTROSE 2-4 GM/100ML-% IV SOLN
INTRAVENOUS | Status: AC
  Filled 2024-10-12: qty 100

## 2024-10-12 MED ORDER — FENTANYL CITRATE (PF) 100 MCG/2ML IJ SOLN
INTRAMUSCULAR | Status: AC
  Filled 2024-10-12: qty 2

## 2024-10-12 MED ORDER — PHENYLEPHRINE 80 MCG/ML (10ML) SYRINGE FOR IV PUSH (FOR BLOOD PRESSURE SUPPORT)
PREFILLED_SYRINGE | INTRAVENOUS | Status: DC | PRN
  Administered 2024-10-12: 17:00:00 120 ug via INTRAVENOUS
  Administered 2024-10-12: 17:00:00 80 ug via INTRAVENOUS
  Administered 2024-10-12: 14:00:00 160 ug via INTRAVENOUS

## 2024-10-12 MED ORDER — OXYCODONE HCL 5 MG PO TABS
5.0000 mg | ORAL_TABLET | ORAL | Status: DC | PRN
  Administered 2024-10-13: 03:00:00 10 mg via ORAL
  Filled 2024-10-12: qty 2

## 2024-10-12 MED ORDER — SODIUM CHLORIDE 0.9 % IV SOLN: INTRAVENOUS | Status: DC | PRN

## 2024-10-12 MED ORDER — FENTANYL CITRATE (PF) 100 MCG/2ML IJ SOLN
25.0000 ug | INTRAMUSCULAR | Status: DC | PRN
  Administered 2024-10-12: 20:00:00 25 ug via INTRAVENOUS

## 2024-10-12 MED ORDER — CHLORHEXIDINE GLUCONATE 4 % EX SOLN: 60.0000 mL | Freq: Once | CUTANEOUS | Status: DC

## 2024-10-12 MED ORDER — ROCURONIUM BROMIDE 10 MG/ML (PF) SYRINGE
PREFILLED_SYRINGE | INTRAVENOUS | Status: AC
  Filled 2024-10-12: qty 10

## 2024-10-12 MED ORDER — ORAL CARE MOUTH RINSE: 15.0000 mL | Freq: Once | OROMUCOSAL | Status: AC

## 2024-10-12 MED ORDER — SUGAMMADEX SODIUM 200 MG/2ML IV SOLN
INTRAVENOUS | Status: DC | PRN
  Administered 2024-10-12: 18:00:00 200 mg via INTRAVENOUS

## 2024-10-12 MED ORDER — OXYCODONE HCL 5 MG PO TABS: 5.0000 mg | ORAL_TABLET | ORAL | Status: DC | PRN

## 2024-10-12 MED ORDER — EPHEDRINE 5 MG/ML INJ
INTRAVENOUS | Status: AC
  Filled 2024-10-12: qty 5

## 2024-10-12 MED ORDER — LIDOCAINE HCL (CARDIAC) PF 100 MG/5ML IV SOSY
PREFILLED_SYRINGE | INTRAVENOUS | Status: DC | PRN
  Administered 2024-10-12: 14:00:00 60 mg via INTRAVENOUS

## 2024-10-12 MED ORDER — CHLORHEXIDINE GLUCONATE 0.12 % MT SOLN
OROMUCOSAL | Status: AC
  Filled 2024-10-12: qty 15

## 2024-10-12 MED ORDER — OXYCODONE HCL 5 MG/5ML PO SOLN: 5.0000 mg | Freq: Once | ORAL | Status: DC | PRN

## 2024-10-12 MED ORDER — SENNOSIDES-DOCUSATE SODIUM 8.6-50 MG PO TABS
2.0000 | ORAL_TABLET | Freq: Every day | ORAL | Status: DC
  Administered 2024-10-13: 23:00:00 2 via ORAL
  Filled 2024-10-12 (×2): qty 2

## 2024-10-12 MED ORDER — EPHEDRINE SULFATE-NACL 50-0.9 MG/10ML-% IV SOSY
PREFILLED_SYRINGE | INTRAVENOUS | Status: DC | PRN
  Administered 2024-10-12: 14:00:00 10 mg via INTRAVENOUS

## 2024-10-12 MED ORDER — BUPIVACAINE LIPOSOME 1.3 % IJ SUSP
INTRAMUSCULAR | Status: AC
  Filled 2024-10-12: qty 20

## 2024-10-12 MED ORDER — FENTANYL CITRATE (PF) 100 MCG/2ML IJ SOLN
INTRAMUSCULAR | Status: DC | PRN
  Administered 2024-10-12 (×2): 50 ug via INTRAVENOUS

## 2024-10-12 MED ORDER — ACETAMINOPHEN 10 MG/ML IV SOLN
INTRAVENOUS | Status: DC | PRN
  Administered 2024-10-12: 17:00:00 1000 mg via INTRAVENOUS

## 2024-10-12 MED ORDER — OXYBUTYNIN CHLORIDE 5 MG PO TABS: 5.0000 mg | ORAL_TABLET | Freq: Three times a day (TID) | ORAL | Status: DC | PRN

## 2024-10-12 MED ORDER — ACETAMINOPHEN 500 MG PO TABS
1000.0000 mg | ORAL_TABLET | Freq: Four times a day (QID) | ORAL | Status: DC
  Administered 2024-10-12: 1000 mg via ORAL
  Filled 2024-10-12: qty 2

## 2024-10-12 MED ORDER — GEMCITABINE CHEMO FOR BLADDER INSTILLATION 2000 MG
INTRAVENOUS | Status: DC | PRN
  Administered 2024-10-12: 15:00:00 2000 mg via INTRAVESICAL

## 2024-10-12 MED ORDER — BUPIVACAINE LIPOSOME 1.3 % IJ SUSP
INTRAMUSCULAR | Status: DC | PRN
  Administered 2024-10-12: 17:00:00 50 mL via INTRAMUSCULAR

## 2024-10-12 MED ORDER — ROCURONIUM BROMIDE 100 MG/10ML IV SOLN
INTRAVENOUS | Status: DC | PRN
  Administered 2024-10-12: 15:00:00 30 mg via INTRAVENOUS
  Administered 2024-10-12: 14:00:00 50 mg via INTRAVENOUS
  Administered 2024-10-12: 17:00:00 20 mg via INTRAVENOUS

## 2024-10-12 MED ORDER — PROPOFOL 10 MG/ML IV BOLUS
INTRAVENOUS | Status: AC
  Filled 2024-10-12: qty 20

## 2024-10-12 MED ORDER — ONDANSETRON HCL 4 MG/2ML IJ SOLN
INTRAMUSCULAR | Status: DC | PRN
  Administered 2024-10-12: 17:00:00 4 mg via INTRAVENOUS

## 2024-10-12 MED ORDER — HEPARIN SODIUM (PORCINE) 5000 UNIT/ML IJ SOLN
5000.0000 [IU] | Freq: Three times a day (TID) | INTRAMUSCULAR | Status: DC
  Administered 2024-10-13 – 2024-10-14 (×4): 5000 [IU] via SUBCUTANEOUS
  Filled 2024-10-12 (×4): qty 1

## 2024-10-12 MED ORDER — LACTATED RINGERS IV SOLN: INTRAVENOUS | Status: DC

## 2024-10-12 MED ORDER — HEMOSTATIC AGENTS (NO CHARGE) OPTIME
TOPICAL | Status: DC | PRN
  Administered 2024-10-12: 15:00:00 1 via TOPICAL

## 2024-10-12 MED ORDER — PROPOFOL 10 MG/ML IV BOLUS
INTRAVENOUS | Status: DC | PRN
  Administered 2024-10-12: 14:00:00 150 mg via INTRAVENOUS

## 2024-10-12 MED ORDER — BUPIVACAINE HCL (PF) 0.5 % IJ SOLN
INTRAMUSCULAR | Status: AC
  Filled 2024-10-12: qty 30

## 2024-10-12 SURGICAL SUPPLY — 63 items
APPLICATOR ARISTA FLEXITIP XL (MISCELLANEOUS) ×2 IMPLANT
APPLICATOR SURGIFLO ENDO (HEMOSTASIS) ×2 IMPLANT
BAG LAPAROSCOPIC 12 15 PORT 16 (BASKET) ×2 IMPLANT
CATH FOL 2WAY LX 24X5 (CATHETERS) IMPLANT
CATH FOLEY 2WAY 5CC 20FR SIL (CATHETERS) IMPLANT
CHLORAPREP W/TINT 26 (MISCELLANEOUS) ×4 IMPLANT
CLIP APPLIE 5 13 M/L LIGAMAX5 (MISCELLANEOUS) IMPLANT
CLIP LIGATING HEM O LOK PURPLE (MISCELLANEOUS) ×8 IMPLANT
COVER TIP SHEARS 8 DVNC (MISCELLANEOUS) ×2 IMPLANT
DEFOGGER SCOPE WARM SEASHARP (MISCELLANEOUS) ×4 IMPLANT
DERMABOND ADVANCED .7 DNX12 (GAUZE/BANDAGES/DRESSINGS) ×4 IMPLANT
DRAIN CHANNEL JP 10FR RND 1/8 (MISCELLANEOUS) IMPLANT
DRAPE ARM DVNC X/XI (DISPOSABLE) ×8 IMPLANT
DRAPE COLUMN DVNC XI (DISPOSABLE) ×2 IMPLANT
DRAPE INCISE IOBAN 66X60 STRL (DRAPES) ×2 IMPLANT
ELECTRODE REM PT RTRN 9FT ADLT (ELECTROSURGICAL) ×2 IMPLANT
EVACUATOR DRAINAGE 10X20 100CC (DRAIN) IMPLANT
FORCEPS BPLR 8 MD DVNC XI (FORCEP) ×2 IMPLANT
FORCEPS BPLR FENES DVNC XI (FORCEP) ×2 IMPLANT
FORCEPS PROGRASP DVNC XI (FORCEP) ×2 IMPLANT
GLOVE BIOGEL PI IND STRL 7.0 (GLOVE) ×4 IMPLANT
GLOVE BIOGEL PI IND STRL 7.5 (GLOVE) ×2 IMPLANT
GOWN STRL REUS W/ TWL LRG LVL3 (GOWN DISPOSABLE) ×8 IMPLANT
GOWN STRL REUS W/ TWL XL LVL3 (GOWN DISPOSABLE) ×2 IMPLANT
GRASPER TIP-UP FEN DVNC XI (INSTRUMENTS) IMPLANT
HEMOSTAT ARISTA ABSORB 1G (HEMOSTASIS) ×2 IMPLANT
HEMOSTAT SURGICEL 2X14 (HEMOSTASIS) IMPLANT
IRRIGATION STRYKERFLOW (MISCELLANEOUS) ×2 IMPLANT
KIT PINK PAD W/HEAD ARM REST (MISCELLANEOUS) ×2 IMPLANT
LOOP VESSEL MAXI 1X406 RED (MISCELLANEOUS) IMPLANT
MANIFOLD NEPTUNE II (INSTRUMENTS) ×2 IMPLANT
NDL HYPO 22X1.5 SAFETY MO (MISCELLANEOUS) ×2 IMPLANT
NDL INSUFFLATION 14GA 120MM (NEEDLE) ×2 IMPLANT
NEEDLE HYPO 22X1.5 SAFETY MO (MISCELLANEOUS) ×2 IMPLANT
NEEDLE INSUFFLATION 14GA 120MM (NEEDLE) ×2 IMPLANT
OBTURATOR OPTICALSTD 8 DVNC (TROCAR) ×2 IMPLANT
PACK LAP CHOLECYSTECTOMY (MISCELLANEOUS) ×2 IMPLANT
PORT ACCESS TROCAR AIRSEAL 12 (TROCAR) ×2 IMPLANT
RELOAD STAPLE 45 2.5 WHT DVNC (STAPLE) IMPLANT
SCISSORS MNPLR CVD DVNC XI (INSTRUMENTS) ×2 IMPLANT
SEAL UNIV 5-12 XI (MISCELLANEOUS) ×8 IMPLANT
SET TRI-LUMEN FLTR TB AIRSEAL (TUBING) ×2 IMPLANT
SLEEVE Z-THREAD 5X100MM (TROCAR) IMPLANT
SOL .9 NS 3000ML IRR UROMATIC (IV SOLUTION) ×2 IMPLANT
SOLN STERILE WATER 500 ML (IV SOLUTION) ×2 IMPLANT
SOLUTION ELECTROSURG ANTI STCK (MISCELLANEOUS) ×2 IMPLANT
SPONGE T-LAP 18X18 ~~LOC~~+RFID (SPONGE) ×2 IMPLANT
STAPLER 45 SUREFORM DVNC (STAPLE) IMPLANT
STRAP SAFETY 5IN WIDE (MISCELLANEOUS) ×4 IMPLANT
SURGIFLO W/THROMBIN 8M KIT (HEMOSTASIS) ×2 IMPLANT
SUT MNCRL AB 4-0 PS2 18 (SUTURE) ×4 IMPLANT
SUT PDS #1 CTX NDL (SUTURE) ×2 IMPLANT
SUT STRATA 3-0 15 RB-1.5 (SUTURE) IMPLANT
SUT VIC AB 0 CT1 36 (SUTURE) ×2 IMPLANT
SUT VICRYL 0 UR6 27IN ABS (SUTURE) IMPLANT
SUTURE EHLN 3-0 FS-10 30 BLK (SUTURE) IMPLANT
SYRINGE TOOMEY IRRIG 70ML (MISCELLANEOUS) IMPLANT
TRAP FLUID SMOKE EVACUATOR (MISCELLANEOUS) ×2 IMPLANT
TRAY FOLEY SLVR 16FR LF STAT (SET/KITS/TRAYS/PACK) ×2 IMPLANT
TROCAR Z THRD FIOS 12X150 (TROCAR) IMPLANT
TROCAR Z-THREAD FIOS 12X100MM (TROCAR) IMPLANT
TROCAR Z-THREAD FIOS 5X100MM (TROCAR) IMPLANT
VALVE UROSEAL ADJ ENDO (VALVE) IMPLANT

## 2024-10-12 NOTE — Progress Notes (Signed)
 Per verbal report from PACU RN - patient to rest over night, do not walk until morning.

## 2024-10-12 NOTE — Anesthesia Postprocedure Evaluation (Signed)
 Anesthesia Post Note  Patient: Stephen Bennett.  Procedure(s) Performed: NEPHROURETERECTOMY, ROBOT-ASSISTED, LAPAROSCOPIC (Right: Flank) LAPAROSCOPIC TOTAL PELVIC LYMPHADENECTOMY (Right: Flank) INSTILLATION, BLADDER (Bladder) CYSTOSCOPY, FLEXIBLE (Bladder)  Patient location during evaluation: PACU Anesthesia Type: General Level of consciousness: awake and alert Pain management: pain level controlled Vital Signs Assessment: post-procedure vital signs reviewed and stable Respiratory status: spontaneous breathing, nonlabored ventilation and respiratory function stable Cardiovascular status: blood pressure returned to baseline and stable Postop Assessment: no apparent nausea or vomiting Anesthetic complications: no   No notable events documented.   Last Vitals:  Vitals:   10/12/24 1915 10/12/24 1949  BP: (!) 111/46 (!) 113/57  Pulse: (!) 51 67  Resp: 14 14  Temp:    SpO2: 97% 98%    Last Pain:  Vitals:   10/12/24 1930  TempSrc:   PainSc: 5                  Fairy POUR Daja Shuping

## 2024-10-12 NOTE — Anesthesia Procedure Notes (Signed)
 Procedure Name: Intubation Date/Time: 10/12/2024 1:58 PM  Performed by: Niki Manus SAUNDERS, CRNAPre-anesthesia Checklist: Patient identified, Patient being monitored, Timeout performed, Emergency Drugs available and Suction available Patient Re-evaluated:Patient Re-evaluated prior to induction Oxygen Delivery Method: Circle system utilized Preoxygenation: Pre-oxygenation with 100% oxygen Induction Type: IV induction Ventilation: Mask ventilation without difficulty Laryngoscope Size: Mac and 4 Grade View: Grade I Tube type: Oral Tube size: 7.0 mm Number of attempts: 1 Airway Equipment and Method: Stylet Placement Confirmation: ETT inserted through vocal cords under direct vision, positive ETCO2 and breath sounds checked- equal and bilateral Secured at: 21 cm Tube secured with: Tape Dental Injury: Teeth and Oropharynx as per pre-operative assessment

## 2024-10-12 NOTE — Plan of Care (Signed)
 Patient verbalizes understanding of procedure, plan of care and discharge instructions

## 2024-10-12 NOTE — Transfer of Care (Signed)
 Immediate Anesthesia Transfer of Care Note  Patient: Stephen Bennett.  Procedure(s) Performed: NEPHROURETERECTOMY, ROBOT-ASSISTED, LAPAROSCOPIC (Right: Flank) LAPAROSCOPIC TOTAL PELVIC LYMPHADENECTOMY (Right: Flank) INSTILLATION, BLADDER (Bladder) CYSTOSCOPY, FLEXIBLE (Bladder)  Patient Location: PACU  Anesthesia Type:General  Level of Consciousness: drowsy  Airway & Oxygen Therapy: Patient Spontanous Breathing and Patient connected to face mask oxygen  Post-op Assessment: Report given to RN and Post -op Vital signs reviewed and stable  Post vital signs: Reviewed and stable  Last Vitals:  Vitals Value Taken Time  BP 125/51 10/12/24 17:52  Temp    Pulse 53 10/12/24 17:56  Resp 11 10/12/24 17:56  SpO2 100 % 10/12/24 17:56  Vitals shown include unfiled device data.  Last Pain:  Vitals:   10/12/24 1135  TempSrc: Temporal  PainSc: 0-No pain         Complications: No notable events documented.

## 2024-10-12 NOTE — Progress Notes (Signed)
 Informed Dr Georganne of Lab results.

## 2024-10-12 NOTE — Interval H&P Note (Signed)
 History and Physical Interval Note:  10/12/2024 1:22 PM  Stephen FORBES Dietrich Bennett.  has presented today for surgery, with the diagnosis of Right Ureteral Cancer.  The various methods of treatment have been discussed with the patient and family. After consideration of risks, benefits and other options for treatment, the patient has consented to  Procedures: NEPHROURETERECTOMY, ROBOT-ASSISTED, LAPAROSCOPIC (Right) LAPAROSCOPIC TOTAL PELVIC LYMPHADENECTOMY (Right) INSTILLATION, BLADDER (N/A) as a surgical intervention.  The patient's history has been reviewed, patient examined, no change in status, stable for surgery.  I have reviewed the patient's chart and labs.  Questions were answered to the patient's satisfaction.    General: no acute distress, alert/oriented, conversational  HEENT: equal nondilated pupils CV: regular rate Lung: unlabored breathing, regular rate and rhythm  Abd: nondistended, nontender with palpation, no palpable masses  MSK: moving all extremities without issue, normal observed motor function  Proceed to OR for: Right robotic radical nephro-ureterectomy, RPLND, instillation of intravesical Gemcitabine    [name]

## 2024-10-12 NOTE — Anesthesia Preprocedure Evaluation (Addendum)
 Anesthesia Evaluation  Patient identified by MRN, date of birth, ID band Patient awake    Reviewed: Allergy & Precautions, NPO status , Patient's Chart, lab work & pertinent test results  Airway Mallampati: II  TM Distance: >3 FB Neck ROM: full    Dental  (+) Edentulous Upper, Edentulous Lower   Pulmonary COPD, Current Smoker and Patient abstained from smoking.   Pulmonary exam normal  + decreased breath sounds+ wheezing      Cardiovascular Exercise Tolerance: Good negative cardio ROS Normal cardiovascular exam Rhythm:Regular Rate:Normal     Neuro/Psych   Anxiety Depression    negative neurological ROS  negative psych ROS   GI/Hepatic negative GI ROS, Neg liver ROS,,,  Endo/Other  negative endocrine ROSdiabetes, Oral Hypoglycemic Agents    Renal/GU Renal disease  negative genitourinary   Musculoskeletal   Abdominal   Peds negative pediatric ROS (+)  Hematology negative hematology ROS (+) Blood dyscrasia, anemia   Anesthesia Other Findings Past Medical History: 03/27/2023: Actinic keratosis     Comment:  left pretibial, LN2 04/15/23 No date: Anxiety 06/11/2018: Basal cell carcinoma     Comment:  L zygoma - excision 10/13/2018 06/11/2018: BCC (basal cell carcinoma of skin)     Comment:  L cheek 5.0 cm ant to the earlobe No date: Centrilobular emphysema (HCC) No date: Chronic low back pain No date: Chronic narcotic dependence (HCC) No date: Cigarette smoker No date: Compression fracture of fourth lumbar vertebra (HCC) No date: Depression No date: Emphysema lung (HCC) No date: Leg edema, right No date: Microcytic anemia No date: Myelodysplasia (myelodysplastic syndrome) (HCC) No date: Peripheral venous insufficiency No date: Right nephrolithiasis 09/18/2023: SCC (squamous cell carcinoma)     Comment:  left lateral leg ED&C done 09/18/23 04/28/2024: SCC (squamous cell carcinoma)     Comment:  left elbow -  treated with ED&C 04/02/2018: Squamous cell carcinoma of skin     Comment:  R distal lat calf - ED&C 01/31/2022: Squamous cell carcinoma of skin     Comment:  L med bicep - ED&C 06/12/2022: Squamous cell carcinoma of skin     Comment:  R temple - ED&C 07/03/2023: Squamous cell carcinoma of skin     Comment:  left medial calf, EDC 07/03/2023: Squamous cell carcinoma of skin     Comment:  right medial calf, EDC No date: Ureteral tumor  Past Surgical History: No date: COLONOSCOPY 08/24/2024: CYSTOSCOPY W/ RETROGRADES; Bilateral     Comment:  Procedure: CYSTOSCOPY, WITH RETROGRADE PYELOGRAM;                Surgeon: Georganne Penne SAUNDERS, MD;  Location: [facility];                Service: Urology;  Laterality: Bilateral; 07/22/2024: CYSTOSCOPY W/ URETERAL STENT PLACEMENT; Right     Comment:  Procedure: CYSTOSCOPY, WITH RETROGRADE PYELOGRAM AND               URETERAL STENT INSERTION;  Surgeon: Twylla Glendia BROCKS, MD;              Location: [facility];  Service: Urology;  Laterality:               Right; 05/31/2024: CYSTOSCOPY WITH BIOPSY; Right     Comment:  Procedure: CYSTOSCOPY, WITH BIOPSY;  Surgeon: Twylla Glendia BROCKS, MD;  Location: [facility];  Service: Urology;  Laterality: Right; 07/22/2024: CYSTOSCOPY WITH BIOPSY; Right     Comment:  Procedure: CYSTOSCOPY, WITH BIOPSY;  Surgeon: Twylla Glendia BROCKS, MD;  Location: [facility];  Service: Urology;                Laterality: Right; 05/31/2024: CYSTOSCOPY WITH FULGERATION; N/A     Comment:  Procedure: CYSTOSCOPY, WITH URETERAL FULGURATION;                Surgeon: Twylla Glendia BROCKS, MD;  Location: [facility];                Service: Urology;  Laterality: N/A;  possible bladder               biopsy and possible tumor resection 05/31/2024: CYSTOSCOPY WITH STENT PLACEMENT; Right     Comment:  Procedure: CYSTOSCOPY, WITH STENT INSERTION;  Surgeon:               Twylla Glendia BROCKS, MD;  Location: [facility];  Service:                Urology;  Laterality: Right; 08/24/2024: CYSTOSCOPY WITH STENT PLACEMENT; Right     Comment:  Procedure: CYSTOSCOPY, WITH STENT INSERTION;  Surgeon:               Georganne Penne SAUNDERS, MD;  Location: [facility];  Service:               Urology;  Laterality: Right; 05/31/2024: CYSTOSCOPY/URETEROSCOPY/HOLMIUM LASER; Right     Comment:  Procedure: CYSTOURETEROSCOPY, USING HOLMIUM LASER;                Surgeon: Twylla Glendia BROCKS, MD;  Location: [facility];                Service: Urology;  Laterality: Right; 07/22/2024: CYSTOSCOPY/URETEROSCOPY/HOLMIUM LASER/STENT PLACEMENT;  Right     Comment:  Procedure: CYSTOSCOPY/URETEROSCOPY/HOLMIUM LASER/STENT               PLACEMENT;  Surgeon: Twylla Glendia BROCKS, MD;  Location:               [facility];  Service: Urology;  Laterality: Right; No date: SKIN CANCER EXCISION     Comment:  several 08/24/2024: URETEROSCOPY; Bilateral     Comment:  Procedure: URETEROSCOPY;  Surgeon: Georganne Penne SAUNDERS,               MD;  Location: [facility];  Service: Urology;  Laterality:               Bilateral;  BMI    Body Mass Index: 18.79 kg/m      Reproductive/Obstetrics negative OB ROS                              Anesthesia Physical Anesthesia Plan  ASA: 3  Anesthesia Plan: General   Post-op Pain Management:    Induction: Intravenous  PONV Risk Score and Plan: Ondansetron , Dexamethasone , Midazolam  and Treatment may vary due to age or medical condition  Airway Management Planned: Oral ETT  Additional Equipment:   Intra-op Plan:   Post-operative Plan: Extubation in OR  Informed Consent: I have reviewed the patients History and Physical, chart, labs and discussed the procedure including the risks, benefits and alternatives for the proposed anesthesia with the patient or authorized representative who has indicated his/her understanding and acceptance.  Dental Advisory Given  Plan Discussed with: CRNA  Anesthesia  Plan Comments:          Anesthesia Quick Evaluation

## 2024-10-12 NOTE — Progress Notes (Signed)
 Per Dr. Georganne, he is aware of foley catheter with bloody urine, do not call, do not flush catheter.  Patient is awake, lethargic but answers questions appropriately. Family at bedside.

## 2024-10-13 ENCOUNTER — Ambulatory Visit: Admitting: Dermatology

## 2024-10-13 LAB — CBC
HCT: 23.7 % — ABNORMAL LOW (ref 39.0–52.0)
HCT: 29.2 % — ABNORMAL LOW (ref 39.0–52.0)
Hemoglobin: 7.3 g/dL — ABNORMAL LOW (ref 13.0–17.0)
Hemoglobin: 9.4 g/dL — ABNORMAL LOW (ref 13.0–17.0)
MCH: 30.3 pg (ref 26.0–34.0)
MCH: 29.6 pg (ref 26.0–34.0)
MCHC: 30.8 g/dL (ref 30.0–36.0)
MCHC: 32.2 g/dL (ref 30.0–36.0)
MCV: 98.3 fL (ref 80.0–100.0)
MCV: 91.8 fL (ref 80.0–100.0)
Platelets: 244 K/uL (ref 150–400)
Platelets: 245 K/uL (ref 150–400)
RBC: 2.41 MIL/uL — ABNORMAL LOW (ref 4.22–5.81)
RBC: 3.18 MIL/uL — ABNORMAL LOW (ref 4.22–5.81)
RDW: 22.8 % — ABNORMAL HIGH (ref 11.5–15.5)
RDW: 23.7 % — ABNORMAL HIGH (ref 11.5–15.5)
WBC: 21.7 K/uL — ABNORMAL HIGH (ref 4.0–10.5)
WBC: 16.7 K/uL — ABNORMAL HIGH (ref 4.0–10.5)
nRBC: 0 % (ref 0.0–0.2)
nRBC: 0.2 % (ref 0.0–0.2)

## 2024-10-13 LAB — BASIC METABOLIC PANEL WITH GFR
Anion gap: 4 — ABNORMAL LOW (ref 5–15)
BUN: 34 mg/dL — ABNORMAL HIGH (ref 8–23)
CO2: 27 mmol/L (ref 22–32)
Calcium: 8.2 mg/dL — ABNORMAL LOW (ref 8.9–10.3)
Chloride: 105 mmol/L (ref 98–111)
Creatinine, Ser: 1.41 mg/dL — ABNORMAL HIGH (ref 0.61–1.24)
GFR, Estimated: 49 mL/min — ABNORMAL LOW (ref >60)
Glucose, Bld: 175 mg/dL — ABNORMAL HIGH (ref 70–99)
Potassium: 5 mmol/L (ref 3.5–5.1)
Sodium: 136 mmol/L (ref 135–145)

## 2024-10-13 LAB — PREPARE RBC (CROSSMATCH)

## 2024-10-13 MED ORDER — SODIUM CHLORIDE 0.9 % IV SOLN
1.0000 g | INTRAVENOUS | Status: DC
  Administered 2024-10-13 – 2024-10-14 (×2): 1 g via INTRAVENOUS
  Filled 2024-10-13 (×2): qty 10

## 2024-10-13 MED ORDER — TRAMADOL HCL 50 MG PO TABS
50.0000 mg | ORAL_TABLET | Freq: Four times a day (QID) | ORAL | Status: DC
  Administered 2024-10-13 (×2): 50 mg via ORAL
  Filled 2024-10-13 (×4): qty 1

## 2024-10-13 MED ORDER — SODIUM CHLORIDE 0.9% IV SOLUTION: Freq: Once | INTRAVENOUS | Status: AC

## 2024-10-13 MED ORDER — ACETAMINOPHEN 10 MG/ML IV SOLN
1000.0000 mg | Freq: Four times a day (QID) | INTRAVENOUS | Status: AC
  Administered 2024-10-13 – 2024-10-14 (×4): 1000 mg via INTRAVENOUS
  Filled 2024-10-13 (×3): qty 100

## 2024-10-13 MED ORDER — CALCIUM CARBONATE ANTACID 500 MG PO CHEW
1.0000 | CHEWABLE_TABLET | Freq: Three times a day (TID) | ORAL | Status: DC
  Administered 2024-10-13: 23:00:00 200 mg via ORAL
  Filled 2024-10-13 (×2): qty 1

## 2024-10-13 MED FILL — Acetaminophen IV Soln 10 MG/ML: INTRAVENOUS | Qty: 100 | Status: AC

## 2024-10-13 NOTE — Progress Notes (Signed)
° °  Interval: POD1  ~800cc dark red UOP overnight JP 140 s/s  Hgb 7.3 (from 7.7), WBC 21 from 20, Cr 1.4 (from 1.6)  Vitals - systolic a bit soft to 100s with 60-80 HR overnight, afebrile  Patient feeling okay this AM, conversational and alert Primarily having lower abdominal soreness- could not swallow Tylenol  pills overnight  Tolerated clears, has not walked yet  Physical Exam: BP (!) 100/45 (BP Location: Left Arm)   Pulse 78   Temp 97.8 F (36.6 C) (Oral)   Resp 15   Ht 5' 7 (1.702 m)   Wt 54.4 kg   SpO2 93%   BMI 18.79 kg/m    Constitutional:  Alert and oriented, No acute distress. Respiratory: Normal respiratory effort, no increased work of breathing. GI: Abdomen is soft, non-tender, non-distended. Incisions c/d/i GU: 51F catheter in place with dark red thin urine draining freely, no clots  Drains: RUQ JP bulb, s/s fluid in bulb Legs: stable mild BLE edema, chronic, not worsened since pre-op exam   Laboratory Data:  Latest Reference Range & Units 10/12/24 18:42 10/13/24 07:14  WBC 4.0 - 10.5 K/uL 20.7 (H) 21.7 (H)    Latest Reference Range & Units 10/08/24 14:39 10/12/24 18:42 10/13/24 07:14  Hemoglobin 13.0 - 17.0 g/dL 7.9 (L) 7.7 (L) 7.3 (L)    Latest Reference Range & Units 10/12/24 18:42 10/13/24 07:14  Creatinine 0.61 - 1.24 mg/dL 8.36 (H) 8.58 (H)  (H): Data is abnormally high  Pertinent Imaging: N/A  Assessment & Plan:   POD 1 from Right robotic nephro-ureterectomy w/ bladder cuff, RPLND  Pain - switched to IV ofirmev  since he cannot tolerated PO tyenol pills, continue PRN oxycodone , use ice packs liberally. May consider lidocaine  patch- although he does have sensitive skin   Pulm - incentive spirometer, needs at bedside. Encourage sitting upright in chair, ambulate 3-4x daily  Diet/fluids - advance to reg, drop mIVF to 75 until adequate PO intake, need to maintain higher UOP / thin hematuria   - attention to BLE chronic edema / fluid management.  Holding home Lasix , low threshold to engage medicine consult if needed   Heme - Hgb 7.2 after receiving 1u pRBC in OR. Will transfuse 1 additional unit this morning. Likely post-op dilutional at this point, do not expect any ongoing bleed. Re-check PM post-transfusion CBC  ID - starting IV rocephin  this monring, WBC persistent elevation post-op, perhaps reactive although he did have intraoperative spillage of Right pelvic fluid, possibly obstructed/ infected hematuric material. Collect urine culture from cath  -GU - 51F catheter stays in place, expected dark hematuria which is old clot. He does not have any active tumor bleeding. No need to flush unless fully obstructed clot in tubing  - RUQ JP drain to stay in place through today, will remove prior to DC  Expect inpatient admission through today. Will reassess tomorrow AM Stephen JONELLE Skye, MD

## 2024-10-13 NOTE — Progress Notes (Signed)
 Brief Urology Progress Note  He received 1 additional unit PRBCs late this morning.  BP has normalized.  Foley catheter in place draining dark red urine.  He reports appropriate abdominal soreness.  Repeat CBC this afternoon is pending.  Antibiotics have been started.  Will reassess in the morning.  Shellie Rogoff, PA-C 10/13/2024  12:58 PM

## 2024-10-13 NOTE — Plan of Care (Signed)
°  Problem: Education: Goal: Knowledge of General Education information will improve Description: Including pain rating scale, medication(s)/side effects and non-pharmacologic comfort measures Outcome: Progressing   Problem: Education: Goal: Knowledge of the prescribed therapeutic regimen will improve Outcome: Progressing   Problem: Clinical Measurements: Goal: Postoperative complications will be avoided or minimized Outcome: Progressing   Problem: Skin Integrity: Goal: Demonstration of wound healing without infection will improve Outcome: Progressing   Problem: Urinary Elimination: Goal: Ability to avoid or minimize complications of infection will improve Outcome: Progressing

## 2024-10-14 ENCOUNTER — Other Ambulatory Visit: Payer: Self-pay

## 2024-10-14 ENCOUNTER — Telehealth: Payer: Self-pay

## 2024-10-14 ENCOUNTER — Encounter: Payer: Self-pay | Admitting: Urology

## 2024-10-14 LAB — TYPE AND SCREEN
ABO/RH(D): O POS
Antibody Screen: NEGATIVE
Unit division: 0
Unit division: 0

## 2024-10-14 LAB — BASIC METABOLIC PANEL WITH GFR
Anion gap: 9 (ref 5–15)
BUN: 37 mg/dL — ABNORMAL HIGH (ref 8–23)
CO2: 23 mmol/L (ref 22–32)
Calcium: 8 mg/dL — ABNORMAL LOW (ref 8.9–10.3)
Chloride: 100 mmol/L (ref 98–111)
Creatinine, Ser: 1.25 mg/dL — ABNORMAL HIGH (ref 0.61–1.24)
GFR, Estimated: 57 mL/min — ABNORMAL LOW (ref >60)
Glucose, Bld: 98 mg/dL (ref 70–99)
Potassium: 4.6 mmol/L (ref 3.5–5.1)
Sodium: 131 mmol/L — ABNORMAL LOW (ref 135–145)

## 2024-10-14 LAB — CBC
HCT: 28.2 % — ABNORMAL LOW (ref 39.0–52.0)
Hemoglobin: 9.1 g/dL — ABNORMAL LOW (ref 13.0–17.0)
MCH: 29.6 pg (ref 26.0–34.0)
MCHC: 32.3 g/dL (ref 30.0–36.0)
MCV: 91.9 fL (ref 80.0–100.0)
Platelets: 182 K/uL (ref 150–400)
RBC: 3.07 MIL/uL — ABNORMAL LOW (ref 4.22–5.81)
RDW: 23.3 % — ABNORMAL HIGH (ref 11.5–15.5)
WBC: 16.4 K/uL — ABNORMAL HIGH (ref 4.0–10.5)
nRBC: 0 % (ref 0.0–0.2)

## 2024-10-14 LAB — BPAM RBC
Blood Product Expiration Date: 202601152359
Blood Product Expiration Date: 202601162359
ISSUE DATE / TIME: 202512161504
ISSUE DATE / TIME: 202512170833
Unit Type and Rh: 5100
Unit Type and Rh: 5100

## 2024-10-14 LAB — URINE CULTURE: Culture: NO GROWTH

## 2024-10-14 MED ORDER — DOCUSATE SODIUM 100 MG PO CAPS
100.0000 mg | ORAL_CAPSULE | Freq: Two times a day (BID) | ORAL | 0 refills | Status: AC
  Filled 2024-10-14: qty 10, 5d supply, fill #0

## 2024-10-14 MED ORDER — SULFAMETHOXAZOLE-TRIMETHOPRIM 800-160 MG PO TABS
1.0000 | ORAL_TABLET | Freq: Two times a day (BID) | ORAL | 0 refills | Status: AC
  Filled 2024-10-14: qty 10, 5d supply, fill #0

## 2024-10-14 MED FILL — Oxycodone HCl Tab 5 MG: 5.0000 mg | ORAL | 2 days supply | Qty: 8 | Fill #0 | Status: AC

## 2024-10-14 NOTE — Plan of Care (Signed)
°  Problem: Education: Goal: Knowledge of General Education information will improve Description: Including pain rating scale, medication(s)/side effects and non-pharmacologic comfort measures Outcome: Progressing   Problem: Education: Goal: Knowledge of the prescribed therapeutic regimen will improve Outcome: Progressing   Problem: Bowel/Gastric: Goal: Gastrointestinal status for postoperative course will improve Outcome: Progressing   Problem: Respiratory: Goal: Ability to achieve and maintain a regular respiratory rate will improve Outcome: Progressing   Problem: Skin Integrity: Goal: Demonstration of wound healing without infection will improve Outcome: Progressing

## 2024-10-14 NOTE — Discharge Summary (Signed)
 Date of admission: 10/12/2024  Date of discharge: 10/14/2024  Admission diagnosis: Right upper tract urothelial carcinoma  Discharge diagnosis: Same as above  Secondary diagnoses:  Patient Active Problem List   Diagnosis Date Noted   Urothelial cancer (HCC) 10/12/2024   Establishing care with new doctor, encounter for 09/20/2024   Abnormal glucose 09/20/2024   Urinary leakage 09/17/2024   Urothelial carcinoma of kidney, right (HCC) 08/11/2024   Diabetic foot ulcer (HCC) 06/01/2024   Right nephrolithiasis 05/31/2024   ABLA (acute blood loss anemia) 05/31/2024   Tobacco use disorder 05/31/2024   AKI (acute kidney injury) 05/31/2024   Pain in pelvis 11/27/2023   Gross hematuria 11/27/2023   Myelodysplasia (myelodysplastic syndrome) (HCC) 06/26/2023   Acquired thrombophilia 05/08/2023   Chronic narcotic dependence (HCC) 05/08/2023   Emphysema of lung (HCC) 11/25/2019   Preventative health care 11/19/2018   Advance directive discussed with patient 11/19/2018   Osteoporosis 05/07/2018   Chronic low back pain 12/17/2017   Peripheral venous insufficiency 12/03/2017   History and Physical: For full details, please see admission history and physical. Briefly, Stephen Gehrig. is a 83 y.o. year old patient admitted on 10/12/2024 for scheduled right robotic radical nephro ureterectomy with bladder cuff excision, cystorrhaphy, and retroperitoneal lymph node dissection with Dr. Georganne for management of right upper tract urothelial carcinoma.   This morning, creatinine down, 1.25.  White count down, 16.4.  Hemoglobin stable, 9.1.  Foley catheter in place draining amber urine with nonobstructing clots in the tubing.  JP drain with 145 mL serosanguineous fluid output overnight.  He is afebrile, VSS.  He has been up out of bed and is sitting in a chair upon arrival this morning.  He states he feels well and his pain is well-controlled.  He would like to go home today.  He continues to tolerate  p.o. without nausea or vomiting.  I removed his JP drain at the bedside at midday and he tolerated this well.  We discussed that he would keep his Foley catheter in place until postop follow-up with Dr. Georganne.  Physical Exam: Constitutional:  Alert, no acute distress, nontoxic appearing HEENT: [place], AT Cardiovascular: No clubbing, cyanosis, or edema Respiratory: Normal respiratory effort, no increased work of breathing GI: Abdomen is soft with appropriate postoperative tenderness, no rigidity or rebound. Incisions clean/dry/intact with overlying surgical adhesive. Skin: No rashes, bruises or suspicious lesions Neurologic: Grossly intact, no focal deficits, moving all 4 extremities Psychiatric: Normal mood and affect   Hospital Course: Patient tolerated the procedure well.  He was then transferred to the floor after an uneventful PACU stay.  His hospital course was uncomplicated.  On POD#2 he had met discharge criteria: was eating a regular diet, was up and ambulating independently,  pain was well controlled, JP drain was removed, catheter was draining, and was ready for discharge.  Laboratory values:  Recent Labs    10/13/24 0714 10/13/24 1630 10/14/24 0552  WBC 21.7* 16.7* 16.4*  HGB 7.3* 9.4* 9.1*  HCT 23.7* 29.2* 28.2*   Recent Labs    10/12/24 1842 10/13/24 0714 10/14/24 0552  NA 138 136 131*  K 4.4 5.0 4.6  CL 104 105 100  CO2 24 27 23   GLUCOSE 128* 175* 98  BUN 32* 34* 37*  CREATININE 1.63* 1.41* 1.25*  CALCIUM  8.3* 8.2* 8.0*   Results for orders placed or performed during the hospital encounter of 10/12/24  Urine Culture (for pregnant, neutropenic or urologic patients or patients with an indwelling urinary  catheter)     Status: None   Collection Time: 10/13/24  8:12 AM   Specimen: Urine, Catheterized  Result Value Ref Range Status   Specimen Description   Final    URINE, CATHETERIZED Performed at [facility], [address], [place], [place] [address]     Special Requests   Final    NONE Performed at [facility], [address], [place], [place] [address]    Culture   Final    NO GROWTH Performed at [facility], [address], [place], [place] [address]    Report Status 10/14/2024 FINAL  Final   Disposition: Home  Discharge instruction: The patient was instructed to be ambulatory but told to refrain from heavy lifting, strenuous activity, or driving.  Catheter care instructions provided by nursing.  Discharge medications:  Allergies as of 10/14/2024   No Known Allergies      Medication List     TAKE these medications    calcium  carbonate 500 MG chewable tablet Commonly known as: TUMS - dosed in mg elemental calcium  Chew 1 tablet by mouth daily with supper.   cholecalciferol 25 MCG (1000 UNIT) tablet Commonly known as: VITAMIN D3 Take 1,000 Units by mouth daily.   cyanocobalamin  1000 MCG tablet Commonly known as: VITAMIN B12 Take 1,000 mcg by mouth daily with supper.   docusate sodium  100 MG capsule Commonly known as: Colace Take 1 capsule (100 mg total) by mouth 2 (two) times daily.   furosemide  20 MG tablet Commonly known as: LASIX  TAKE 1-2 TABLETS (20-40 MG TOTAL) BY MOUTH DAILY AS NEEDED. FOR INCREASED MORNING LEG SWELLING   oxyCODONE  5 MG immediate release tablet Commonly known as: Oxy IR/ROXICODONE  Take 1 tablet (5 mg total) by mouth every 6 (six) hours as needed for severe pain (pain score 7-10) or moderate pain (pain score 4-6).   sulfamethoxazole -trimethoprim  800-160 MG tablet Commonly known as: Bactrim  DS Take 1 tablet by mouth 2 (two) times daily for 5 days.   traMADol  50 MG tablet Commonly known as: ULTRAM  Take 1 tablet (50 mg total) by mouth 3 (three) times daily as needed.   trospium  20 MG tablet Commonly known as: SANCTURA  Take 1 tablet (20 mg total) by mouth 2 (two) times daily as needed (frequency,urgency,bladder spasm).       Followup:   Follow-up Information      Georganne Penne SAUNDERS, MD Follow up on 10/29/2024.   Specialty: Urology Why: For postop follow up and catheter removal Contact information: [address], [address] [place] [address] [phone]

## 2024-10-14 NOTE — Telephone Encounter (Signed)
 Per Dr.Garren and PA Sam patients post op appointment for catheter removal needed to be pushed up . I contacted pt and he was aware of his appt that was made 10/29/24 @11 :30am. Pt confirmed and had no further questions. Let Sam know appt has been scheduled.Stephen Bennett,CMA

## 2024-10-14 NOTE — Progress Notes (Signed)
 DISCHARGE NOTE:   Pt dc with IV removed and dc instructions given. Pt educated on incentive spirometer. Pt also educated on foley care and how to empty standard draining bag. Pt voices no questions or concerns at this time. Pt wheeled down to medical mal entrance by staff. Pt's friend provided transportation.

## 2024-10-14 NOTE — Plan of Care (Signed)
   Problem: Education: Goal: Knowledge of General Education information will improve Description Including pain rating scale, medication(s)/side effects and non-pharmacologic comfort measures Outcome: Progressing

## 2024-10-15 ENCOUNTER — Telehealth: Payer: Self-pay

## 2024-10-15 LAB — SURGICAL PATHOLOGY

## 2024-10-15 NOTE — Transitions of Care (Post Inpatient/ED Visit) (Signed)
" ° °  10/15/2024  Name: Stephen Bennett. MRN: [id number] DOB: Nov 22, 1940  Today's TOC FU Call Status: Today's TOC FU Call Status:: Unsuccessful Call (1st Attempt) Unsuccessful Call (1st Attempt) Date: 10/15/24  Attempted to reach the patient regarding the most recent Inpatient/ED visit.  Follow Up Plan: Additional outreach attempts will be made to reach the patient to complete the Transitions of Care (Post Inpatient/ED visit) call.   Arvin Seip RN, BSN, CCM [employer], Population Health Case Manager Phone: [phone]  "

## 2024-10-15 NOTE — Transitions of Care (Post Inpatient/ED Visit) (Signed)
" ° °  10/15/2024  Name: Stephen Bennett. MRN: [id number] DOB: 02/02/1941  Today's TOC FU Call Status: Today's TOC FU Call Status:: Unsuccessful Call (2nd Attempt) Unsuccessful Call (2nd Attempt) Date: 10/15/24  Attempted to reach the patient regarding the most recent Inpatient/ED visit.  Follow Up Plan: Additional outreach attempts will be made to reach the patient to complete the Transitions of Care (Post Inpatient/ED visit) call.   Arvin Seip RN, BSN, CCM [employer], Population Health Case Manager Phone: [phone]  "

## 2024-10-18 ENCOUNTER — Telehealth: Payer: Self-pay

## 2024-10-18 NOTE — Transitions of Care (Post Inpatient/ED Visit) (Signed)
" ° °  10/18/2024  Name: Stephen Bennett. MRN: [id number] DOB: 1940-12-12  Today's TOC FU Call Status: Today's TOC FU Call Status:: Unsuccessful Call (3rd Attempt) Unsuccessful Call (3rd Attempt) Date: 10/18/24  Attempted to reach the patient regarding the most recent Inpatient/ED visit.  Follow Up Plan: No further outreach attempts will be made at this time. We have been unable to contact the patient.  Alan Ee, RN, BSN, CEN Population Health- Transition of Care Team.  [facility] [phone]  "

## 2024-10-19 ENCOUNTER — Telehealth: Payer: Self-pay | Admitting: *Deleted

## 2024-10-19 ENCOUNTER — Ambulatory Visit: Admitting: Dermatology

## 2024-10-19 NOTE — Telephone Encounter (Signed)
 Patient called in today and ask about the 3 medication that was sent. Advised sulfamethoxazole  was his antibiotic . Colace is a stool softener . The oxycodone  is for pain . He understand now what they so and what is is taking them for .

## 2024-10-22 NOTE — Progress Notes (Signed)
" ° °  10/29/2024 11:57 AM   Stephen Bennett. 1941/01/08 [id number]  Reason for visit: Follow up Right nephroureterectomy   HPI: 83 y.o. male, follow up with me today  S/p Right robotic nephro-ureterectomy w/ bladder cuff, RPLND (10/12/24)   - Path = pT3N0 HG UTUC, -sm  Patient tells me he self removed his indwelling catheter on POD-POD5 Fortunately, he has had a very good postop recovery Urinating clear yellow urine, no clots Eating and drinking well, good energy levels-he states he feels remarkably well    Physical Exam: BP 104/62   Pulse 99   Ht 5' 7 (1.702 m)   Wt 115 lb 3.2 oz (52.3 kg)   BMI 18.04 kg/m    Constitutional:  Alert and oriented, No acute distress. GU: Abdominal incisions well-healed, nontender on exam, no abdominal distention.  No indwelling catheter present  Laboratory Data: REPORT OF SURGICAL PATHOLOGY   Accession #: [id number] Patient Name: Stephen Bennett Visit # : [id number]  MRN: [id number] Physician: Stephen Bennett DOB/Age [birth year] (Age: 60) Gender: M Collected Date: 10/12/2024 Received Date: 10/13/2024  FINAL DIAGNOSIS       1. Kidney, radical nephrectomy for tumor, right, ureter, and bladder cuff :      -  INVASIVE HIGH-GRADE UROTHELIAL CARCINOMA IN THE BACKGROUND OF CARCINOMA IN      SITU.      -  INVASIVE CARCINOMA INVOLVES MUSCULARIS PROPRIA AND SHOWS APPARENT INVOLVEMENT      OF PERIURETRAL SOFT TISSUE.      -  MARGINS NEGATIVE BUT CLOSE (INVASIVE 1 MM FROM BLADDER CUFF; CIS 2 MM FROM      BLADDER CUFF).      -  SEE NOTE AND SYNOPTIC REPORT BELOW       2. Lymph nodes, regional resection, pericaval :      -  PREDOMINANTLY FAT WITH SCATTERED LYMPHOID ELEMENTS, NEGATIVE FOR MALIGNANCY,      SEE NOTE   Pertinent Imaging: N/A    Assessment & Plan:    Urothelial cancer [facility]) Assessment & Plan: S/p Right robotic nephro-ureterectomy w/ bladder cuff, RPLND (10/12/24)   - intra-op Gemcitabine   - Path = pT3N0 HG UTUC,  -sm  Overall, recovering well from surgery, now ~2 weeks post-op  He self removed indwelling catheter on POD4-5- fortunately, seems to have healed well, intravesical clot resolved We reviewed his surgical pathology which did reveal high-grade advanced diseased, although reassuringly negative nodes and margins. Discussed possible cancer-specific outcome benefit with adjuvant chemo.   - Plan for office cystoscopy in 4-6 weeks, may need future TURBT if bladder lesion noted  - we noted a suspicious lesion during prior surgery, although was unclear if free-floating tumor debris vs new bladder mucosal lesion - Will refer to medical oncology re: options for adjuvant chemotherapy. Post-op GFR ~57, may be a candidate for cisplatin-based therapy. Although, I did explain that adj chemo may not portend overly impactful changes on his overall longevity, at risk of increased morbidity/QoL. He seems to be leaning against further treatment, which I think is reasonable.  - Surveillance plan: interval CT C/AP in 6 months + BMP   Orders: -     Ambulatory referral to Hematology / Oncology       Stephen Bennett Georganne, MD  [facility] [address], [address] [place], [place] [address] [phone] "

## 2024-10-22 NOTE — Assessment & Plan Note (Addendum)
 S/p Right robotic nephro-ureterectomy w/ bladder cuff, RPLND (10/12/24)   - intra-op Gemcitabine   - Path = pT3N0 HG UTUC, -sm  Overall, recovering well from surgery, now ~2 weeks post-op  He self removed indwelling catheter on POD4-5- and fortunately seems to have healed without complication, urine clear We reviewed his surgical pathology which did reveal high-grade advanced diseased, although reassuringly negative nodes and margins. Discussed possible cancer-specific outcome benefit with adjuvant chemo.   - Plan for office cystoscopy in 4-6 weeks, may need future TURBT if bladder lesion noted  - we noted a suspicious lesion during prior surgery, although was unclear if free-floating tumor debris vs new bladder mucosal lesion - Will refer to medical oncology re: options for adjuvant chemotherapy. Post-op GFR ~57, may be a candidate for cisplatin-based therapy. Although, I did explain that adj chemo may not portend overly impactful changes on his overall longevity, at risk of increased morbidity/QoL. He seems to be leaning against further treatment, which I think is reasonable.  - Surveillance plan: interval CT C/AP in 6 months + BMP

## 2024-10-29 ENCOUNTER — Encounter: Payer: Self-pay | Admitting: Urology

## 2024-10-29 ENCOUNTER — Ambulatory Visit: Admitting: Urology

## 2024-10-29 VITALS — BP 104/62 | HR 99 | Ht 67.0 in | Wt 115.2 lb

## 2024-10-29 DIAGNOSIS — C661 Malignant neoplasm of right ureter: Secondary | ICD-10-CM

## 2024-10-29 DIAGNOSIS — C641 Malignant neoplasm of right kidney, except renal pelvis: Secondary | ICD-10-CM

## 2024-10-29 DIAGNOSIS — C689 Malignant neoplasm of urinary organ, unspecified: Secondary | ICD-10-CM

## 2024-10-29 DIAGNOSIS — D0919 Carcinoma in situ of other urinary organs: Secondary | ICD-10-CM

## 2024-10-29 NOTE — Patient Instructions (Signed)

## 2024-11-01 ENCOUNTER — Telehealth: Payer: Self-pay | Admitting: Oncology

## 2024-11-01 NOTE — Telephone Encounter (Signed)
 Called pt and left him a vm about scheduling an appt with Dr. Georgina per referral request.

## 2024-11-02 ENCOUNTER — Ambulatory Visit: Admitting: Dermatology

## 2024-11-05 ENCOUNTER — Ambulatory Visit: Admitting: Urology

## 2024-11-09 ENCOUNTER — Telehealth: Payer: Self-pay

## 2024-11-09 NOTE — Telephone Encounter (Signed)
 Patient left a voicemail on the triage line reporting back pain and requesting a refill of hydrocodone .  LVM for pt to return call to triage symptoms.

## 2024-11-12 ENCOUNTER — Telehealth: Payer: Self-pay | Admitting: Oncology

## 2024-11-12 NOTE — Telephone Encounter (Signed)
 Returned call from vm - pt was asking to be seen by our clinic - called pt back and left vm asking him to get [facility] to send us  a referral for him so we can get him scheduled - LH

## 2024-11-12 NOTE — Telephone Encounter (Signed)
 Patient returned call to schedule appt from message left on 1/9 by Tenitia.

## 2024-11-17 NOTE — Telephone Encounter (Signed)
 Call transferred from front desk-   Pt is c/o of back pain and requesting hydrocodone  RF. Oxycodone  Last refill on 12/18 # 8 by SV. Pt is s/p surg with [facility] on 10/12/24.-Right robotic radical nephro-urerectomy w/ bladder cuff excision   Last Hydrocodone  at midnight.   Takes tramadol  occ. Rxed by pcp.   [facility] [address]-   Will send to [facility]. OK to leave vm as pt works 2nd shift.

## 2024-11-18 NOTE — Telephone Encounter (Signed)
 Pt aware. He will wait until 2/4 to address his pain with [facility].

## 2024-11-24 ENCOUNTER — Inpatient Hospital Stay: Admitting: Oncology

## 2024-11-24 ENCOUNTER — Telehealth: Payer: Self-pay | Admitting: Physician Assistant

## 2024-11-24 NOTE — Telephone Encounter (Signed)
 Pt is asking for a refill for Oxycodone  to be sent to [facility] on [address].  He is having lots of back pain.

## 2024-11-24 NOTE — Telephone Encounter (Signed)
 1st phone call attempted. Unsuccessful, detailed voicemail left clinic telephone number provided for a call back to get scheduled with Dr. Georganne to assess his pain.   (If patient calls in please schedule him with Dr. Georganne. Next available)

## 2024-11-24 NOTE — Telephone Encounter (Signed)
 Please offer him an office visit with Dr. Georganne to assess his pain.  No pain meds in the interim.

## 2024-11-25 NOTE — Progress Notes (Unsigned)
" ° °  12/01/2024 8:29 AM   Stephen Bennett Dietrich Mickey. [birth year] [id number]  Cystoscopy Procedure Note:  Indication:   S/p Right robotic nephro-ureterectomy w/ bladder cuff, RPLND (10/12/24)   - intra-op Gemcitabine   - Path = pT3N0 HG UTUC, -sm  Here for bladder surveillance  After informed consent and discussion of the procedure and its risks, Stephen Bennett. was positioned and prepped in the standard fashion. Cystoscopy was performed with a flexible cystoscope. The urethra, bladder neck and bladder mucosa were visualized in a systematic fashion. The ureteral orifices were noted in orthotopic location and orientation. There were no bladder mucosal lesions, stones, debris or anatomic variants noted. The prostate gland was {bglistcystoprostatefindings:33375}.  Imaging: Recent {bglistimagingtype:33376} reviewed - no GU abnormalities noted  Findings: ***  Assessment and Plan: ***  [name], MD 11/25/2024   "

## 2024-11-27 ENCOUNTER — Other Ambulatory Visit: Payer: Self-pay

## 2024-11-29 ENCOUNTER — Ambulatory Visit: Admitting: Dermatology

## 2024-11-30 ENCOUNTER — Inpatient Hospital Stay: Admitting: Oncology

## 2024-11-30 ENCOUNTER — Telehealth: Payer: Self-pay | Admitting: Oncology

## 2024-11-30 ENCOUNTER — Inpatient Hospital Stay

## 2024-11-30 NOTE — Telephone Encounter (Signed)
 Patient also called the triage line and left a vm requesting a return phone call to discuss his appointments. I had to leave a vm on his ph that call was being returned.

## 2024-11-30 NOTE — Telephone Encounter (Signed)
 I called the pt to see if he wants to rs his appts, the pt states he wants to know what he is being seen for and at this time he doesn't want to rs until someone calls him and let him know what he is being seen for.. please advise

## 2024-12-01 ENCOUNTER — Other Ambulatory Visit: Admitting: Urology

## 2024-12-01 NOTE — Telephone Encounter (Signed)
 I successfully reached out to patient again this morning. Caller verified using pt's full name and dob prior to discussing PHI   Per patient's request, discussed reason for the referral to the cancer center. Discussed that he was referred to us  from urology after his nephrectomy. He was dx with high-grade urothelial carcinoma and Dr. Jacobo will need to discuss treatment and follow-up recommendations. Patient gave verbal understanding and now agreeable to schedule a new patient appointment. He reports that he has transportation concerns. He relies on someone transporting him to work and to all medical appointments. He would prefer an afternoon NP apt to see Dr. Jacobo. However, he will need to call the scheduling line back after he can discuss the time off work with his employer and ride. Pt provided the number to the the referral line.

## 2024-12-03 ENCOUNTER — Telehealth: Payer: Self-pay

## 2024-12-03 NOTE — Telephone Encounter (Signed)
 Pt lm on triage line- requesting pain meds.   Pt aware AGAIN that no pain meds will be given w/o an appt. Offered appt but pt can not make times offered.   Advised pt to f/u with CC. Needs to see [place]. Pt voiced understanding.

## 2024-12-20 ENCOUNTER — Ambulatory Visit: Admitting: Dermatology

## 2024-12-21 ENCOUNTER — Encounter: Admitting: General Practice

## 2025-01-05 ENCOUNTER — Other Ambulatory Visit: Admitting: Urology

## 2025-07-05 ENCOUNTER — Ambulatory Visit
